# Patient Record
Sex: Female | Born: 1955 | Race: White | Hispanic: No | Marital: Single | Smoking: Current every day smoker
Health system: Southern US, Community
[De-identification: ages and names within clinical notes are randomized; demographics above are authoritative.]

## PROBLEM LIST (undated history)

## (undated) DIAGNOSIS — I890 Lymphedema, not elsewhere classified: Secondary | ICD-10-CM

## (undated) DIAGNOSIS — E785 Hyperlipidemia, unspecified: Secondary | ICD-10-CM

## (undated) DIAGNOSIS — I10 Essential (primary) hypertension: Secondary | ICD-10-CM

## (undated) DIAGNOSIS — C50919 Malignant neoplasm of unspecified site of unspecified female breast: Secondary | ICD-10-CM

## (undated) HISTORY — PX: BREAST LUMPECTOMY: SHX2

## (undated) HISTORY — DX: Malignant neoplasm of unspecified site of unspecified female breast: C50.919

## (undated) HISTORY — DX: Hyperlipidemia, unspecified: E78.5

## (undated) HISTORY — PX: TOTAL ABDOMINAL HYSTERECTOMY: SHX209

## (undated) HISTORY — DX: Essential (primary) hypertension: I10

## (undated) HISTORY — DX: Lymphedema, not elsewhere classified: I89.0

---

## 2008-04-04 ENCOUNTER — Ambulatory Visit: Payer: Self-pay | Admitting: Obstetrics & Gynecology

## 2008-04-26 DIAGNOSIS — C50919 Malignant neoplasm of unspecified site of unspecified female breast: Secondary | ICD-10-CM

## 2008-04-26 HISTORY — DX: Malignant neoplasm of unspecified site of unspecified female breast: C50.919

## 2008-09-10 ENCOUNTER — Encounter: Payer: Self-pay | Admitting: Cardiovascular Disease

## 2008-10-03 ENCOUNTER — Encounter: Payer: Self-pay | Admitting: Cardiovascular Disease

## 2008-12-06 ENCOUNTER — Encounter: Payer: Self-pay | Admitting: Cardiovascular Disease

## 2009-02-24 ENCOUNTER — Ambulatory Visit: Payer: Self-pay | Admitting: Radiation Oncology

## 2009-03-13 ENCOUNTER — Ambulatory Visit: Payer: Self-pay | Admitting: Radiation Oncology

## 2009-03-26 ENCOUNTER — Ambulatory Visit: Payer: Self-pay | Admitting: Radiation Oncology

## 2009-04-26 ENCOUNTER — Ambulatory Visit: Payer: Self-pay | Admitting: Radiation Oncology

## 2009-05-27 ENCOUNTER — Ambulatory Visit: Payer: Self-pay | Admitting: Radiation Oncology

## 2009-05-27 ENCOUNTER — Telehealth: Payer: Self-pay | Admitting: Cardiovascular Disease

## 2009-06-09 ENCOUNTER — Encounter: Payer: Self-pay | Admitting: Cardiovascular Disease

## 2009-06-11 ENCOUNTER — Ambulatory Visit: Payer: Self-pay | Admitting: Cardiovascular Disease

## 2009-06-11 DIAGNOSIS — I10 Essential (primary) hypertension: Secondary | ICD-10-CM | POA: Insufficient documentation

## 2009-06-11 DIAGNOSIS — R000 Tachycardia, unspecified: Secondary | ICD-10-CM | POA: Insufficient documentation

## 2009-06-23 ENCOUNTER — Ambulatory Visit: Payer: Self-pay | Admitting: Cardiovascular Disease

## 2009-06-24 ENCOUNTER — Ambulatory Visit: Payer: Self-pay | Admitting: Radiation Oncology

## 2009-07-02 ENCOUNTER — Telehealth: Payer: Self-pay | Admitting: Cardiovascular Disease

## 2009-07-02 LAB — CONVERTED CEMR LAB
ALT: 30 units/L (ref 0–35)
AST: 23 units/L (ref 0–37)
Albumin: 4.7 g/dL (ref 3.5–5.2)
Alkaline Phosphatase: 62 units/L (ref 39–117)
BUN: 15 mg/dL (ref 6–23)
CO2: 24 meq/L (ref 19–32)
Calcium: 9.5 mg/dL (ref 8.4–10.5)
Chloride: 104 meq/L (ref 96–112)
Cholesterol: 180 mg/dL (ref 0–200)
Creatinine, Ser: 0.56 mg/dL (ref 0.40–1.20)
Glucose, Bld: 99 mg/dL (ref 70–99)
HDL: 37 mg/dL — ABNORMAL LOW (ref >39)
Potassium: 3.8 meq/L (ref 3.5–5.3)
Sodium: 141 meq/L (ref 135–145)
Total Bilirubin: 0.3 mg/dL (ref 0.3–1.2)
Total CHOL/HDL Ratio: 4.9
Total Protein: 6.9 g/dL (ref 6.0–8.3)
Triglycerides: 405 mg/dL — ABNORMAL HIGH (ref <150)

## 2009-07-25 ENCOUNTER — Ambulatory Visit: Payer: Self-pay | Admitting: Radiation Oncology

## 2009-09-15 ENCOUNTER — Ambulatory Visit: Payer: Self-pay | Admitting: Cardiovascular Disease

## 2009-09-15 DIAGNOSIS — R079 Chest pain, unspecified: Secondary | ICD-10-CM | POA: Insufficient documentation

## 2009-10-07 ENCOUNTER — Telehealth: Payer: Self-pay | Admitting: Cardiovascular Disease

## 2009-10-22 ENCOUNTER — Telehealth: Payer: Self-pay | Admitting: Cardiovascular Disease

## 2009-11-24 ENCOUNTER — Ambulatory Visit: Payer: Self-pay | Admitting: Radiation Oncology

## 2009-12-02 ENCOUNTER — Ambulatory Visit: Payer: Self-pay | Admitting: Radiation Oncology

## 2009-12-25 ENCOUNTER — Ambulatory Visit: Payer: Self-pay | Admitting: Radiation Oncology

## 2010-03-23 ENCOUNTER — Ambulatory Visit: Payer: Self-pay | Admitting: Cardiovascular Disease

## 2010-03-24 ENCOUNTER — Ambulatory Visit: Payer: Self-pay | Admitting: Cardiovascular Disease

## 2010-03-24 LAB — CONVERTED CEMR LAB
Cholesterol: 152 mg/dL (ref 0–200)
HDL: 33 mg/dL — ABNORMAL LOW (ref >39)
Total CHOL/HDL Ratio: 4.6
Triglycerides: 501 mg/dL — ABNORMAL HIGH (ref <150)

## 2010-05-26 NOTE — Assessment & Plan Note (Signed)
Summary: F/U 6 MONTHS   Visit Type:  New Patient  CC:  with chemo had chest pains and nothing to be concerned about.. no sob.. no edema. .  History of Present Illness: Lauren Cervantes is a very pleasant 55 year old woman, previously seen in December 09I myself at [facility], who correlates to have a primary care physician, with a history of hypertension, hyperlipidemia/hypertriglyceridemia, breast cancer and chemotherapy with radiation who presents to establish care.  Ms. Longnecker states that overall she's been doing well. In February 2000 and she was diagnosed with breast cancer. She underwent chemotherapy and 36 cycles of radiation on the left side of her chest. She states having most of her treatment at [facility]. There she had an echocardiogram prior to her treatment. She states that she's had recurrence of a peptic ulcer. She is on steroids for her as reasons including weight gain and stopped 2 months ago. She has noticed an elevated heart rate over the past several months. She denies any significant shortness of breath, lower extremity edema and otherwise feels well. She continues to take Crestor daily.  Preventive Screening-Counseling & Management  Alcohol-Tobacco     Alcohol drinks/day: 1     Smoking Status: current     Packs/Day: 3-4 cig a day  Caffeine-Diet-Exercise     Caffeine use/day: 1 cup     Does Patient Exercise: yes  Current Problems (verified): 1)  Hyperlipidemia, Mixed  (ICD-272.2) 2)  Essential Hypertension, Malignant  (ICD-401.0)  Current Medications (verified): 1)  Crestor 40 Mg Tabs (Rosuvastatin Calcium) .... 1/2 Tablet By Mouth Daily. 2)  Diovan 160 Mg Tabs (Valsartan) .... Take One Tablet By Mouth Daily 3)  Triamterene-Hctz 37.5-25 Mg Tabs (Triamterene-Hctz) .Marland Kitchen.. 1 By Mouth Once Daily 4)  Nexium 40 Mg Cpdr (Esomeprazole Magnesium) .Marland Kitchen.. 1 By Mouth Once Daily 5)  Anastrozole 1 Mg Tabs (Anastrozole) .Marland Kitchen.. 1 By Mouth Once  Daily - Start Next Week 6)  Calcium 1000mg  With Vitamin D 400mg  .... 1 By Mouth Once Daily 7)  Omega-3 Epa Fish Oil 1205 Mg Caps (Omega-3 Fatty Acids) .Marland Kitchen.. 1 By Mouth Once Daily 8)  Daily Multi  Tabs (Multiple Vitamins-Minerals) .Marland Kitchen.. 1 By Mouth Once Daily  Allergies (verified): No Known Drug Allergies  Past History:  Past Medical History: breast cancer 2010 hyperlipidemia hypertension  Past Surgical History: Abdominal Hysterectomy-Total breast cancer   Social History: Alcohol drinks/day:  1 Smoking Status:  current Packs/Day:  3-4 cig a day Caffeine use/day:  1 cup Does Patient Exercise:  yes  Review of Systems  The patient denies anorexia, fever, weight loss, weight gain, vision loss, decreased hearing, hoarseness, chest pain, syncope, dyspnea on exertion, peripheral edema, prolonged cough, headaches, hemoptysis, abdominal pain, melena, hematochezia, severe indigestion/heartburn, hematuria, incontinence, genital sores, muscle weakness, suspicious skin lesions, transient blindness, difficulty walking, depression, unusual weight change, abnormal bleeding, enlarged lymph nodes, angioedema, breast masses, and testicular masses.     Vital Signs:  Patient profile:   55 year old female Height:      66 inches Weight:      170.25 pounds BMI:     27.58 Pulse rate:   94 / minute Pulse rhythm:   regular BP sitting:   126 / 76  (right arm) Cuff size:   regular  Vitals Entered By: Mercer Pod (June 11, 2009 12:09 PM)   EKG  Procedure date:  06/11/2009  Findings:      normal sinus rhythm with a rate of 94 beats  per minute, no significant ST or T wave changes noted.  Impression & Recommendations:  Problem # 1:  HYPERLIPIDEMIA, MIXED (ICD-272.2) discharge has a history of very elevated triglycerides previously. We will try to obtain a cholesterol panel in the next week. We'll call her with the results. She also takes fish oil daily.  Her updated medication list for  this problem includes:    Crestor 40 Mg Tabs (Rosuvastatin calcium) .Marland Kitchen... 1/2 tablet by mouth daily.  Problem # 2:  ESSENTIAL HYPERTENSION, MALIGNANT (ICD-401.0) Blood pressure today is well controlled. He did mention to her that we could change her Diovan to losartan. She preferred to wait until the summer before she changes any of her medications.  Her updated medication list for this problem includes:    Diovan 160 Mg Tabs (Valsartan) .Marland Kitchen... Take one tablet by mouth daily    Triamterene-hctz 37.5-25 Mg Tabs (Triamterene-hctz) .Marland Kitchen... 1 by mouth once daily  Problem # 3:  TACHYCARDIA (ICD-785) Heart rate is elevated on today's visit and she states that it is typically been very elevated, particularly with exertion. Asked her to watch her heart rate, work on some light exercise. If her rate continues to be elevated, we could start a low-dose beta blocker. She will for 2 week before adding any additional medication.  We'll try to set her primary care physician as she does not have one at this time.  Patient Instructions: 1)  Your physician recommends that you schedule a follow-up appointment in: 6 months 2)  Your physician recommends that you return for a FASTING lipid profile: at your earliest convenience. 3)  Your physician recommends that you continue on your current medications as directed. Please refer to the Current Medication list given to you today. Prescriptions: TRIAMTERENE-HCTZ 37.5-25 MG TABS (TRIAMTERENE-HCTZ) 1 by mouth once daily  #30 x 6   Entered by:   Charlena Cross, RN, BSN   Authorized by:   Dossie Arbour MD   Signed by:   Charlena Cross, RN, BSN on 06/11/2009   Method used:   Electronically to        [facility]. [phone]* (retail)       [address]       [place], [place]  [address]       Ph: [phone]       Fax: [phone]   RxID:   [id number] DIOVAN 160 MG TABS (VALSARTAN) Take one tablet by mouth daily  #30 x 6   Entered by:    Charlena Cross, RN, BSN   Authorized by:   Dossie Arbour MD   Signed by:   Charlena Cross, RN, BSN on 06/11/2009   Method used:   Electronically to        [facility]. [phone]* (retail)       [address]       [place], [place]  [address]       Ph: [phone]       Fax: [phone]   RxID:   [id number] CRESTOR 40 MG TABS (ROSUVASTATIN CALCIUM) 1/2 tablet by mouth daily.  #30 x 6   Entered by:   Charlena Cross, RN, BSN   Authorized by:   Dossie Arbour MD   Signed by:   Charlena Cross, RN, BSN on 06/11/2009   Method used:   Electronically to  [address]  [place]. [phone]* (retail)       [address]       [place], [place]  [address]       Ph: [phone]       Fax: [phone]   RxID:   [id number]

## 2010-05-26 NOTE — Letter (Signed)
Summary: General Surgery Clinic Note from [facility]  General Surgery Clinic Note from [facility]   Imported By: Harlon Flor 09/30/2009 15:38:12  _____________________________________________________________________  External Attachment:    Type:   Image     Comment:   External Document

## 2010-05-26 NOTE — Progress Notes (Signed)
Summary: RX Zetia & Dystolic  Phone Note Refill Request Call back at Home Phone [phone] Message from:  Patient on October 07, 2009 8:43 AM  Refills Requested: Medication #1:  ZETIA 10 MG TABS Take one tablet by mouth daily..  Medication #2:  BYSTOLIC 5 MG TABS Take 1 tablet by mouth once a day [facility] [place] [address]-WOULD LIKE TO HAVE A 90 DAY RX  Initial call taken by: Harlon Flor,  October 07, 2009 8:44 AM    Prescriptions: ZETIA 10 MG TABS (EZETIMIBE) Take one tablet by mouth daily.  #30 x 6   Entered by:   Bishop Dublin, CMA   Authorized by:   Dossie Arbour MD   Signed by:   Bishop Dublin, CMA on 10/07/2009   Method used:   Electronically to        [facility]. [phone]* (retail)       [address]       [place], [place]  [address]       Ph: [phone]       Fax: [phone]   RxID:   [id number] BYSTOLIC 5 MG TABS (NEBIVOLOL HCL) Take 1 tablet by mouth once a day  #90 x 3   Entered by:   Bishop Dublin, CMA   Authorized by:   Dossie Arbour MD   Signed by:   Bishop Dublin, CMA on 10/07/2009   Method used:   Electronically to        [facility]. [phone]* (retail)       [address]       [place], [place]  [address]       Ph: [phone]       Fax: [phone]   RxID:   [id number]

## 2010-05-26 NOTE — Assessment & Plan Note (Signed)
Summary: F6M/GLC   Visit Type:  Follow-up Primary Provider:  none  CC:  denies chest pain, SOB, and or palpitations..  History of Present Illness: Lauren Cervantes is a very pleasant 55 year old woman, with a history of hypertension, hyperlipidemia/hypertriglycerishe was seen for tachycardia and chest discomfortdemia, breast cancer and chemotherapy with radiation  to the left  in 2010 who presents for follow up. previous, she was seen for tachycardia and chest discomfort.  overall, she reports that she is doing well. She takes bystolic 5 mg daily and has no complaints. She is starting to use her treadmill again. she denies tachycardia or significant shortness of breath with exertion.   In February 2010 and she was diagnosed with breast cancer. She underwent chemotherapy and 36 cycles of radiation on the left side of her chest. She states having most of her treatment at [facility]. There she had an echocardiogram prior to her treatment. She states that she's had recurrence of a peptic ulcer.    EKG shows normal sinus rhythm with rate 74 beats per minute, no significant ST-T wave changes  Current Medications (verified): 1)  Crestor 20 Mg Tabs (Rosuvastatin Calcium) .Marland Kitchen.. 1 At Bedtime 2)  Diovan 160 Mg Tabs (Valsartan) .... Take One Tablet By Mouth Daily 3)  Triamterene-Hctz 37.5-25 Mg Tabs (Triamterene-Hctz) .Marland Kitchen.. 1 By Mouth Once Daily 4)  Nexium 40 Mg Cpdr (Esomeprazole Magnesium) .... As Needed 5)  Anastrozole 1 Mg Tabs (Anastrozole) .Marland Kitchen.. 1 By Mouth Once Daily - Start Next Week 6)  Calcium 1000mg  With Vitamin D 400mg  .... 1 By Mouth Once Daily 7)  Omega-3 Epa Fish Oil 1205 Mg Caps (Omega-3 Fatty Acids) .Marland Kitchen.. 1 By Mouth Once Daily 8)  Daily Multi  Tabs (Multiple Vitamins-Minerals) .Marland Kitchen.. 1 By Mouth Once Daily 9)  Bystolic 5 Mg Tabs (Nebivolol Hcl) .... Take 1 Tablet By Mouth Once A Day 10)  Zetia 10 Mg Tabs (Ezetimibe) .... Take One Tablet By Mouth Daily.  Allergies (verified): No Known  Drug Allergies  Past History:  Past Medical History: Last updated: 06/11/2009 breast cancer 2010 hyperlipidemia hypertension  Past Surgical History: Last updated: 06/11/2009 Abdominal Hysterectomy-Total breast cancer   Family History: Last updated: 09/15/2009 Family History of Cancer:  Family History of Coronary Artery Disease:  Family History of CVA or Stroke:  Family History of Hyperlipidemia:  Family History of Hypertension:   Social History: Last updated: 09/15/2009 Full Time -- office manager & ins agent Single  Tobacco Use - Yes.  Alcohol Use - yes -- 1 a day most days Regular Exercise - yes -- getting back into after cancer Drug Use - no  Risk Factors: Alcohol Use: 1 (06/11/2009) Caffeine Use: 1 cup (06/11/2009) Exercise: yes (09/15/2009)  Risk Factors: Smoking Status: current (09/15/2009) Packs/Day: 3-4 cig a day (06/11/2009)  Review of Systems  The patient denies fever, weight loss, weight gain, vision loss, decreased hearing, hoarseness, chest pain, syncope, dyspnea on exertion, peripheral edema, prolonged cough, abdominal pain, incontinence, muscle weakness, depression, and enlarged lymph nodes.    Vital Signs:  Patient profile:   55 year old female Height:      66 inches Weight:      171.50 pounds BMI:     27.78 Pulse rate:   74 / minute BP sitting:   118 / 68  (left arm) Cuff size:   regular  Vitals Entered By: Bishop Dublin, CMA (March 24, 2010 12:06 PM)  Physical Exam  General:  well-appearing woman in no apparent distress, HEENT  exam is benign, oropharynx is clear, neck is supple with no JVP or carotid bruits, heart sounds are regular with S1-S2 and no murmurs appreciated, lungs are clear to auscultation with no wheezes or rales, abdominal exam is benign, no significant lower extremity edema, neurologic exam is nonfocal skin is warm and dry, pulses are equal and symmetrical in her upper and lower extremities. Skin:  Intact without lesions  or rashes. Psych:  Normal affect.   Impression & Recommendations:  Problem # 1:  TACHYCARDIA (ICD-785) heart rate is significantly improved on beta blockers. He is uncertain if she will continue to need the beta blocker. I suggested she could try to wean herself off the beta blocker will monitor her heart rate.  Problem # 2:  CHEST PAIN-UNSPECIFIED (ICD-786.50) No further episodes of chest pain reported. No further workup at this time.  Her updated medication list for this problem includes:    Bystolic 5 Mg Tabs (Nebivolol hcl) .Marland Kitchen... Take 1 tablet by mouth once a day  Problem # 3:  HYPERLIPIDEMIA, MIXED (ICD-272.2) Cholesterol has significantly improved. Triglycerides continued to be elevated. She will try Krill oil.   Her updated medication list for this problem includes:    Crestor 20 Mg Tabs (Rosuvastatin calcium) .Marland Kitchen... 1 at bedtime    Zetia 10 Mg Tabs (Ezetimibe) .Marland Kitchen... Take one tablet by mouth daily.  Patient Instructions: 1)  Your physician recommends that you schedule a follow-up appointment in: 1 year 2)  Your physician has recommended you make the following change in your medication: Try weaning off Bystolic, while monitoring HR.  Change Fish oil to [place].

## 2010-05-26 NOTE — Medication Information (Signed)
Summary: Med List from [facility]  Med List from [facility]   Imported By: Harlon Flor 09/30/2009 15:35:50  _____________________________________________________________________  External Attachment:    Type:   Image     Comment:   External Document

## 2010-05-26 NOTE — Letter (Signed)
Summary: Medical Record Release  Medical Record Release   Imported By: Harlon Flor 03/04/2010 16:15:46  _____________________________________________________________________  External Attachment:    Type:   Image     Comment:   External Document

## 2010-05-26 NOTE — Progress Notes (Signed)
Summary: PHI  PHI   Imported By: Harlon Flor 06/12/2009 10:19:17  _____________________________________________________________________  External Attachment:    Type:   Image     Comment:   External Document

## 2010-05-26 NOTE — Progress Notes (Signed)
Summary: Refill on HCTZ  Phone Note Refill Request Call back at Home Phone [phone] Message from:  Patient on May 27, 2009 10:40 AM  Refills Requested: Medication #1:  HCTZ 37.5-25mg  Patient has appt. with Dr. Mariah Milling on Feb. 16th, but will run out of this medication before then and needs a refill.  Please call into [facility]  Caller: Patient  Follow-up for Phone Call        Left message to call. Charlena Cross RN BSN   attempted to contact. Charlena Cross RN BSN

## 2010-05-26 NOTE — Progress Notes (Signed)
Summary: medication refill   Phone Note Refill Request   Refills Requested: Medication #1:  CRESTOR 40 MG TABS 1/2 tablet by mouth daily.  Medication #2:  DIOVAN 160 MG TABS Take one tablet by mouth daily  Medication #3:  TRIAMTERENE-HCTZ 37.5-25 MG TABS 1 by mouth once daily  Medication #4:  BYSTOLIC 5 MG TABS Take 1 tablet by mouth once a day All of the above plus Zetia called to [facility] [place] with a 90 day supply on all.  Would also like the Crestor to be the correct dose of 20mg . pt number [phone]  Initial call taken by: Park Breed,  October 22, 2009 1:25 PM    New/Updated Medications: CRESTOR 20 MG TABS (ROSUVASTATIN CALCIUM) 1 at bedtime Prescriptions: ZETIA 10 MG TABS (EZETIMIBE) Take one tablet by mouth daily.  #90 x 3   Entered by:   Bishop Dublin, CMA   Authorized by:   Dossie Arbour MD   Signed by:   Bishop Dublin, CMA on 10/22/2009   Method used:   Electronically to        [facility]. [phone]* (retail)       [address]       [place], [place]  [address]       Ph: [phone]       Fax: [phone]   RxID:   [id number] BYSTOLIC 5 MG TABS (NEBIVOLOL HCL) Take 1 tablet by mouth once a day  #90 x 3   Entered by:   Bishop Dublin, CMA   Authorized by:   Dossie Arbour MD   Signed by:   Bishop Dublin, CMA on 10/22/2009   Method used:   Electronically to        [facility]. [phone]* (retail)       [address]       [place], [place]  [address]       Ph: [phone]       Fax: [phone]   RxID:   [id number] TRIAMTERENE-HCTZ 37.5-25 MG TABS (TRIAMTERENE-HCTZ) 1 by mouth once daily  #90 x 3   Entered by:   Bishop Dublin, CMA   Authorized by:   Dossie Arbour MD   Signed by:   Bishop Dublin, CMA on 10/22/2009   Method used:   Electronically to        [facility]. [phone]* (retail)       [address]       [place], [place]  [address]       Ph: [phone]       Fax: [phone]   RxID:    [id number] DIOVAN 160 MG TABS (VALSARTAN) Take one tablet by mouth daily  #90 x 3   Entered by:   Bishop Dublin, CMA   Authorized by:   Dossie Arbour MD   Signed by:   Bishop Dublin, CMA on 10/22/2009   Method used:   Electronically to        [facility]. [phone]* (retail)       [address]       [place], [place]  [address]       Ph: [phone]       Fax: [phone]   RxID:  [id number] CRESTOR 20 MG TABS (ROSUVASTATIN CALCIUM) 1 at bedtime  #90 x 3   Entered by:   Bishop Dublin, CMA   Authorized by:   Dossie Arbour MD   Signed by:   Bishop Dublin, CMA on 10/22/2009   Method used:   Electronically to        [facility]. [phone]* (retail)       [address]       [place], [place]  [address]       Ph: [phone]       Fax: [phone]   RxID:   [id number]

## 2010-05-26 NOTE — Assessment & Plan Note (Signed)
Summary: CHEST TENDERNESS   Visit Type:  Initial Consult Primary Provider:  none  CC:  possible pulled muscle in chest.  History of Present Illness: [name] is a very pleasant 55 year old woman, with a history of hypertension, hyperlipidemia/hypertriglyceridemia, breast cancer and chemotherapy with radiation in 2010 who presents for follow up. she has baseline elevated heart rate since her treatment for breast cancer. She presents for new episodes of chest pain.  She states that any past week, she was working in her garden. The next day or 2 after her exertion, she developed left-sided chest pain that she noticed when she coughed. Certain movements also reproduce the pain. It did not seem to come on with exertion.  she describes the pain as a sharp pain and she tries to reproduce the sensation of her fingers pushing on her chest. She denies any significant shortness of breath, lower extremity edema and otherwise feels well. She continues to take Crestor daily.   In February 2010 and she was diagnosed with breast cancer. She underwent chemotherapy and 36 cycles of radiation on the left side of her chest. She states having most of her treatment at [facility]. There she had an echocardiogram prior to her treatment. She states that she's had recurrence of a peptic ulcer.    Preventive Screening-Counseling & Management  Alcohol-Tobacco     Smoking Status: current  Caffeine-Diet-Exercise     Does Patient Exercise: yes      Drug Use:  no.    Current Medications (verified): 1)  Crestor 40 Mg Tabs (Rosuvastatin Calcium) .... 1/2 Tablet By Mouth Daily. 2)  Diovan 160 Mg Tabs (Valsartan) .... Take One Tablet By Mouth Daily 3)  Triamterene-Hctz 37.5-25 Mg Tabs (Triamterene-Hctz) .Marland Kitchen.. 1 By Mouth Once Daily 4)  Nexium 40 Mg Cpdr (Esomeprazole Magnesium) .Marland Kitchen.. 1 By Mouth Once Daily 5)  Anastrozole 1 Mg Tabs (Anastrozole) .Marland Kitchen.. 1 By Mouth Once Daily - Start Next Week 6)  Calcium 1000mg   With Vitamin D 400mg  .... 1 By Mouth Once Daily 7)  Omega-3 Epa Fish Oil 1205 Mg Caps (Omega-3 Fatty Acids) .Marland Kitchen.. 1 By Mouth Once Daily 8)  Daily Multi  Tabs (Multiple Vitamins-Minerals) .Marland Kitchen.. 1 By Mouth Once Daily 9)  Grape Seed Extract 100 Mg Caps (Grape Seed) .... 2 Daily  Allergies (verified): No Known Drug Allergies  Past History:  Past Medical History: Last updated: 06/11/2009 breast cancer 2010 hyperlipidemia hypertension  Past Surgical History: Last updated: 06/11/2009 Abdominal Hysterectomy-Total breast cancer   Risk Factors: Alcohol Use: 1 (06/11/2009) Caffeine Use: 1 cup (06/11/2009) Exercise: yes (09/15/2009)  Risk Factors: Smoking Status: current (09/15/2009) Packs/Day: 3-4 cig a day (06/11/2009)  Family History: Family History of Cancer:  Family History of Coronary Artery Disease:  Family History of CVA or Stroke:  Family History of Hyperlipidemia:  Family History of Hypertension:   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  Alcohol Use - yes -- 1 a day most days Regular Exercise - yes -- getting back into after cancer Drug Use - no Drug Use:  no  Review of Systems       The patient complains of chest pain.  The patient denies fever, weight loss, weight gain, vision loss, decreased hearing, hoarseness, syncope, dyspnea on exertion, peripheral edema, prolonged cough, abdominal pain, incontinence, muscle weakness, depression, and enlarged lymph nodes.    Vital Signs:  Patient profile:   55 year old female Height:  66 inches Weight:      171 pounds BMI:     27.70 Pulse rate:   96 / minute BP sitting:   140 / 92  (left arm) Cuff size:   regular  Vitals Entered By: Hardin Negus, RMA (Sep 15, 2009 3:21 PM)  Physical Exam  General:  well-appearing woman in no apparent distress, HEENT exam is benign, oropharynx is clear, neck is supple with no JVP or carotid bruits, heart sounds are regular with S1-S2  and no murmurs appreciated, lungs are clear to auscultation with no wheezes or rales, abdominal exam is benign, no significant lower extremity edema, neurologic exam is nonfocal skin is warm and dry, pulses are equal and symmetrical in her upper and lower extremities.   EKG  Procedure date:  09/15/2009  Findings:      normal sinus rhythm with rate 96 beats per minute, no significant ST-T wave changes.  Impression & Recommendations:  Problem # 1:  CHEST PAIN-UNSPECIFIED (ICD-786.50) Assessment Unchanged etiology of her chest pain is likely due to muscular ligamental strain. She had been working in the garden and had symptoms the next day or 2. It was not reproducible with exertion and only with coughing and moving.  I suggest that we watch her for now, she did try NSAIDs.  Her updated medication list for this problem includes:    Bystolic 5 Mg Tabs (Nebivolol hcl) .Marland Kitchen... Take 1 tablet by mouth once a day  Problem # 2:  TACHYCARDIA (ICD-785) For her elevated heart rate, I suggested that we try a low-dose beta blocker such as bystolic 5 mg daily. Alternatively we could use a generic metoprolol 25 mg by mouth two times a day.   Problem # 3:  HYPERLIPIDEMIA, MIXED (ICD-272.2) she has been tolerating Crestor well. She has a very strong family history also a strong stroke history. She's had radiation on the left, she continues to smoke 3-4 cigarettes per day. We have advised her to quit smoking, continue her aspirin and Crestor. Study a with a goal LDL of 70.  Her updated medication list for this problem includes:    Crestor 40 Mg Tabs (Rosuvastatin calcium) .Marland Kitchen... 1/2 tablet by mouth daily.    Zetia 10 Mg Tabs (Ezetimibe) .Marland Kitchen... Take one tablet by mouth daily.  Problem # 4:  ESSENTIAL HYPERTENSION, MALIGNANT (ICD-401.0) Blood pressure is borderline elevated we will continue to watch this closely. This may improve slightly on low-dose beta blocker.  Her updated medication list for this  problem includes:    Diovan 160 Mg Tabs (Valsartan) .Marland Kitchen... Take one tablet by mouth daily    Triamterene-hctz 37.5-25 Mg Tabs (Triamterene-hctz) .Marland Kitchen... 1 by mouth once daily    Bystolic 5 Mg Tabs (Nebivolol hcl) .Marland Kitchen... Take 1 tablet by mouth once a day   Patient Instructions: 1)  Your physician recommends that you schedule a follow-up appointment in: 6 months 2)  Your physician has recommended you make the following change in your medication: Start taking Bystolic 5mg  daily for rapid heartrate. Start taking Zetia 10mg  daily.   Prescriptions: ZETIA 10 MG TABS (EZETIMIBE) Take one tablet by mouth daily.  #30 x 6   Entered by:   Cloyde Reams RN   Authorized by:   Dossie Arbour MD   Signed by:   Cloyde Reams RN on 09/15/2009   Method used:   Electronically to        [facility]. [phone]* (retail)       [phone]  [address]       [place], [place]  [address]       Ph: [phone]       Fax: [phone]   RxID:   [id number]

## 2010-05-26 NOTE — Progress Notes (Signed)
Summary: PCP  Phone Note Outgoing Call   Summary of Call: made appt with Dr. Dan Humphreys for PCP care per Dr. Ethelene Hal request-  Appt made with Dr. Dan Humphreys at [facility].  Called to inform pt of appt date and time and she does not want to go to [facility] (issue with Dr. Sullivan Lone that she would not discuss).  Pt will find PCP on her own. Initial call taken by: Charlena Cross, RN, BSN,  July 02, 2009 9:50 AM

## 2010-09-08 NOTE — Assessment & Plan Note (Signed)
NAME:  Lauren Cervantes, Lauren Cervantes NO.:  [id number]   MEDICAL RECORD NO.:  [id number]          PATIENT TYPE:  POB   LOCATION:  [facility] at [facility]         FACILITY:  [facility]   PHYSICIAN:  Allie Bossier, MD        DATE OF BIRTH:  Jun 25, 1955   DATE OF SERVICE:                                  CLINIC NOTE   [name] is a 55 year old divorced white gravida 2, para 2, abortus  1 with twin 36 year old daughters and one 36-year-old granddaughter.  She  comes in here for her annual exam.  She has no GYN complaints today.  She just wishes a prescription for Evista.  She gets it 3 months at that  time through mail order.   MEDICATIONS:  1. Hydrochlorothiazide 25 mg daily.  2. Diovan 1 daily.  3. Crestor 1 daily.  4. Evista 60 mg daily.  5. Calcium 1000 mg daily.  6. Multivitamin 1 daily.   PAST MEDICAL HISTORY:  She is a overweight.  She has hypertension, high  triglycerides, and osteopenia.   REVIEW OF SYSTEMS:  Her bone density was done in 2008.  Mammogram was  January 2009 and she has not had her colonoscopy to date.  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  She is monogamous for the last 20 years.  She complains of  decreased libido.  She says that she tried testosterone cream in the  past with no help.   PAST SURGICAL HISTORY:  TAH-BSO and a tonsillectomy in the past.   FAMILY HISTORY:  Positive for breast cancer in her mother.  She was  diagnosed at 65 years old and deceased at 55 years of age.  She denies  family history of GYN and colon malignancies.   ALLERGIES:  No known drug allergies.  No latex allergies.   SOCIAL HISTORY:  She is a fourth pack a day smoker for 40 years and she  drinks an alcoholic beverage each evening.   PHYSICAL EXAMINATION:  VITAL SIGNS:  Weight 165, height 5 feet 6 inches,  blood pressure 158/100, and pulse 82.  HEENT:  Normal.  BREAST:  Normal bilaterally.  HEART:  Regular rate and  rhythm without murmurs, rubs, or gallops.  LUNGS:  Clear to auscultation bilaterally.  ABDOMEN:  Moderately obese.  No hepatosplenomegaly palpable.  EXTERNAL GENITALIA:  No lesions.  Cuff normal.  Vagina normal.  Mild-to-  moderate amount of atrophy noted.   ASSESSMENT AND PLAN:  1. Annual exam.  I have recommended self-breast and self-vulvar exams      monthly.  I recommended weight loss.  2. I have given her refill for her Evista and scheduled a mammogram      for next month.      Allie Bossier, MD     MCD/MEDQ  D:  04/04/2008  T:  04/04/2008  Job:  [id number]

## 2010-10-12 ENCOUNTER — Encounter: Payer: Self-pay | Admitting: Cardiovascular Disease

## 2010-10-12 ENCOUNTER — Other Ambulatory Visit: Payer: Self-pay

## 2010-10-12 MED ORDER — ROSUVASTATIN CALCIUM 20 MG PO TABS: 20.0000 mg | ORAL_TABLET | Freq: Every day | ORAL | Status: DC

## 2010-10-12 MED ORDER — TRIAMTERENE-HCTZ 37.5-25 MG PO CAPS: 1.0000 | ORAL_CAPSULE | ORAL | Status: DC

## 2010-10-12 MED ORDER — EZETIMIBE 10 MG PO TABS: 10.0000 mg | ORAL_TABLET | Freq: Every day | ORAL | Status: DC

## 2010-10-12 MED ORDER — VALSARTAN 160 MG PO TABS: 160.0000 mg | ORAL_TABLET | Freq: Every day | ORAL | Status: DC

## 2010-10-12 MED ORDER — NEBIVOLOL HCL 5 MG PO TABS: 5.0000 mg | ORAL_TABLET | Freq: Every day | ORAL | Status: DC

## 2010-10-12 NOTE — Telephone Encounter (Signed)
Needs a refill for zetia 10 mg take one tablet daily with 90 day supply and 3 refills.

## 2010-10-12 NOTE — Telephone Encounter (Signed)
Request a refill for bystolic 5 mg take one tablet daily with 90 day supply with 3 refills.

## 2010-11-17 ENCOUNTER — Encounter: Payer: Self-pay | Admitting: Cardiology

## 2011-05-12 ENCOUNTER — Encounter: Payer: Self-pay | Admitting: Cardiovascular Disease

## 2011-05-12 ENCOUNTER — Ambulatory Visit (INDEPENDENT_AMBULATORY_CARE_PROVIDER_SITE_OTHER): Payer: BC Managed Care – PPO | Admitting: Cardiovascular Disease

## 2011-05-12 DIAGNOSIS — E785 Hyperlipidemia, unspecified: Secondary | ICD-10-CM

## 2011-05-12 DIAGNOSIS — R0989 Other specified symptoms and signs involving the circulatory and respiratory systems: Secondary | ICD-10-CM

## 2011-05-12 DIAGNOSIS — I10 Essential (primary) hypertension: Secondary | ICD-10-CM

## 2011-05-12 DIAGNOSIS — E782 Mixed hyperlipidemia: Secondary | ICD-10-CM

## 2011-05-12 MED ORDER — VALSARTAN 80 MG PO TABS: 80.0000 mg | ORAL_TABLET | Freq: Every day | ORAL | Status: DC

## 2011-05-12 MED ORDER — EZETIMIBE 10 MG PO TABS: 10.0000 mg | ORAL_TABLET | Freq: Every day | ORAL | Status: DC

## 2011-05-12 MED ORDER — ROSUVASTATIN CALCIUM 20 MG PO TABS: 20.0000 mg | ORAL_TABLET | Freq: Every day | ORAL | Status: DC

## 2011-05-12 MED ORDER — NEBIVOLOL HCL 5 MG PO TABS: 5.0000 mg | ORAL_TABLET | Freq: Every day | ORAL | Status: DC

## 2011-05-12 MED ORDER — TRIAMTERENE-HCTZ 37.5-25 MG PO CAPS: 1.0000 | ORAL_CAPSULE | ORAL | Status: DC

## 2011-05-12 NOTE — Assessment & Plan Note (Signed)
We have suggested that she restart Her bystolic 5 mg daily. It would be okay to titrate this to 10 mg as needed.

## 2011-05-12 NOTE — Assessment & Plan Note (Signed)
Blood pressure is well controlled. We have suggested she in half, closely monitor her blood pressure, And possibly hold the medication if blood pressure numbers are adequate.

## 2011-05-12 NOTE — Patient Instructions (Signed)
You are doing well. No medication changes were made.  Please call us if you have new issues that need to be addressed before your next appt.  Your physician wants you to follow-up in: 6 months.  You will receive a reminder letter in the mail two months in advance. If you don't receive a letter, please call our office to schedule the follow-up appointment.   

## 2011-05-12 NOTE — Progress Notes (Signed)
Patient ID: Lauren Cervantes, female    DOB: 15-Dec-1955, 56 y.o.   MRN: [id number]  HPI Comments: [name] is a very pleasant 56 year old woman, with a history of hypertension, hyperlipidemia/hypertriglycerides,  h/o breast cancer and chemotherapy with radiation  to the left  in 2010 who presents for follow up. previously she was seen for tachycardia and chest discomfort.   overall, she reports that she is doing well. She did stop her bystolic and has noticed tachycardia since that time. Blood pressure has been running lower. She is recovering from an upper respiratory infection.  she denies tachycardia or significant shortness of breath with exertion.    In February 2010 and she was diagnosed with breast cancer. She underwent chemotherapy and 36 cycles of radiation on the left side of her chest. She states having most of her treatment at [facility]. There she had an echocardiogram prior to her treatment. She states that she's had recurrence of a peptic ulcer.      EKG shows normal sinus rhythm with rate 91 beats per minute, no significant ST-T wave changes      Outpatient Encounter Prescriptions as of 05/12/2011  Medication Sig Dispense Refill  . anastrozole (ARIMIDEX) 1 MG tablet Take 1 mg by mouth daily.        . Calcium Carb-Cholecalciferol (CALCIUM 1000 + D PO) Take 1 tablet by mouth daily. Vitamin D 400mg        . ezetimibe (ZETIA) 10 MG tablet Take 1 tablet (10 mg total) by mouth daily.  90 tablet  3  . KRILL OIL 1000 MG CAPS Take 1 capsule by mouth daily.        . Multiple Vitamins-Minerals (MULTIVITAMIN WITH MINERALS) tablet Take 1 tablet by mouth daily.        . rosuvastatin (CRESTOR) 20 MG tablet Take 1 tablet (20 mg total) by mouth at bedtime.  90 tablet  3  . triamterene-hydrochlorothiazide (DYAZIDE) 37.5-25 MG per capsule Take 1 each (1 capsule total) by mouth every morning.  90 capsule  3  .  valsartan (DIOVAN) 160 MG tablet Take 1 tablet (160 mg total) by mouth  daily.  90 tablet  3  . nebivolol (BYSTOLIC) 5 MG tablet Take 1 tablet (5 mg total) by mouth daily. Stopped taking  90 tablet  3     Review of Systems  Constitutional: Negative.   HENT: Negative.   Eyes: Negative.   Respiratory: Negative.   Cardiovascular: Negative.   Gastrointestinal: Negative.   Musculoskeletal: Negative.   Skin: Negative.   Neurological: Negative.   Hematological: Negative.   Psychiatric/Behavioral: Negative.   All other systems reviewed and are negative.    BP 120/88  Pulse 90  Ht 5\' 6"  (1.676 m)  Wt 165 lb (74.844 kg)  BMI 26.63 kg/m2  Physical Exam  Nursing note and vitals reviewed. Constitutional: She is oriented to person, place, and time. She appears well-developed and well-nourished.  HENT:  Head: Normocephalic.  Nose: Nose normal.  Mouth/Throat: Oropharynx is clear and moist.  Eyes: Conjunctivae are normal. Pupils are equal, round, and reactive to light.  Neck: Normal range of motion. Neck supple. No JVD present.  Cardiovascular: Normal rate, regular rhythm, S1 normal, S2 normal, normal heart sounds and intact distal pulses.  Exam reveals no gallop and no friction rub.   No murmur heard. Pulmonary/Chest: Effort normal and breath sounds normal. No respiratory distress. She has no wheezes. She has no rales. She exhibits no tenderness.  Abdominal: Soft.  Bowel sounds are normal. She exhibits no distension. There is no tenderness.  Musculoskeletal: Normal range of motion. She exhibits no edema and no tenderness.  Lymphadenopathy:    She has no cervical adenopathy.  Neurological: She is alert and oriented to person, place, and time. Coordination normal.  Skin: Skin is warm and dry. No rash noted. No erythema.  Psychiatric: She has a normal mood and affect. Her behavior is normal. Judgment and thought content normal.         Assessment and Plan

## 2011-05-12 NOTE — Assessment & Plan Note (Signed)
Cholesterol is at goal on the current lipid regimen. No changes to the medications were made. Will recheck cholesterol. Add flax oil for elevated triglyerides.

## 2011-05-17 ENCOUNTER — Ambulatory Visit: Payer: Self-pay | Admitting: Cardiovascular Disease

## 2012-05-26 ENCOUNTER — Other Ambulatory Visit: Payer: Self-pay | Admitting: Cardiovascular Disease

## 2012-05-26 NOTE — Telephone Encounter (Signed)
Refilled Bystolic, crestor and diovan.

## 2012-05-29 ENCOUNTER — Other Ambulatory Visit: Payer: Self-pay | Admitting: Cardiovascular Disease

## 2012-05-30 ENCOUNTER — Other Ambulatory Visit: Payer: Self-pay | Admitting: *Deleted

## 2012-05-30 ENCOUNTER — Other Ambulatory Visit: Payer: Self-pay

## 2012-05-30 MED ORDER — ROSUVASTATIN CALCIUM 20 MG PO TABS: 20.0000 mg | ORAL_TABLET | Freq: Every day | ORAL | Status: DC

## 2012-05-30 MED ORDER — VALSARTAN 80 MG PO TABS: 80.0000 mg | ORAL_TABLET | Freq: Every day | ORAL | Status: DC

## 2012-05-30 NOTE — Telephone Encounter (Signed)
Refilled Crestor. 

## 2012-05-30 NOTE — Telephone Encounter (Signed)
Refill sent for diovan 80 mg

## 2012-06-23 ENCOUNTER — Other Ambulatory Visit: Payer: Self-pay | Admitting: Cardiovascular Disease

## 2012-06-23 NOTE — Telephone Encounter (Signed)
Refilled Bystolic, Crestor and diovan sent to [facility].

## 2012-07-11 ENCOUNTER — Other Ambulatory Visit: Payer: Self-pay | Admitting: Cardiovascular Disease

## 2012-07-11 NOTE — Telephone Encounter (Signed)
Refilled Zetia and DYAZIDE sent to [facility].

## 2013-03-02 ENCOUNTER — Encounter: Payer: Self-pay | Admitting: Cardiovascular Disease

## 2013-03-02 ENCOUNTER — Ambulatory Visit (INDEPENDENT_AMBULATORY_CARE_PROVIDER_SITE_OTHER): Payer: BC Managed Care – PPO | Admitting: Cardiovascular Disease

## 2013-03-02 VITALS — BP 122/80 | HR 83 | Ht 65.5 in | Wt 169.0 lb

## 2013-03-02 DIAGNOSIS — R079 Chest pain, unspecified: Secondary | ICD-10-CM

## 2013-03-02 DIAGNOSIS — I10 Essential (primary) hypertension: Secondary | ICD-10-CM

## 2013-03-02 DIAGNOSIS — E785 Hyperlipidemia, unspecified: Secondary | ICD-10-CM

## 2013-03-02 DIAGNOSIS — E782 Mixed hyperlipidemia: Secondary | ICD-10-CM

## 2013-03-02 MED ORDER — NEBIVOLOL HCL 5 MG PO TABS: 5.0000 mg | ORAL_TABLET | Freq: Every day | ORAL | Status: DC

## 2013-03-02 NOTE — Progress Notes (Signed)
Patient ID: Lauren Cervantes, female    DOB: [birth year], 57 y.o.   MRN: [id number]  HPI Comments: [name] is a very pleasant 57 year old woman, with a history of hypertension, hyperlipidemia/hypertriglycerides,  h/o breast cancer and chemotherapy with radiation  to the left  in 2010 previously seen for tachycardia and chest discomfort.   overall, she reports that she is doing well. She takes bystolic 5 mg daily with improvement of her tachycardia .  Blood pressure has been running lower.  she denies tachycardia or significant shortness of breath with exertion. She is concerned about the cost of some of her medications including her cholesterol medications. Cholesterol last year was 133, LDL less than 40    In February 2010 and she was diagnosed with breast cancer. She underwent chemotherapy and 36 cycles of radiation on the left side of her chest. She states having most of her treatment at [facility]. There she had an echocardiogram prior to her treatment. She states that she's had recurrence of a peptic ulcer.      EKG shows normal sinus rhythm with rate 83 beats per minute, no significant ST-T wave changes     Outpatient Encounter Prescriptions as of 03/02/2013  Medication Sig  . anastrozole (ARIMIDEX) 1 MG tablet Take 1 mg by mouth daily.    Marland Kitchen BYSTOLIC 5 MG tablet take 1 tablet by mouth once daily PT WANTS 90 DAY SUPPLIES  . Calcium Carb-Cholecalciferol (CALCIUM 1000 + D PO) Take 1 tablet by mouth daily. Vitamin D 400mg    . CRESTOR 20 MG tablet take 1 tablet by mouth at bedtime PT WANTS 90 DAY SUPPLIES  . DIOVAN 80 MG tablet take 1 tablet by mouth once daily PT WANTS 90 DAYS  . KRILL OIL 1000 MG CAPS Take 1 capsule by mouth daily.    . Multiple Vitamins-Minerals (MULTIVITAMIN WITH MINERALS) tablet Take 1 tablet by mouth daily.    Marland Kitchen triamterene-hydrochlorothiazide (DYAZIDE) 37.5-25 MG per capsule take 1 capsule by mouth every morning  . ZETIA 10 MG tablet take 1 tablet by  mouth once daily     Review of Systems  Constitutional: Negative.   HENT: Negative.   Eyes: Negative.   Respiratory: Negative.   Cardiovascular: Negative.   Gastrointestinal: Negative.   Endocrine: Negative.   Musculoskeletal: Negative.   Skin: Negative.   Allergic/Immunologic: Negative.   Neurological: Negative.   Hematological: Negative.   Psychiatric/Behavioral: Negative.   All other systems reviewed and are negative.    BP 122/80  Pulse 83  Ht 5' 5.5" (1.664 m)  Wt 169 lb (76.658 kg)  BMI 27.69 kg/m2  Physical Exam  Nursing note and vitals reviewed. Constitutional: She is oriented to person, place, and time. She appears well-developed and well-nourished.  HENT:  Head: Normocephalic.  Nose: Nose normal.  Mouth/Throat: Oropharynx is clear and moist.  Eyes: Conjunctivae are normal. Pupils are equal, round, and reactive to light.  Neck: Normal range of motion. Neck supple. No JVD present.  Cardiovascular: Normal rate, regular rhythm, S1 normal, S2 normal, normal heart sounds and intact distal pulses.  Exam reveals no gallop and no friction rub.   No murmur heard. Pulmonary/Chest: Effort normal and breath sounds normal. No respiratory distress. She has no wheezes. She has no rales. She exhibits no tenderness.  Abdominal: Soft. Bowel sounds are normal. She exhibits no distension. There is no tenderness.  Musculoskeletal: Normal range of motion. She exhibits no edema and no tenderness.  Lymphadenopathy:    She  has no cervical adenopathy.  Neurological: She is alert and oriented to person, place, and time. Coordination normal.  Skin: Skin is warm and dry. No rash noted. No erythema.  Psychiatric: She has a normal mood and affect. Her behavior is normal. Judgment and thought content normal.    Assessment and Plan

## 2013-03-02 NOTE — Assessment & Plan Note (Signed)
Blood pressure is well controlled on today's visit. No changes made to the medications. 

## 2013-03-02 NOTE — Assessment & Plan Note (Signed)
No recent episodes on low-dose beta blocker.

## 2013-03-02 NOTE — Assessment & Plan Note (Signed)
We have suggested that we check her cholesterol in the next week. Depending on these numbers, medications can be adjusted. She is interested in vytorin.

## 2013-03-02 NOTE — Assessment & Plan Note (Signed)
No recent episodes of chest pain. She is active at baseline

## 2013-03-02 NOTE — Patient Instructions (Signed)
You are doing well. No medication changes were made.  Come in for labs, fasting   Please call us if you have new issues that need to be addressed before your next appt.  Your physician wants you to follow-up in: 12 months.  You will receive a reminder letter in the mail two months in advance. If you don't receive a letter, please call our office to schedule the follow-up appointment.

## 2013-03-12 ENCOUNTER — Telehealth: Payer: Self-pay

## 2013-03-12 NOTE — Telephone Encounter (Signed)
Needs DX codes, please call.

## 2013-03-12 NOTE — Telephone Encounter (Signed)
Needs dx codes. Please call.

## 2013-03-13 NOTE — Telephone Encounter (Signed)
Spoke w/ Charmoin.  Gave her pt's dx codes.  She ensured they were correct by entering them into pt's account.

## 2013-03-19 ENCOUNTER — Telehealth: Payer: Self-pay

## 2013-03-19 NOTE — Telephone Encounter (Signed)
Message copied by Marilynne Halsted on Mon Mar 19, 2013 10:27 AM ------      Message from: Antonieta Iba      Created: Sun Mar 18, 2013  9:39 PM       Cholesterol and ld, very good,      Continue current meds      Watch diet, triglycerides elevated ------

## 2013-03-19 NOTE — Telephone Encounter (Signed)
Spoke w/ pt.  She is aware of results.  

## 2013-07-09 ENCOUNTER — Other Ambulatory Visit: Payer: Self-pay | Admitting: Cardiovascular Disease

## 2014-03-18 ENCOUNTER — Other Ambulatory Visit: Payer: Self-pay

## 2014-03-18 MED ORDER — NEBIVOLOL HCL 5 MG PO TABS: 5.0000 mg | ORAL_TABLET | Freq: Every day | ORAL | Status: DC

## 2014-03-18 NOTE — Telephone Encounter (Signed)
Refill sent for bystolic 

## 2014-06-18 ENCOUNTER — Other Ambulatory Visit: Payer: Self-pay | Admitting: Cardiovascular Disease

## 2014-06-18 NOTE — Telephone Encounter (Signed)
Patient needs to contact office to schedule appointment has not been seen since 2014.

## 2014-07-08 ENCOUNTER — Other Ambulatory Visit: Payer: Self-pay

## 2014-07-08 MED ORDER — EZETIMIBE 10 MG PO TABS: 10.0000 mg | ORAL_TABLET | Freq: Every day | ORAL | Status: DC

## 2014-07-08 MED ORDER — TRIAMTERENE-HCTZ 37.5-25 MG PO CAPS: 1.0000 | ORAL_CAPSULE | Freq: Every morning | ORAL | Status: DC

## 2014-07-08 MED ORDER — VALSARTAN 80 MG PO TABS: ORAL_TABLET | ORAL | Status: DC

## 2014-07-08 MED ORDER — ROSUVASTATIN CALCIUM 20 MG PO TABS: ORAL_TABLET | ORAL | Status: DC

## 2014-07-08 NOTE — Telephone Encounter (Signed)
90 supply

## 2014-07-24 ENCOUNTER — Encounter: Payer: Self-pay | Admitting: Cardiovascular Disease

## 2014-07-24 ENCOUNTER — Ambulatory Visit (INDEPENDENT_AMBULATORY_CARE_PROVIDER_SITE_OTHER): Payer: BLUE CROSS/BLUE SHIELD | Admitting: Cardiovascular Disease

## 2014-07-24 ENCOUNTER — Encounter (INDEPENDENT_AMBULATORY_CARE_PROVIDER_SITE_OTHER): Payer: Self-pay

## 2014-07-24 VITALS — BP 108/78 | HR 75 | Ht 66.0 in | Wt 167.5 lb

## 2014-07-24 DIAGNOSIS — I471 Supraventricular tachycardia: Secondary | ICD-10-CM | POA: Diagnosis not present

## 2014-07-24 DIAGNOSIS — I10 Essential (primary) hypertension: Secondary | ICD-10-CM

## 2014-07-24 DIAGNOSIS — R079 Chest pain, unspecified: Secondary | ICD-10-CM

## 2014-07-24 DIAGNOSIS — E782 Mixed hyperlipidemia: Secondary | ICD-10-CM

## 2014-07-24 DIAGNOSIS — R000 Tachycardia, unspecified: Secondary | ICD-10-CM

## 2014-07-24 NOTE — Assessment & Plan Note (Signed)
Repeat lipid panel ordered today. No medication changes made

## 2014-07-24 NOTE — Progress Notes (Signed)
Patient ID: Lauren Cervantes, female    DOB: [birth year], 59 y.o.   MRN: [id number]  HPI Comments: [name] is a very pleasant 59 year old woman, with a history of hypertension, hyperlipidemia/hypertriglycerides,  h/o breast cancer and chemotherapy with radiation  to the left  in 2010 previously seen for tachycardia and chest discomfort. She presents today for blood pressure and hyperlipidemia   overall, she reports that she is doing well. She takes bystolic 5 mg daily with improvement of her tachycardia .  Blood pressure has been running lower.  she denies tachycardia or significant shortness of breath with exertion. She is tolerating Crestor 20 g daily with zetia 10 mg daily. No recent lipid panel available Occasionally reports having orthostasis Previously used to smoke 10 cigarettes per day, now down to 1 per day with her coffee  EKG on today's visit shows normal sinus rhythm with rate 75 bpm, no significant ST or T-wave changes  Other past medical history  In February 2010 and she was diagnosed with breast cancer. She underwent chemotherapy and 36 cycles of radiation on the left side of her chest. She states having most of her treatment at [facility]. There she had an echocardiogram prior to her treatment. She states that she's had recurrence of a peptic ulcer.     No Known Allergies  Outpatient Encounter Prescriptions as of 07/24/2014  Medication Sig  . anastrozole (ARIMIDEX) 1 MG tablet Take 1 mg by mouth daily.    . Calcium Carb-Cholecalciferol (CALCIUM 1000 + D PO) Take 1 tablet by mouth daily. Vitamin D 400mg    . ezetimibe (ZETIA) 10 MG tablet Take 1 tablet (10 mg total) by mouth daily.  Marland Kitchen KRILL OIL 1000 MG CAPS Take 1 capsule by mouth daily.    . Multiple Vitamins-Minerals (MULTIVITAMIN WITH MINERALS) tablet Take 1 tablet by mouth daily.    . nebivolol (BYSTOLIC) 5 MG tablet Take 1 tablet (5 mg total) by mouth daily.  . nicotine (NICODERM CQ - DOSED IN MG/24 HOURS)  14 mg/24hr patch Place 14 mg onto the skin daily.  . rosuvastatin (CRESTOR) 20 MG tablet take 1 tablet by mouth at bedtime PT WANTS 90 DAYS  . triamterene-hydrochlorothiazide (DYAZIDE) 37.5-25 MG per capsule Take 1 each (1 capsule total) by mouth every morning.  . valsartan (DIOVAN) 80 MG tablet take 1 tablet by mouth once daily PT WANTS 90 DAYS    Past Medical History  Diagnosis Date  . Breast cancer 2010  . Hyperlipidemia   . Hypertension   . Lymphedema of arm     left arm.    Past Surgical History  Procedure Laterality Date  . Total abdominal hysterectomy    . Breast lumpectomy      Social History  reports that she has been smoking Cigarettes.  She has a 7.5 pack-year smoking history. She does not have any smokeless tobacco history on file. She reports that she drinks about 1.1 oz of alcohol per week. She reports that she does not use illicit drugs.  Family History family history includes Cancer in an other family member; Coronary artery disease in an other family member; Hyperlipidemia in an other family member; Hypertension in her father, mother, and another family member; Stroke in her father and another family member.  Review of Systems  Constitutional: Negative.   Respiratory: Negative.   Cardiovascular: Negative.   Gastrointestinal: Negative.   Musculoskeletal: Negative.   Skin: Negative.   Neurological: Negative.   Hematological: Negative.  Psychiatric/Behavioral: Negative.   All other systems reviewed and are negative.   BP 108/78 mmHg  Pulse 75  Ht 5\' 6"  (1.676 m)  Wt 167 lb 8 oz (75.978 kg)  BMI 27.05 kg/m2  Physical Exam  Constitutional: She is oriented to person, place, and time. She appears well-developed and well-nourished.  HENT:  Head: Normocephalic.  Nose: Nose normal.  Mouth/Throat: Oropharynx is clear and moist.  Eyes: Conjunctivae are normal. Pupils are equal, round, and reactive to light.  Neck: Normal range of motion. Neck supple. No JVD  present.  Cardiovascular: Normal rate, regular rhythm, S1 normal, S2 normal, normal heart sounds and intact distal pulses.  Exam reveals no gallop and no friction rub.   No murmur heard. Pulmonary/Chest: Effort normal and breath sounds normal. No respiratory distress. She has no wheezes. She has no rales. She exhibits no tenderness.  Abdominal: Soft. Bowel sounds are normal. She exhibits no distension. There is no tenderness.  Musculoskeletal: Normal range of motion. She exhibits no edema or tenderness.  Lymphadenopathy:    She has no cervical adenopathy.  Neurological: She is alert and oriented to person, place, and time. Coordination normal.  Skin: Skin is warm and dry. No rash noted. No erythema.  Psychiatric: She has a normal mood and affect. Her behavior is normal. Judgment and thought content normal.    Assessment and Plan  Nursing note and vitals reviewed.

## 2014-07-24 NOTE — Assessment & Plan Note (Signed)
Will stay on low-dose beta blocker. No side effects on the bystolic daily

## 2014-07-24 NOTE — Assessment & Plan Note (Signed)
Denies any recent episodes of chest pain. No further testing at this time

## 2014-07-24 NOTE — Assessment & Plan Note (Addendum)
Blood pressures running low. Recommended she hold the Diovan If blood pressure continues to run low, additional changes may be needed If blood pressure runs high, could cut the Diovan in half We have ordered a basic metabolic panel to check potassium and creatinine as she is on HCTZ. Discussed low potassium side effect with her from HCTZ

## 2014-07-24 NOTE — Patient Instructions (Signed)
You are doing well.  Please hold the diovan for low blood pressure If blood pressure runs up, cut the diovan in 1/2 daily  We will check liver,  lipids and BMP today  Please call us if you have new issues that need to be addressed before your next appt.  Your physician wants you to follow-up in: 12 months.  You will receive a reminder letter in the mail two months in advance. If you don't receive a letter, please call our office to schedule the follow-up appointment.

## 2014-07-25 LAB — BASIC METABOLIC PANEL WITH GFR
BUN/Creatinine Ratio: 21 (ref 9–23)
BUN: 12 mg/dL (ref 6–24)
CO2: 22 mmol/L (ref 18–29)
Calcium: 9.5 mg/dL (ref 8.7–10.2)
Chloride: 96 mmol/L — ABNORMAL LOW (ref 97–108)
Creatinine, Ser: 0.58 mg/dL (ref 0.57–1.00)
GFR calc Af Amer: 119 mL/min/1.73
GFR calc non Af Amer: 103 mL/min/1.73
Glucose: 94 mg/dL (ref 65–99)
Potassium: 4.2 mmol/L (ref 3.5–5.2)
Sodium: 138 mmol/L (ref 134–144)

## 2014-07-25 LAB — LIPID PANEL
Chol/HDL Ratio: 4.2 ratio (ref 0.0–4.4)
Cholesterol, Total: 144 mg/dL (ref 100–199)
HDL: 34 mg/dL — ABNORMAL LOW
LDL Calculated: 46 mg/dL (ref 0–99)
Triglycerides: 322 mg/dL — ABNORMAL HIGH (ref 0–149)
VLDL Cholesterol Cal: 64 mg/dL — ABNORMAL HIGH (ref 5–40)

## 2014-07-25 LAB — HEPATIC FUNCTION PANEL
ALT: 33 IU/L — ABNORMAL HIGH (ref 0–32)
AST: 24 IU/L (ref 0–40)
Albumin: 4.9 g/dL (ref 3.5–5.5)
Alkaline Phosphatase: 69 IU/L (ref 39–117)
Bilirubin Total: 0.3 mg/dL (ref 0.0–1.2)
Bilirubin, Direct: 0.09 mg/dL (ref 0.00–0.40)
Total Protein: 6.8 g/dL (ref 6.0–8.5)

## 2014-07-29 ENCOUNTER — Telehealth: Payer: Self-pay

## 2014-07-29 NOTE — Telephone Encounter (Signed)
Results are pending Dr. Donivan Scull review and recommendation.

## 2014-07-29 NOTE — Telephone Encounter (Signed)
Pt would like lab results.  

## 2014-07-31 ENCOUNTER — Telehealth: Payer: Self-pay | Admitting: *Deleted

## 2014-07-31 NOTE — Telephone Encounter (Signed)
-----   Message from Minna Merritts, MD sent at 07/29/2014  5:03 PM EDT ----- Lab work reviewed Cholesterol is excellent, LFTs and basic metabolic panel normal Continue current medications Total cholesterol is 144 Triglycerides the best they have been in many years

## 2014-07-31 NOTE — Telephone Encounter (Signed)
Pt made aware and was able to view on MyChart. No pcp

## 2015-02-07 ENCOUNTER — Encounter: Payer: Self-pay | Admitting: Family Medicine

## 2015-02-07 ENCOUNTER — Ambulatory Visit (INDEPENDENT_AMBULATORY_CARE_PROVIDER_SITE_OTHER): Payer: BLUE CROSS/BLUE SHIELD | Admitting: Family Medicine

## 2015-02-07 VITALS — BP 160/90 | HR 80 | Temp 98.1°F | Resp 16 | Ht 66.0 in | Wt 168.0 lb

## 2015-02-07 DIAGNOSIS — B001 Herpesviral vesicular dermatitis: Secondary | ICD-10-CM | POA: Diagnosis not present

## 2015-02-07 DIAGNOSIS — C50912 Malignant neoplasm of unspecified site of left female breast: Secondary | ICD-10-CM

## 2015-02-07 DIAGNOSIS — Z1239 Encounter for other screening for malignant neoplasm of breast: Secondary | ICD-10-CM | POA: Diagnosis not present

## 2015-02-07 DIAGNOSIS — M8580 Other specified disorders of bone density and structure, unspecified site: Secondary | ICD-10-CM

## 2015-02-07 DIAGNOSIS — I10 Essential (primary) hypertension: Secondary | ICD-10-CM

## 2015-02-07 DIAGNOSIS — Z7189 Other specified counseling: Secondary | ICD-10-CM | POA: Diagnosis not present

## 2015-02-07 DIAGNOSIS — Z853 Personal history of malignant neoplasm of breast: Secondary | ICD-10-CM | POA: Insufficient documentation

## 2015-02-07 DIAGNOSIS — Z7689 Persons encountering health services in other specified circumstances: Secondary | ICD-10-CM

## 2015-02-07 NOTE — Patient Instructions (Signed)
  Discuss colonoscopy at next visit.    To get flu shot next week.    Plan to re-evaluate BP at next visit

## 2015-02-07 NOTE — Progress Notes (Signed)
Name: Lauren Cervantes   MRN: [id number]    DOB: 07-23-1955   Date:02/07/2015       Progress Note  Subjective  Chief Complaint  Chief Complaint  Patient presents with  . Establish Care    Pt needs bone density ordered. Former pcp Cradid.  Marland Kitchen Hypertension  . Hyperlipidemia    HPI Here to establish care.  Has not had a PCP in many yrs.  Sees Dr. Esmond Plants re; BP and hyperlipidemia.  Sees [facility] for her breast cancer .  Smokes.  Not really ready to stop yet.   BP at homed ususally 130/80 range.  Needs bone density study.   No problem-specific assessment & plan notes found for this encounter.   Past Medical History  Diagnosis Date  . Breast cancer ([place]) 2010  . Hyperlipidemia   . Hypertension   . Lymphedema of arm     left arm.    Past Surgical History  Procedure Laterality Date  . Total abdominal hysterectomy    . Breast lumpectomy      Family History  Problem Relation Age of Onset  . Cancer    . Coronary artery disease    . Stroke    . Hyperlipidemia    . Hypertension    . Hypertension Mother   . Hypertension Father   . Stroke Father     Social History   Social History  . Marital Status: Single    Spouse Name: N/A  . Number of Children: N/A  . Years of Education: N/A   Occupational History  . Writer    Social History Main Topics  . Smoking status: Current Some Day Smoker -- 0.25 packs/day for 30 years    Types: Cigarettes    Last Attempt to Quit: 05/04/2011  . Smokeless tobacco: Never Used     Comment: Patient using the electronic cigarette  . Alcohol Use: 1.1 oz/week    1 Glasses of wine, 1 Standard drinks or equivalent per week     Comment: 1 a day most days  . Drug Use: No  . Sexual Activity: Not on file   Other Topics Concern  . Not on file   Social History Narrative   Regular exercise. Getting back into after cancer           Current outpatient prescriptions:  .  acyclovir (ZOVIRAX) 400 MG tablet, Take  400 mg by mouth 2 (two) times daily as needed., Disp: , Rfl:  .  anastrozole (ARIMIDEX) 1 MG tablet, Take 1 mg by mouth daily.  , Disp: , Rfl:  .  Calcium Carb-Cholecalciferol (CALCIUM 1000 + D PO), Take 1 tablet by mouth daily. Vitamin D 400mg  , Disp: , Rfl:  .  Cholecalciferol (VITAMIN D3) 1000 UNITS CAPS, Take 1,000 Units by mouth daily., Disp: , Rfl:  .  ezetimibe (ZETIA) 10 MG tablet, Take 1 tablet (10 mg total) by mouth daily., Disp: 90 tablet, Rfl: 3 .  KRILL OIL 1000 MG CAPS, Take 1 capsule by mouth daily.  , Disp: , Rfl:  .  Multiple Vitamins-Minerals (MULTIVITAMIN WITH MINERALS) tablet, Take 1 tablet by mouth daily.  , Disp: , Rfl:  .  nebivolol (BYSTOLIC) 5 MG tablet, Take 1 tablet (5 mg total) by mouth daily., Disp: 90 tablet, Rfl: 3 .  nicotine (NICODERM CQ - DOSED IN MG/24 HOURS) 14 mg/24hr patch, Place 14 mg onto the skin daily., Disp: , Rfl:  .  nystatin (MYCOSTATIN)  powder, Apply 1 application topically as needed., Disp: , Rfl:  .  rosuvastatin (CRESTOR) 20 MG tablet, take 1 tablet by mouth at bedtime PT WANTS 90 DAYS, Disp: 90 tablet, Rfl: 3 .  triamterene-hydrochlorothiazide (DYAZIDE) 37.5-25 MG per capsule, Take 1 each (1 capsule total) by mouth every morning., Disp: 90 capsule, Rfl: 3  Allergies  Allergen Reactions  . Hydrocodone-Acetaminophen Rash     Review of Systems  Constitutional: Negative for fever, chills, weight loss and malaise/fatigue.  HENT: Negative for hearing loss.   Eyes: Negative for blurred vision and double vision.  Respiratory: Negative for cough, sputum production, shortness of breath and wheezing.   Cardiovascular: Negative for chest pain, palpitations, orthopnea and leg swelling.  Gastrointestinal: Negative for heartburn, abdominal pain and blood in stool.  Genitourinary: Negative for dysuria, urgency and frequency.  Musculoskeletal: Negative for myalgias and joint pain.  Neurological: Negative for dizziness, tremors, weakness and headaches.       Objective  Filed Vitals:   02/07/15 1347  BP: 148/96  Pulse: 80  Temp: 98.1 F (36.7 C)  TempSrc: Oral  Resp: 16  Height: 5\' 6"  (1.676 m)  Weight: 168 lb (76.204 kg)    Physical Exam  Constitutional: She is oriented to person, place, and time. She appears distressed.  HENT:  Head: Normocephalic and atraumatic.  Neck: Normal range of motion. Neck supple. Carotid bruit is not present. No thyromegaly present.  Cardiovascular: Normal rate, regular rhythm, normal heart sounds and intact distal pulses.  Exam reveals no gallop and no friction rub.   No murmur heard. Pulmonary/Chest: Effort normal and breath sounds normal. No respiratory distress. She has no wheezes. She has no rales.  Abdominal: Soft. Bowel sounds are normal. She exhibits no distension. There is no tenderness. There is no rebound.  Musculoskeletal: She exhibits no edema.  Lymphadenopathy:    She has no cervical adenopathy.  Neurological: She is alert and oriented to person, place, and time.  Vitals reviewed.      No results found for this or any previous visit (from the past 2160 hour(s)).   Assessment & Plan  Problem List Items Addressed This Visit      Digestive   Herpes labialis   Relevant Medications   acyclovir (ZOVIRAX) 400 MG tablet   nystatin (MYCOSTATIN) powder     Other   Breast cancer, left (HCC)   RESOLVED: Breast cancer screening - Primary      Meds ordered this encounter  Medications  . acyclovir (ZOVIRAX) 400 MG tablet    Sig: Take 400 mg by mouth 2 (two) times daily as needed.  . Cholecalciferol (VITAMIN D3) 1000 UNITS CAPS    Sig: Take 1,000 Units by mouth daily.  Marland Kitchen nystatin (MYCOSTATIN) powder    Sig: Apply 1 application topically as needed.    1. Encounter to establish care   2. Essential hypertension, malignant -cont. meds.  3. Breast cancer screening   4. Malignant neoplasm of left female breast, unspecified site of breast ([place])   5. Herpes  labialis   6. Osteopenia  - DG Bone Density; Future

## 2015-02-17 ENCOUNTER — Telehealth: Payer: Self-pay | Admitting: Family Medicine

## 2015-02-17 NOTE — Telephone Encounter (Signed)
Pt said someone was supposed to schedule a bone density for her.  Her call back number is [phone]

## 2015-02-18 ENCOUNTER — Ambulatory Visit (INDEPENDENT_AMBULATORY_CARE_PROVIDER_SITE_OTHER): Payer: BLUE CROSS/BLUE SHIELD | Admitting: *Deleted

## 2015-02-18 DIAGNOSIS — Z23 Encounter for immunization: Secondary | ICD-10-CM | POA: Diagnosis not present

## 2015-02-18 NOTE — Telephone Encounter (Signed)
Order faxed to [email or web address]

## 2015-02-28 ENCOUNTER — Telehealth: Payer: Self-pay | Admitting: *Deleted

## 2015-02-28 DIAGNOSIS — M810 Age-related osteoporosis without current pathological fracture: Secondary | ICD-10-CM

## 2015-02-28 MED ORDER — ALENDRONATE SODIUM 70 MG PO TABS: 70.0000 mg | ORAL_TABLET | ORAL | Status: DC

## 2015-02-28 NOTE — Telephone Encounter (Signed)
Called patient to share bd results. She has osteoporosis. She needs to start Fosamax 70 mg 1 per week. Rx will be sent #4 with 12 refills. She also needs to start Oscal otc 1 twice daily.

## 2015-03-03 ENCOUNTER — Telehealth: Payer: Self-pay | Admitting: *Deleted

## 2015-03-03 NOTE — Telephone Encounter (Signed)
Has the SOB resolved?  This should not be from the Fosamax.  I have never seen this or have heard if it as a side effect.  Skip a week and try it one more time.

## 2015-03-03 NOTE — Telephone Encounter (Signed)
Patient started Fosamax on yesterday. Patient c/o sob that started after taking Fosamax. Please advise. Call back [phone]

## 2015-03-04 NOTE — Telephone Encounter (Signed)
Patient feels better this morning. She had acid reflux and had to take Zantac last night. She says her bones/hips hurt so bad. She says she hasn't felt that bad since she was on chemo. Restarting Fosamax is not an option. Please advise.

## 2015-03-04 NOTE — Telephone Encounter (Signed)
Ok to stop Fosamax.  We can discuss infusion in the future, but would just wait until next f/u visit .-jh

## 2015-03-13 ENCOUNTER — Encounter: Payer: Self-pay | Admitting: Family Medicine

## 2015-03-13 ENCOUNTER — Ambulatory Visit (INDEPENDENT_AMBULATORY_CARE_PROVIDER_SITE_OTHER): Payer: BLUE CROSS/BLUE SHIELD | Admitting: Family Medicine

## 2015-03-13 VITALS — BP 155/90 | HR 80 | Resp 16 | Ht 66.0 in | Wt 166.4 lb

## 2015-03-13 DIAGNOSIS — R000 Tachycardia, unspecified: Secondary | ICD-10-CM

## 2015-03-13 DIAGNOSIS — R079 Chest pain, unspecified: Secondary | ICD-10-CM

## 2015-03-13 DIAGNOSIS — I10 Essential (primary) hypertension: Secondary | ICD-10-CM

## 2015-03-13 DIAGNOSIS — Z299 Encounter for prophylactic measures, unspecified: Secondary | ICD-10-CM | POA: Diagnosis not present

## 2015-03-13 DIAGNOSIS — E782 Mixed hyperlipidemia: Secondary | ICD-10-CM

## 2015-03-13 DIAGNOSIS — Z1211 Encounter for screening for malignant neoplasm of colon: Secondary | ICD-10-CM | POA: Diagnosis not present

## 2015-03-13 MED ORDER — LISINOPRIL 20 MG PO TABS: 20.0000 mg | ORAL_TABLET | Freq: Every day | ORAL | Status: DC

## 2015-03-13 NOTE — Progress Notes (Signed)
Name: Lauren Cervantes   MRN: [id number]    DOB: 01/10/1956   Date:03/13/2015       Progress Note  Subjective  Chief Complaint  Chief Complaint  Patient presents with  . Hypertension    109/70-140-85    HPI Here for f/u of HBP.  Also with elevated li[pids and osteoporosis.  She tried Fosamax, but had severe myalgias/ arthralgias and severe heartburn for 6 days.  Will not take Again.  Will recheck bone density in 2 years.  Still smoking ~ 4 cigs a day.    BPs at home 120-140/80-90  No problem-specific assessment & plan notes found for this encounter.   Past Medical History  Diagnosis Date  . Breast cancer ([place]) 2010  . Hyperlipidemia   . Hypertension   . Lymphedema of arm     left arm.    Social History  Substance Use Topics  . Smoking status: Current Some Day Smoker -- 0.25 packs/day for 30 years    Types: Cigarettes    Last Attempt to Quit: 05/04/2011  . Smokeless tobacco: Never Used     Comment: Patient using the electronic cigarette  . Alcohol Use: 1.1 oz/week    1 Glasses of wine, 1 Standard drinks or equivalent per week     Comment: 1 a day most days     Current outpatient prescriptions:  .  acyclovir (ZOVIRAX) 400 MG tablet, Take 400 mg by mouth 2 (two) times daily as needed., Disp: , Rfl:  .  anastrozole (ARIMIDEX) 1 MG tablet, Take 1 mg by mouth daily.  , Disp: , Rfl:  .  Calcium Carb-Cholecalciferol (CALCIUM 1000 + D PO), Take 1 tablet by mouth daily. Vitamin D 400mg  , Disp: , Rfl:  .  Cholecalciferol (VITAMIN D3) 1000 UNITS CAPS, Take 1,000 Units by mouth daily., Disp: , Rfl:  .  ezetimibe (ZETIA) 10 MG tablet, Take 1 tablet (10 mg total) by mouth daily., Disp: 90 tablet, Rfl: 3 .  KRILL OIL 1000 MG CAPS, Take 1 capsule by mouth daily.  , Disp: , Rfl:  .  Multiple Vitamins-Minerals (MULTIVITAMIN WITH MINERALS) tablet, Take 1 tablet by mouth daily.  , Disp: , Rfl:  .  nebivolol (BYSTOLIC) 5 MG tablet, Take 1 tablet (5 mg total) by mouth daily., Disp: 90  tablet, Rfl: 3 .  nicotine (NICODERM CQ - DOSED IN MG/24 HOURS) 14 mg/24hr patch, Place 14 mg onto the skin daily., Disp: , Rfl:  .  nystatin (MYCOSTATIN) powder, Apply 1 application topically as needed., Disp: , Rfl:  .  rosuvastatin (CRESTOR) 20 MG tablet, take 1 tablet by mouth at bedtime PT WANTS 90 DAYS, Disp: 90 tablet, Rfl: 3 .  triamterene-hydrochlorothiazide (DYAZIDE) 37.5-25 MG per capsule, Take 1 each (1 capsule total) by mouth every morning., Disp: 90 capsule, Rfl: 3  Allergies  Allergen Reactions  . Hydrocodone-Acetaminophen Rash    Review of Systems  Constitutional: Negative for fever, chills, weight loss and malaise/fatigue.  HENT: Negative for hearing loss.   Eyes: Negative for blurred vision and double vision.  Respiratory: Negative for cough, shortness of breath and wheezing.   Cardiovascular: Negative for chest pain, palpitations and leg swelling.  Gastrointestinal: Negative for heartburn, abdominal pain and blood in stool.  Genitourinary: Negative for dysuria, urgency and frequency.  Musculoskeletal: Negative for myalgias and joint pain.  Skin: Negative for rash.  Neurological: Negative for dizziness, tremors, weakness and headaches.  Psychiatric/Behavioral: Negative for depression.      Objective  Filed Vitals:   03/13/15 0852  BP: 160/94  Pulse: 80  Resp: 16  Height: 5\' 6"  (1.676 m)  Weight: 166 lb 6.4 oz (75.479 kg)     Physical Exam  Constitutional: She is oriented to person, place, and time and well-developed, well-nourished, and in no distress. No distress.  HENT:  Head: Normocephalic and atraumatic.  Eyes: Conjunctivae and EOM are normal. Pupils are equal, round, and reactive to light. No scleral icterus.  Neck: Normal range of motion. Neck supple. Carotid bruit is not present. No thyromegaly present.  Cardiovascular: Normal rate, regular rhythm, normal heart sounds and intact distal pulses.  Exam reveals no gallop and no friction rub.   No  murmur heard. Pulmonary/Chest: Effort normal and breath sounds normal. No respiratory distress. She has no wheezes. She has no rales.  Abdominal: Soft. Bowel sounds are normal. She exhibits no distension, no abdominal bruit and no mass. There is no tenderness.  Musculoskeletal: Normal range of motion. She exhibits no edema.  Lymphadenopathy:    She has no cervical adenopathy.  Neurological: She is alert and oriented to person, place, and time.  Vitals reviewed.     No results found for this or any previous visit (from the past 2160 hour(s)).   Assessment & Plan  1. Essential hypertension, malignant -stop Dyazide -cont Lisinopril - lisinopril (PRINIVIL,ZESTRIL) 20 MG tablet; Take 1 tablet (20 mg total) by mouth daily.  Dispense: 90 tablet; Refill: 3  2. HYPERLIPIDEMIA, MIXED -cont. Zetia and Crestor  3. Sinus tachycardia (HCC)   4. Chest pain, unspecified chest pain type

## 2015-03-13 NOTE — Addendum Note (Signed)
Addended by: Devona Konig on: 03/13/2015 04:31 PM   Modules accepted: Orders

## 2015-03-13 NOTE — Patient Instructions (Signed)
Discussed stopping cigs again.

## 2015-03-14 NOTE — Addendum Note (Signed)
Addended by: Theresia Majors A on: 03/14/2015 08:40 AM   Modules accepted: Orders, SmartSet

## 2015-03-14 NOTE — Addendum Note (Signed)
Addended by: Theresia Majors A on: 03/14/2015 08:44 AM   Modules accepted: Orders, SmartSet

## 2015-04-29 ENCOUNTER — Encounter: Payer: Self-pay | Admitting: Family Medicine

## 2015-04-29 ENCOUNTER — Ambulatory Visit (INDEPENDENT_AMBULATORY_CARE_PROVIDER_SITE_OTHER): Payer: BLUE CROSS/BLUE SHIELD | Admitting: Family Medicine

## 2015-04-29 VITALS — BP 134/86 | HR 99 | Temp 98.5°F | Resp 16 | Ht 66.0 in | Wt 166.0 lb

## 2015-04-29 DIAGNOSIS — E782 Mixed hyperlipidemia: Secondary | ICD-10-CM

## 2015-04-29 DIAGNOSIS — R000 Tachycardia, unspecified: Secondary | ICD-10-CM

## 2015-04-29 DIAGNOSIS — I10 Essential (primary) hypertension: Secondary | ICD-10-CM

## 2015-04-29 MED ORDER — LOSARTAN POTASSIUM 50 MG PO TABS: 50.0000 mg | ORAL_TABLET | Freq: Every day | ORAL | Status: DC

## 2015-04-29 NOTE — Progress Notes (Signed)
Name: Lauren Cervantes   MRN: [id number]    DOB: 1955-11-09   Date:04/29/2015       Progress Note  Subjective  Chief Complaint  Chief Complaint  Patient presents with  . Hypertension    HPI Here for f/u of HBP.  Has developed a dry throaty cough several times during the day.  Otherwise she feels well.   BPs at home 115-135/70-80 No problem-specific assessment & plan notes found for this encounter.   Past Medical History  Diagnosis Date  . Breast cancer ([place]) 2010  . Hyperlipidemia   . Hypertension   . Lymphedema of arm     left arm.    Past Surgical History  Procedure Laterality Date  . Total abdominal hysterectomy    . Breast lumpectomy      Family History  Problem Relation Age of Onset  . Cancer    . Coronary artery disease    . Stroke    . Hyperlipidemia    . Hypertension    . Hypertension Mother   . Hypertension Father   . Stroke Father     Social History   Social History  . Marital Status: Significant Other    Spouse Name: N/A  . Number of Children: N/A  . Years of Education: N/A   Occupational History  . Writer    Social History Main Topics  . Smoking status: Current Some Day Smoker -- 0.25 packs/day for 30 years    Types: Cigarettes    Last Attempt to Quit: 05/04/2011  . Smokeless tobacco: Never Used     Comment: Patient using the electronic cigarette  . Alcohol Use: 1.1 oz/week    1 Glasses of wine, 1 Standard drinks or equivalent per week     Comment: 1 a day most days  . Drug Use: No  . Sexual Activity: Not on file   Other Topics Concern  . Not on file   Social History Narrative   Regular exercise. Getting back into after cancer           Current outpatient prescriptions:  .  acyclovir (ZOVIRAX) 400 MG tablet, Take 400 mg by mouth 2 (two) times daily as needed., Disp: , Rfl:  .  anastrozole (ARIMIDEX) 1 MG tablet, Take 1 mg by mouth daily.  , Disp: , Rfl:  .  Calcium Carb-Cholecalciferol (CALCIUM 1000 + D  PO), Take 1 tablet by mouth daily. Vitamin D 400mg  , Disp: , Rfl:  .  Cholecalciferol (VITAMIN D3) 1000 UNITS CAPS, Take 1,000 Units by mouth daily., Disp: , Rfl:  .  ezetimibe (ZETIA) 10 MG tablet, Take 1 tablet (10 mg total) by mouth daily., Disp: 90 tablet, Rfl: 3 .  KRILL OIL 1000 MG CAPS, Take 1 capsule by mouth daily.  , Disp: , Rfl:  .  Multiple Vitamins-Minerals (MULTIVITAMIN WITH MINERALS) tablet, Take 1 tablet by mouth daily.  , Disp: , Rfl:  .  nebivolol (BYSTOLIC) 5 MG tablet, Take 1 tablet (5 mg total) by mouth daily., Disp: 90 tablet, Rfl: 3 .  nicotine (NICODERM CQ - DOSED IN MG/24 HOURS) 14 mg/24hr patch, Place 14 mg onto the skin daily., Disp: , Rfl:  .  nystatin (MYCOSTATIN) powder, Apply 1 application topically as needed., Disp: , Rfl:  .  rosuvastatin (CRESTOR) 20 MG tablet, take 1 tablet by mouth at bedtime PT WANTS 90 DAYS, Disp: 90 tablet, Rfl: 3 .  losartan (COZAAR) 50 MG tablet, Take 1 tablet (  50 mg total) by mouth daily., Disp: 90 tablet, Rfl: 3  Allergies  Allergen Reactions  . Hydrocodone-Acetaminophen Rash     Review of Systems  Constitutional: Negative for fever, chills, weight loss and malaise/fatigue.  HENT: Negative for hearing loss.   Eyes: Negative for blurred vision and double vision.  Respiratory: Positive for cough (dry throaty cough). Negative for shortness of breath and wheezing.   Cardiovascular: Negative for chest pain, palpitations and leg swelling.  Gastrointestinal: Negative for heartburn, abdominal pain and blood in stool.  Genitourinary: Negative for dysuria, urgency and frequency.  Musculoskeletal: Negative for myalgias and joint pain.  Skin: Negative for rash.  Neurological: Negative for dizziness, tremors, weakness and headaches.      Objective  Filed Vitals:   04/29/15 0829  BP: 134/86  Pulse: 99  Temp: 98.5 F (36.9 C)  TempSrc: Oral  Resp: 16  Height: 5\' 6"  (1.676 m)  Weight: 166 lb (75.297 kg)    Physical Exam   Constitutional: She is oriented to person, place, and time and well-developed, well-nourished, and in no distress. No distress.  HENT:  Head: Normocephalic and atraumatic.  Eyes: Conjunctivae and EOM are normal. Pupils are equal, round, and reactive to light. No scleral icterus.  Neck: Normal range of motion. Neck supple. Carotid bruit is not present. No thyromegaly present.  Cardiovascular: Normal rate, regular rhythm and normal heart sounds.  Exam reveals no gallop and no friction rub.   No murmur heard. Pulmonary/Chest: Effort normal and breath sounds normal. No respiratory distress. She has no wheezes. She has no rales.  Abdominal: Soft. Bowel sounds are normal. She exhibits no distension, no abdominal bruit and no mass. There is no tenderness.  Musculoskeletal: She exhibits no edema.  Lymphadenopathy:    She has no cervical adenopathy.  Neurological: She is alert and oriented to person, place, and time.  Vitals reviewed.      No results found for this or any previous visit (from the past 2160 hour(s)).   Assessment & Plan  Problem List Items Addressed This Visit      Cardiovascular and Mediastinum   ESSENTIAL HYPERTENSION, MALIGNANT - Primary   Relevant Medications   losartan (COZAAR) 50 MG tablet     Other   HYPERLIPIDEMIA, MIXED   Relevant Medications   losartan (COZAAR) 50 MG tablet   Sinus tachycardia (HCC)      Meds ordered this encounter  Medications  . losartan (COZAAR) 50 MG tablet    Sig: Take 1 tablet (50 mg total) by mouth daily.    Dispense:  90 tablet    Refill:  3   1. Essential hypertension, malignant  - losartan (COZAAR) 50 MG tablet; Take 1 tablet (50 mg total) by mouth daily.  Dispense: 90 tablet; Refill: 3  2. HYPERLIPIDEMIA, MIXED _cont Zetia  3. Sinus tachycardia ([place]) -cont. Bystolic

## 2015-06-03 ENCOUNTER — Ambulatory Visit (INDEPENDENT_AMBULATORY_CARE_PROVIDER_SITE_OTHER): Payer: BLUE CROSS/BLUE SHIELD | Admitting: Family Medicine

## 2015-06-03 ENCOUNTER — Encounter: Payer: Self-pay | Admitting: Family Medicine

## 2015-06-03 VITALS — BP 120/70 | HR 80 | Temp 99.1°F | Resp 16 | Ht 66.0 in | Wt 168.6 lb

## 2015-06-03 DIAGNOSIS — F419 Anxiety disorder, unspecified: Secondary | ICD-10-CM | POA: Diagnosis not present

## 2015-06-03 DIAGNOSIS — E782 Mixed hyperlipidemia: Secondary | ICD-10-CM

## 2015-06-03 DIAGNOSIS — R000 Tachycardia, unspecified: Secondary | ICD-10-CM | POA: Diagnosis not present

## 2015-06-03 DIAGNOSIS — I10 Essential (primary) hypertension: Secondary | ICD-10-CM

## 2015-06-03 MED ORDER — LOSARTAN POTASSIUM 50 MG PO TABS: 50.0000 mg | ORAL_TABLET | Freq: Every day | ORAL | Status: DC

## 2015-06-03 MED ORDER — BUPROPION HCL ER (XL) 150 MG PO TB24: 150.0000 mg | ORAL_TABLET | Freq: Every day | ORAL | Status: DC

## 2015-06-03 MED ORDER — ROSUVASTATIN CALCIUM 20 MG PO TABS: ORAL_TABLET | ORAL | Status: DC

## 2015-06-03 MED ORDER — EZETIMIBE 10 MG PO TABS: 10.0000 mg | ORAL_TABLET | Freq: Every day | ORAL | Status: DC

## 2015-06-03 MED ORDER — NEBIVOLOL HCL 5 MG PO TABS: 5.0000 mg | ORAL_TABLET | Freq: Every day | ORAL | Status: DC

## 2015-06-03 NOTE — Progress Notes (Signed)
Name: Lauren Cervantes   MRN: [id number]    DOB: 1955-05-26   Date:06/03/2015       Progress Note  Subjective  Chief Complaint  Chief Complaint  Patient presents with  . Hypertension    Follow up    HPI Here for f/u of HBP.  Has elevated cholesterol.  Taking all meds.  Feels very anxious,.  Has stopped smoking.  Needs refills on meds.  No problem-specific assessment & plan notes found for this encounter.   Past Medical History  Diagnosis Date  . Breast cancer ([place]) 2010  . Hyperlipidemia   . Hypertension   . Lymphedema of arm     left arm.    Past Surgical History  Procedure Laterality Date  . Total abdominal hysterectomy    . Breast lumpectomy      Family History  Problem Relation Age of Onset  . Cancer    . Coronary artery disease    . Stroke    . Hyperlipidemia    . Hypertension    . Hypertension Mother   . Hypertension Father   . Stroke Father     Social History   Social History  . Marital Status: Significant Other    Spouse Name: N/A  . Number of Children: N/A  . Years of Education: N/A   Occupational History  . Writer    Social History Main Topics  . Smoking status: Current Some Day Smoker -- 0.25 packs/day for 30 years    Types: Cigarettes    Last Attempt to Quit: 05/04/2011  . Smokeless tobacco: Never Used     Comment: Patient using the electronic cigarette  . Alcohol Use: 1.1 oz/week    1 Glasses of wine, 1 Standard drinks or equivalent per week     Comment: 1 a day most days  . Drug Use: No  . Sexual Activity: Not on file   Other Topics Concern  . Not on file   Social History Narrative   Regular exercise. Getting back into after cancer           Current outpatient prescriptions:  .  acyclovir (ZOVIRAX) 400 MG tablet, Take 400 mg by mouth 2 (two) times daily as needed., Disp: , Rfl:  .  anastrozole (ARIMIDEX) 1 MG tablet, Take 1 mg by mouth daily.  , Disp: , Rfl:  .  Calcium Carb-Cholecalciferol (CALCIUM  1000 + D PO), Take 1 tablet by mouth daily. Vitamin D 400mg  , Disp: , Rfl:  .  Cholecalciferol (VITAMIN D3) 1000 UNITS CAPS, Take 1,000 Units by mouth daily., Disp: , Rfl:  .  ezetimibe (ZETIA) 10 MG tablet, Take 1 tablet (10 mg total) by mouth daily., Disp: 90 tablet, Rfl: 3 .  KRILL OIL 1000 MG CAPS, Take 1 capsule by mouth daily.  , Disp: , Rfl:  .  losartan (COZAAR) 50 MG tablet, Take 1 tablet (50 mg total) by mouth daily., Disp: 90 tablet, Rfl: 3 .  Multiple Vitamins-Minerals (MULTIVITAMIN WITH MINERALS) tablet, Take 1 tablet by mouth daily.  , Disp: , Rfl:  .  nebivolol (BYSTOLIC) 5 MG tablet, Take 1 tablet (5 mg total) by mouth daily., Disp: 90 tablet, Rfl: 3 .  nicotine (NICODERM CQ - DOSED IN MG/24 HOURS) 14 mg/24hr patch, Place 14 mg onto the skin daily., Disp: , Rfl:  .  nystatin (MYCOSTATIN) powder, Apply 1 application topically as needed., Disp: , Rfl:  .  rosuvastatin (CRESTOR) 20 MG tablet, take  1 tablet by mouth at bedtime PT WANTS 90 DAYS, Disp: 90 tablet, Rfl: 3 .  buPROPion (WELLBUTRIN XL) 150 MG 24 hr tablet, Take 1 tablet (150 mg total) by mouth daily., Disp: 30 tablet, Rfl: 3  Allergies  Allergen Reactions  . Hydrocodone-Acetaminophen Rash     Review of Systems  Constitutional: Negative for fever, chills, weight loss and malaise/fatigue.  HENT: Negative for hearing loss.   Eyes: Negative for blurred vision and double vision.  Respiratory: Negative for cough, shortness of breath and wheezing.   Cardiovascular: Negative for chest pain, palpitations and leg swelling.  Gastrointestinal: Negative for heartburn, abdominal pain and blood in stool.  Genitourinary: Negative for dysuria, urgency and frequency.  Musculoskeletal: Negative for myalgias.  Skin: Negative for rash.  Neurological: Negative for dizziness, tremors, weakness and headaches.  Psychiatric/Behavioral: The patient is nervous/anxious.       Objective  Filed Vitals:   06/03/15 1524  BP: 120/70   Pulse: 80  Temp: 99.1 F (37.3 C)  TempSrc: Oral  Resp: 16  Height: 5\' 6"  (1.676 m)  Weight: 168 lb 9.6 oz (76.476 kg)    Physical Exam  Constitutional: She is oriented to person, place, and time and well-developed, well-nourished, and in no distress. No distress.  HENT:  Head: Normocephalic and atraumatic.  Eyes: Conjunctivae and EOM are normal. Pupils are equal, round, and reactive to light. No scleral icterus.  Neck: Normal range of motion. Neck supple. Carotid bruit is not present. No thyromegaly present.  Cardiovascular: Normal rate and regular rhythm.  Exam reveals no gallop and no friction rub.   No murmur heard. Pulmonary/Chest: Effort normal and breath sounds normal. No respiratory distress. She has no wheezes. She has no rales.  Abdominal: Soft. Bowel sounds are normal. She exhibits no distension, no abdominal bruit and no mass. There is no tenderness.  Musculoskeletal: She exhibits no edema.  Lymphadenopathy:    She has no cervical adenopathy.  Neurological: She is alert and oriented to person, place, and time.  Psychiatric:  Patient's affect is anxious.  No suicidal ideations  Vitals reviewed.      No results found for this or any previous visit (from the past 2160 hour(s)).   Assessment & Plan  Problem List Items Addressed This Visit      Cardiovascular and Mediastinum   ESSENTIAL HYPERTENSION, MALIGNANT - Primary   Relevant Medications   ezetimibe (ZETIA) 10 MG tablet   rosuvastatin (CRESTOR) 20 MG tablet   nebivolol (BYSTOLIC) 5 MG tablet   losartan (COZAAR) 50 MG tablet     Other   HYPERLIPIDEMIA, MIXED   Relevant Medications   ezetimibe (ZETIA) 10 MG tablet   rosuvastatin (CRESTOR) 20 MG tablet   nebivolol (BYSTOLIC) 5 MG tablet   losartan (COZAAR) 50 MG tablet   Sinus tachycardia (HCC)   Relevant Medications   nebivolol (BYSTOLIC) 5 MG tablet   Acute anxiety   Relevant Medications   buPROPion (WELLBUTRIN XL) 150 MG 24 hr tablet       Meds ordered this encounter  Medications  . buPROPion (WELLBUTRIN XL) 150 MG 24 hr tablet    Sig: Take 1 tablet (150 mg total) by mouth daily.    Dispense:  30 tablet    Refill:  3  . ezetimibe (ZETIA) 10 MG tablet    Sig: Take 1 tablet (10 mg total) by mouth daily.    Dispense:  90 tablet    Refill:  3  . rosuvastatin (CRESTOR) 20 MG  tablet    Sig: take 1 tablet by mouth at bedtime PT WANTS 90 DAYS    Dispense:  90 tablet    Refill:  3  . nebivolol (BYSTOLIC) 5 MG tablet    Sig: Take 1 tablet (5 mg total) by mouth daily.    Dispense:  90 tablet    Refill:  3  . losartan (COZAAR) 50 MG tablet    Sig: Take 1 tablet (50 mg total) by mouth daily.    Dispense:  90 tablet    Refill:  3   1. Essential hypertension, malignant  - losartan (COZAAR) 50 MG tablet; Take 1 tablet (50 mg total) by mouth daily.  Dispense: 90 tablet; Refill: 3  2. Sinus tachycardia (HCC)  - nebivolol (BYSTOLIC) 5 MG tablet; Take 1 tablet (5 mg total) by mouth daily.  Dispense: 90 tablet; Refill: 3  3. Acute anxiety  - buPROPion (WELLBUTRIN XL) 150 MG 24 hr tablet; Take 1 tablet (150 mg total) by mouth daily.  Dispense: 30 tablet; Refill: 3  4. HYPERLIPIDEMIA, MIXED - ezetimibe (ZETIA) 10 MG tablet; Take 1 tablet (10 mg total) by mouth daily.  Dispense: 90 tablet; Refill: 3 - rosuvastatin (CRESTOR) 20 MG tablet; take 1 tablet by mouth at bedtime PT WANTS 90 DAYS  Dispense: 90 tablet; Refill: 3

## 2015-07-22 ENCOUNTER — Ambulatory Visit (INDEPENDENT_AMBULATORY_CARE_PROVIDER_SITE_OTHER): Payer: BLUE CROSS/BLUE SHIELD | Admitting: Family Medicine

## 2015-07-22 ENCOUNTER — Encounter: Payer: Self-pay | Admitting: Family Medicine

## 2015-07-22 VITALS — BP 118/75 | HR 76 | Temp 98.6°F | Resp 16 | Ht 66.0 in | Wt 168.6 lb

## 2015-07-22 DIAGNOSIS — R000 Tachycardia, unspecified: Secondary | ICD-10-CM

## 2015-07-22 DIAGNOSIS — I10 Essential (primary) hypertension: Secondary | ICD-10-CM | POA: Diagnosis not present

## 2015-07-22 DIAGNOSIS — F419 Anxiety disorder, unspecified: Secondary | ICD-10-CM | POA: Diagnosis not present

## 2015-07-22 DIAGNOSIS — E782 Mixed hyperlipidemia: Secondary | ICD-10-CM | POA: Diagnosis not present

## 2015-07-22 MED ORDER — CARVEDILOL 6.25 MG PO TABS: 6.2500 mg | ORAL_TABLET | Freq: Two times a day (BID) | ORAL | Status: DC

## 2015-07-22 MED ORDER — LOSARTAN POTASSIUM 50 MG PO TABS: 50.0000 mg | ORAL_TABLET | Freq: Every day | ORAL | Status: DC

## 2015-07-22 NOTE — Progress Notes (Signed)
Name: Lauren Cervantes   MRN: [id number]    DOB: 10/16/1955   Date:07/22/2015       Progress Note  Subjective  Chief Complaint  Chief Complaint  Patient presents with  . Follow-up    Blood pressure    HPI Here for f/u of HBP.  She stopped the Wellbutrin b/o feeling depressed.  She has had stress sec. to death of her brother.   She has taken 1/2 Valium from her sister on rare occasion.  Still very anxious and depressed re: dealing with her brother's death. Doesn't want to discuss any other medication for the stress.  Still trying to stop smoking.  C/o cost of Bystolic  No problem-specific assessment & plan notes found for this encounter.   Past Medical History  Diagnosis Date  . Breast cancer ([place]) 2010  . Hyperlipidemia   . Hypertension   . Lymphedema of arm     left arm.    Past Surgical History  Procedure Laterality Date  . Total abdominal hysterectomy    . Breast lumpectomy      Family History  Problem Relation Age of Onset  . Cancer    . Coronary artery disease    . Stroke    . Hyperlipidemia    . Hypertension    . Hypertension Mother   . Hypertension Father   . Stroke Father     Social History   Social History  . Marital Status: Significant Other    Spouse Name: N/A  . Number of Children: N/A  . Years of Education: N/A   Occupational History  . Writer    Social History Main Topics  . Smoking status: Current Some Day Smoker -- 0.25 packs/day for 30 years    Types: Cigarettes    Last Attempt to Quit: 05/04/2011  . Smokeless tobacco: Never Used     Comment: Patient using the electronic cigarette  . Alcohol Use: 1.1 oz/week    1 Glasses of wine, 1 Standard drinks or equivalent per week     Comment: 1 a day most days  . Drug Use: No  . Sexual Activity: Not on file   Other Topics Concern  . Not on file   Social History Narrative   Regular exercise. Getting back into after cancer           Current outpatient  prescriptions:  .  acyclovir (ZOVIRAX) 400 MG tablet, Take 400 mg by mouth 2 (two) times daily as needed., Disp: , Rfl:  .  anastrozole (ARIMIDEX) 1 MG tablet, Take 1 mg by mouth daily.  , Disp: , Rfl:  .  Calcium Carb-Cholecalciferol (CALCIUM 1000 + D PO), Take 1 tablet by mouth daily. Vitamin D 400mg  , Disp: , Rfl:  .  Cholecalciferol (VITAMIN D3) 1000 UNITS CAPS, Take 1,000 Units by mouth daily., Disp: , Rfl:  .  ezetimibe (ZETIA) 10 MG tablet, Take 1 tablet (10 mg total) by mouth daily., Disp: 90 tablet, Rfl: 3 .  KRILL OIL 1000 MG CAPS, Take 1 capsule by mouth daily.  , Disp: , Rfl:  .  losartan (COZAAR) 50 MG tablet, Take 1 tablet (50 mg total) by mouth daily., Disp: 90 tablet, Rfl: 3 .  Multiple Vitamins-Minerals (MULTIVITAMIN WITH MINERALS) tablet, Take 1 tablet by mouth daily.  , Disp: , Rfl:  .  nebivolol (BYSTOLIC) 5 MG tablet, Take 1 tablet (5 mg total) by mouth daily., Disp: 90 tablet, Rfl: 3 .  nicotine (  NICODERM CQ - DOSED IN MG/24 HOURS) 14 mg/24hr patch, Place 14 mg onto the skin daily., Disp: , Rfl:  .  nystatin (MYCOSTATIN) powder, Apply 1 application topically as needed., Disp: , Rfl:  .  rosuvastatin (CRESTOR) 20 MG tablet, take 1 tablet by mouth at bedtime PT WANTS 90 DAYS, Disp: 90 tablet, Rfl: 3 .  buPROPion (WELLBUTRIN XL) 150 MG 24 hr tablet, Take 1 tablet (150 mg total) by mouth daily. (Patient not taking: Reported on 07/22/2015), Disp: 30 tablet, Rfl: 3 .  carvedilol (COREG) 6.25 MG tablet, Take 1 tablet (6.25 mg total) by mouth 2 (two) times daily with a meal., Disp: 60 tablet, Rfl: 3  Allergies  Allergen Reactions  . Hydrocodone-Acetaminophen Rash     Review of Systems  Constitutional: Negative for fever, chills, weight loss and malaise/fatigue.  HENT: Negative for hearing loss.   Eyes: Negative for blurred vision and double vision.  Respiratory: Negative for cough, shortness of breath and wheezing.   Cardiovascular: Negative for chest pain, palpitations and  leg swelling.  Gastrointestinal: Negative for heartburn, abdominal pain and blood in stool.  Genitourinary: Negative for dysuria, urgency and frequency.  Musculoskeletal: Negative for myalgias and joint pain.  Skin: Negative for rash.  Neurological: Negative for tremors, weakness and headaches.  Psychiatric/Behavioral: Positive for depression. The patient is nervous/anxious and has insomnia.       Objective  Filed Vitals:   07/22/15 0840  BP: 118/75  Pulse: 76  Temp: 98.6 F (37 C)  TempSrc: Oral  Resp: 16  Height: 5\' 6"  (1.676 m)  Weight: 168 lb 9.6 oz (76.476 kg)    Physical Exam  Constitutional: She is oriented to person, place, and time and well-developed, well-nourished, and in no distress. No distress.  HENT:  Head: Normocephalic and atraumatic.  Eyes: Conjunctivae and EOM are normal. Pupils are equal, round, and reactive to light. No scleral icterus.  Neck: Normal range of motion. Neck supple. Carotid bruit is not present. No thyromegaly present.  Cardiovascular: Normal rate, regular rhythm and normal heart sounds.  Exam reveals no gallop and no friction rub.   No murmur heard. Pulmonary/Chest: Effort normal and breath sounds normal. No respiratory distress. She has no wheezes. She has no rales.  Abdominal: Soft. Bowel sounds are normal. She exhibits no distension, no abdominal bruit and no mass. There is no tenderness.  Musculoskeletal: She exhibits no edema.  Lymphadenopathy:    She has no cervical adenopathy.  Neurological: She is alert and oriented to person, place, and time.       No results found for this or any previous visit (from the past 2160 hour(s)).   Assessment & Plan  Problem List Items Addressed This Visit      Cardiovascular and Mediastinum   ESSENTIAL HYPERTENSION, MALIGNANT - Primary   Relevant Medications   carvedilol (COREG) 6.25 MG tablet   losartan (COZAAR) 50 MG tablet     Other   HYPERLIPIDEMIA, MIXED   Relevant Medications    carvedilol (COREG) 6.25 MG tablet   losartan (COZAAR) 50 MG tablet   Sinus tachycardia (HCC)   Relevant Medications   carvedilol (COREG) 6.25 MG tablet   Acute anxiety      Meds ordered this encounter  Medications  . carvedilol (COREG) 6.25 MG tablet    Sig: Take 1 tablet (6.25 mg total) by mouth 2 (two) times daily with a meal.    Dispense:  60 tablet    Refill:  3  . losartan (  COZAAR) 50 MG tablet    Sig: Take 1 tablet (50 mg total) by mouth daily.    Dispense:  90 tablet    Refill:  3   1. Essential hypertension, malignant -stop Bystolic. - carvedilol (COREG) 6.25 MG tablet; Take 1 tablet (6.25 mg total) by mouth 2 (two) times daily with a meal.  Dispense: 60 tablet; Refill: 3 - losartan (COZAAR) 50 MG tablet; Take 1 tablet (50 mg total) by mouth daily.  Dispense: 90 tablet; Refill: 3  2. Sinus tachycardia (HCC)  - carvedilol (COREG) 6.25 MG tablet; Take 1 tablet (6.25 mg total) by mouth 2 (two) times daily with a meal.  Dispense: 60 tablet; Refill: 3  3. HYPERLIPIDEMIA, MIXED   4. Acute anxiety

## 2015-08-12 ENCOUNTER — Telehealth: Payer: Self-pay | Admitting: Cardiovascular Disease

## 2015-08-12 NOTE — Telephone Encounter (Signed)
Patient is seeing pcp and he is taking care of everything.  She does not see the sense in seeing 2 doctors.  Patient not interested in fu app. Deleting recall.

## 2015-09-08 ENCOUNTER — Encounter: Payer: Self-pay | Admitting: Family Medicine

## 2015-09-08 ENCOUNTER — Ambulatory Visit (INDEPENDENT_AMBULATORY_CARE_PROVIDER_SITE_OTHER): Payer: BLUE CROSS/BLUE SHIELD | Admitting: Family Medicine

## 2015-09-08 VITALS — BP 125/75 | HR 87 | Temp 98.6°F | Resp 16 | Ht 66.0 in | Wt 169.0 lb

## 2015-09-08 DIAGNOSIS — I10 Essential (primary) hypertension: Secondary | ICD-10-CM | POA: Diagnosis not present

## 2015-09-08 DIAGNOSIS — R000 Tachycardia, unspecified: Secondary | ICD-10-CM | POA: Diagnosis not present

## 2015-09-08 MED ORDER — CARVEDILOL 12.5 MG PO TABS: 12.5000 mg | ORAL_TABLET | Freq: Two times a day (BID) | ORAL | Status: DC

## 2015-09-08 NOTE — Progress Notes (Signed)
Name: Lauren Cervantes   MRN: [id number]    DOB: 12/14/1955   Date:09/08/2015       Progress Note  Subjective  Chief Complaint  Chief Complaint  Patient presents with  . Hypertension    HPI For f/u of HBP.  Has elevated lipids.  She rep[orts that she is on 14 mg nicotine patch and has not had a any cigarettes in 2 days.  No problem-specific assessment & plan notes found for this encounter.   Past Medical History  Diagnosis Date  . Breast cancer ([place]) 2010  . Hyperlipidemia   . Hypertension   . Lymphedema of arm     left arm.    Past Surgical History  Procedure Laterality Date  . Total abdominal hysterectomy    . Breast lumpectomy      Family History  Problem Relation Age of Onset  . Cancer    . Coronary artery disease    . Stroke    . Hyperlipidemia    . Hypertension    . Hypertension Mother   . Hypertension Father   . Stroke Father     Social History   Social History  . Marital Status: Significant Other    Spouse Name: N/A  . Number of Children: N/A  . Years of Education: N/A   Occupational History  . Writer    Social History Main Topics  . Smoking status: Current Some Day Smoker -- 0.25 packs/day for 30 years    Types: Cigarettes    Last Attempt to Quit: 05/04/2011  . Smokeless tobacco: Never Used     Comment: Patient using the electronic cigarette  . Alcohol Use: 1.1 oz/week    1 Glasses of wine, 1 Standard drinks or equivalent per week     Comment: 1 a day most days  . Drug Use: No  . Sexual Activity: Not on file   Other Topics Concern  . Not on file   Social History Narrative   Regular exercise. Getting back into after cancer           Current outpatient prescriptions:  .  acyclovir (ZOVIRAX) 400 MG tablet, Take 400 mg by mouth 2 (two) times daily as needed., Disp: , Rfl:  .  anastrozole (ARIMIDEX) 1 MG tablet, Take 1 mg by mouth daily.  , Disp: , Rfl:  .  buPROPion (WELLBUTRIN XL) 150 MG 24 hr tablet, Take  1 tablet (150 mg total) by mouth daily., Disp: 30 tablet, Rfl: 3 .  Calcium Carb-Cholecalciferol (CALCIUM 1000 + D PO), Take 1 tablet by mouth daily. Vitamin D 400mg  , Disp: , Rfl:  .  carvedilol (COREG) 12.5 MG tablet, Take 1 tablet (12.5 mg total) by mouth 2 (two) times daily with a meal., Disp: 180 tablet, Rfl: 3 .  Cholecalciferol (VITAMIN D3) 1000 UNITS CAPS, Take 1,000 Units by mouth daily., Disp: , Rfl:  .  ezetimibe (ZETIA) 10 MG tablet, Take 1 tablet (10 mg total) by mouth daily., Disp: 90 tablet, Rfl: 3 .  KRILL OIL 1000 MG CAPS, Take 1 capsule by mouth daily.  , Disp: , Rfl:  .  losartan (COZAAR) 50 MG tablet, Take 1 tablet (50 mg total) by mouth daily., Disp: 90 tablet, Rfl: 3 .  Multiple Vitamins-Minerals (MULTIVITAMIN WITH MINERALS) tablet, Take 1 tablet by mouth daily.  , Disp: , Rfl:  .  nicotine (NICODERM CQ - DOSED IN MG/24 HOURS) 14 mg/24hr patch, Place 14 mg onto the skin  daily., Disp: , Rfl:  .  nystatin (MYCOSTATIN) powder, Apply 1 application topically as needed., Disp: , Rfl:  .  rosuvastatin (CRESTOR) 20 MG tablet, take 1 tablet by mouth at bedtime PT WANTS 90 DAYS, Disp: 90 tablet, Rfl: 3 .  nebivolol (BYSTOLIC) 5 MG tablet, Take 1 tablet (5 mg total) by mouth daily. (Patient not taking: Reported on 09/08/2015), Disp: 90 tablet, Rfl: 3  Allergies  Allergen Reactions  . Hydrocodone-Acetaminophen Rash     Review of Systems  Constitutional: Negative for fever, chills, weight loss and malaise/fatigue.  HENT: Negative for hearing loss.   Eyes: Negative for blurred vision and double vision.  Respiratory: Negative for cough, hemoptysis, shortness of breath and wheezing.   Cardiovascular: Negative for chest pain, palpitations and leg swelling.  Gastrointestinal: Negative for heartburn, abdominal pain and blood in stool.  Genitourinary: Negative for dysuria, urgency and frequency.  Skin: Negative for rash.  Neurological: Negative for weakness and headaches.       Objective  Filed Vitals:   09/08/15 1509 09/08/15 1540  BP: 135/79 125/75  Pulse: 87   Temp: 98.6 F (37 C)   TempSrc: Oral   Resp: 16   Height: 5\' 6"  (1.676 m)   Weight: 169 lb (76.658 kg)     Physical Exam  Constitutional: She is oriented to person, place, and time and well-developed, well-nourished, and in no distress. No distress.  HENT:  Head: Normocephalic and atraumatic.  Eyes: Conjunctivae and EOM are normal. Pupils are equal, round, and reactive to light. No scleral icterus.  Neck: Normal range of motion. Neck supple. Carotid bruit is not present. No thyromegaly present.  Cardiovascular: Normal rate, regular rhythm and normal heart sounds.  Exam reveals no gallop and no friction rub.   No murmur heard. Pulmonary/Chest: Effort normal and breath sounds normal. No respiratory distress. She has no wheezes. She has no rales.  Abdominal: Soft. Bowel sounds are normal. She exhibits no distension and no mass. There is no tenderness.  Musculoskeletal: Normal range of motion. She exhibits no edema.  Lymphadenopathy:    She has no cervical adenopathy.  Neurological: She is alert and oriented to person, place, and time.  Vitals reviewed.      No results found for this or any previous visit (from the past 2160 hour(s)).   Assessment & Plan  Problem List Items Addressed This Visit      Cardiovascular and Mediastinum   ESSENTIAL HYPERTENSION, MALIGNANT - Primary   Relevant Medications   carvedilol (COREG) 12.5 MG tablet     Other   Sinus tachycardia (HCC)   Relevant Medications   carvedilol (COREG) 12.5 MG tablet      Meds ordered this encounter  Medications  . DISCONTD: anastrozole (ARIMIDEX) 1 MG tablet    Sig:   . carvedilol (COREG) 12.5 MG tablet    Sig: Take 1 tablet (12.5 mg total) by mouth 2 (two) times daily with a meal.    Dispense:  180 tablet    Refill:  3   1. Essential hypertension, malignant Cont Losartan - carvedilol (COREG) 12.5 MG  tablet; Take 1 tablet (12.5 mg total) by mouth 2 (two) times daily with a meal.  Dispense: 180 tablet; Refill: 3  2. Sinus tachycardia (HCC) - carvedilol (COREG) 12.5 MG tablet; Take 1 tablet (12.5 mg total) by mouth 2 (two) times daily with a meal.  Dispense: 180 tablet; Refill: 3

## 2015-11-24 ENCOUNTER — Encounter: Payer: Self-pay | Admitting: Family Medicine

## 2015-11-24 ENCOUNTER — Ambulatory Visit (INDEPENDENT_AMBULATORY_CARE_PROVIDER_SITE_OTHER): Payer: BLUE CROSS/BLUE SHIELD | Admitting: Family Medicine

## 2015-11-24 VITALS — BP 120/80 | HR 70 | Temp 98.2°F | Resp 16 | Ht 66.0 in | Wt 168.6 lb

## 2015-11-24 DIAGNOSIS — R000 Tachycardia, unspecified: Secondary | ICD-10-CM

## 2015-11-24 DIAGNOSIS — C50912 Malignant neoplasm of unspecified site of left female breast: Secondary | ICD-10-CM | POA: Diagnosis not present

## 2015-11-24 DIAGNOSIS — E782 Mixed hyperlipidemia: Secondary | ICD-10-CM

## 2015-11-24 DIAGNOSIS — M5441 Lumbago with sciatica, right side: Secondary | ICD-10-CM | POA: Diagnosis not present

## 2015-11-24 DIAGNOSIS — I10 Essential (primary) hypertension: Secondary | ICD-10-CM

## 2015-11-24 DIAGNOSIS — M545 Low back pain, unspecified: Secondary | ICD-10-CM | POA: Insufficient documentation

## 2015-11-24 MED ORDER — METAXALONE 800 MG PO TABS: 800.0000 mg | ORAL_TABLET | Freq: Three times a day (TID) | ORAL | 1 refills | Status: DC

## 2015-11-24 NOTE — Progress Notes (Signed)
Name: Lauren Cervantes   MRN: [id number]    DOB: 02/24/1956   Date:11/24/2015       Progress Note  Subjective  Chief Complaint  Chief Complaint  Patient presents with  . Hypertension    HPI Here for f/u of HBP.  Taking all meds.    Strained back 2 days ago.  Slowly getting better.  No problem-specific Assessment & Plan notes found for this encounter.   Past Medical History:  Diagnosis Date  . Breast cancer ([place]) 2010  . Hyperlipidemia   . Hypertension   . Lymphedema of arm    left arm.    Past Surgical History:  Procedure Laterality Date  . BREAST LUMPECTOMY    . TOTAL ABDOMINAL HYSTERECTOMY      Family History  Problem Relation Age of Onset  . Hypertension Mother   . Hypertension Father   . Stroke Father   . Cancer    . Coronary artery disease    . Stroke    . Hyperlipidemia    . Hypertension      Social History   Social History  . Marital status: Significant Other    Spouse name: N/A  . Number of children: N/A  . Years of education: N/A   Occupational History  . Writer    Social History Main Topics  . Smoking status: Current Some Day Smoker    Packs/day: 0.25    Years: 30.00    Types: Cigarettes    Last attempt to quit: 05/04/2011  . Smokeless tobacco: Never Used     Comment: Patient using the electronic cigarette  . Alcohol use 1.1 oz/week    1 Glasses of wine, 1 Standard drinks or equivalent per week     Comment: 1 a day most days  . Drug use: No  . Sexual activity: Not on file   Other Topics Concern  . Not on file   Social History Narrative   Regular exercise. Getting back into after cancer           Current Outpatient Prescriptions:  .  acyclovir (ZOVIRAX) 400 MG tablet, Take 400 mg by mouth 2 (two) times daily as needed., Disp: , Rfl:  .  anastrozole (ARIMIDEX) 1 MG tablet, Take 1 mg by mouth daily.  , Disp: , Rfl:  .  Calcium Carb-Cholecalciferol (CALCIUM 1000 + D PO), Take 1 tablet by mouth daily. Vitamin  D 400mg  , Disp: , Rfl:  .  carvedilol (COREG) 12.5 MG tablet, Take 1 tablet (12.5 mg total) by mouth 2 (two) times daily with a meal., Disp: 180 tablet, Rfl: 3 .  Cholecalciferol (VITAMIN D3) 1000 UNITS CAPS, Take 1,000 Units by mouth daily., Disp: , Rfl:  .  ezetimibe (ZETIA) 10 MG tablet, Take 1 tablet (10 mg total) by mouth daily., Disp: 90 tablet, Rfl: 3 .  KRILL OIL 1000 MG CAPS, Take 1 capsule by mouth daily.  , Disp: , Rfl:  .  losartan (COZAAR) 50 MG tablet, Take 1 tablet (50 mg total) by mouth daily., Disp: 90 tablet, Rfl: 3 .  Multiple Vitamins-Minerals (MULTIVITAMIN WITH MINERALS) tablet, Take 1 tablet by mouth daily.  , Disp: , Rfl:  .  nicotine (NICODERM CQ - DOSED IN MG/24 HOURS) 14 mg/24hr patch, Place 14 mg onto the skin daily., Disp: , Rfl:  .  nystatin (MYCOSTATIN) powder, Apply 1 application topically as needed., Disp: , Rfl:  .  rosuvastatin (CRESTOR) 20 MG tablet, take 1  tablet by mouth at bedtime PT WANTS 90 DAYS, Disp: 90 tablet, Rfl: 3 .  metaxalone (SKELAXIN) 800 MG tablet, Take 1 tablet (800 mg total) by mouth 3 (three) times daily., Disp: 30 tablet, Rfl: 1  Allergies  Allergen Reactions  . Hydrocodone-Acetaminophen Rash     Review of Systems  Constitutional: Negative for chills, diaphoresis, fever and weight loss.  HENT: Negative for hearing loss.   Eyes: Negative for blurred vision and double vision.  Respiratory: Negative for cough, shortness of breath and wheezing.   Cardiovascular: Negative for chest pain, palpitations and leg swelling.  Gastrointestinal: Negative for abdominal pain, blood in stool and heartburn.  Genitourinary: Negative for dysuria, frequency and urgency.  Musculoskeletal: Positive for back pain and myalgias.  Neurological: Negative for dizziness, tremors, weakness and headaches.      Objective  Vitals:   11/24/15 0804 11/24/15 0836  BP: 129/84 120/80  Pulse: 70   Resp: 16   Temp: 98.2 F (36.8 C)   TempSrc: Oral   Weight: 168  lb 9.6 oz (76.5 kg)   Height: 5\' 6"  (1.676 m)     Physical Exam  Constitutional: She is oriented to person, place, and time and well-developed, well-nourished, and in no distress. No distress.  HENT:  Head: Normocephalic and atraumatic.  Eyes: Conjunctivae and EOM are normal. Pupils are equal, round, and reactive to light. No scleral icterus.  Neck: Normal range of motion. Neck supple. Carotid bruit is not present. No thyromegaly present.  Cardiovascular: Normal rate, regular rhythm and normal heart sounds.  Exam reveals no gallop and no friction rub.   No murmur heard. Pulmonary/Chest: Effort normal and breath sounds normal. No respiratory distress. She has no wheezes. She has no rales.  Abdominal: Soft. Bowel sounds are normal. She exhibits no distension and no mass. There is no tenderness.  Musculoskeletal: She exhibits no edema.  R SI joint tenderness to palpation  Lymphadenopathy:    She has no cervical adenopathy.  Neurological: She is alert and oriented to person, place, and time.  Vitals reviewed.      No results found for this or any previous visit (from the past 2160 hour(s)).   Assessment & Plan  Problem List Items Addressed This Visit      Cardiovascular and Mediastinum   ESSENTIAL HYPERTENSION, MALIGNANT - Primary     Other   HYPERLIPIDEMIA, MIXED   Sinus tachycardia (HCC)   Breast cancer, left (HCC)   Low back pain   Relevant Medications   metaxalone (SKELAXIN) 800 MG tablet    Other Visit Diagnoses   None.     Meds ordered this encounter  Medications  . metaxalone (SKELAXIN) 800 MG tablet    Sig: Take 1 tablet (800 mg total) by mouth 3 (three) times daily.    Dispense:  30 tablet    Refill:  1    1. Essential hypertension, malignant Cont meds  2. Malignant neoplasm of left female breast, unspecified site of breast ([place])   3. HYPERLIPIDEMIA, MIXED Cont meds  4. Sinus tachycardia ([place])   5. Right-sided low back pain with right-sided  sciatica Cont to use OTC Aleve prn - metaxalone (SKELAXIN) 800 MG tablet; Take 1 tablet (800 mg total) by mouth 3 (three) times daily.  Dispense: 30 tablet; Refill: 1

## 2016-02-06 ENCOUNTER — Ambulatory Visit (INDEPENDENT_AMBULATORY_CARE_PROVIDER_SITE_OTHER): Payer: BLUE CROSS/BLUE SHIELD | Admitting: *Deleted

## 2016-02-06 DIAGNOSIS — Z23 Encounter for immunization: Secondary | ICD-10-CM | POA: Diagnosis not present

## 2016-02-13 ENCOUNTER — Telehealth: Payer: Self-pay | Admitting: *Deleted

## 2016-02-13 NOTE — Telephone Encounter (Signed)
Called patient to inquire about colorguard test. She says her insurance would not cover test. Nothing further needed.

## 2016-03-17 DIAGNOSIS — C50812 Malignant neoplasm of overlapping sites of left female breast: Secondary | ICD-10-CM | POA: Diagnosis not present

## 2016-03-29 ENCOUNTER — Ambulatory Visit (INDEPENDENT_AMBULATORY_CARE_PROVIDER_SITE_OTHER): Payer: BLUE CROSS/BLUE SHIELD | Admitting: Family Medicine

## 2016-03-29 ENCOUNTER — Encounter: Payer: Self-pay | Admitting: Family Medicine

## 2016-03-29 VITALS — BP 128/84 | HR 87 | Temp 97.7°F | Resp 16 | Ht 66.0 in | Wt 165.0 lb

## 2016-03-29 DIAGNOSIS — I10 Essential (primary) hypertension: Secondary | ICD-10-CM | POA: Diagnosis not present

## 2016-03-29 DIAGNOSIS — J0140 Acute pansinusitis, unspecified: Secondary | ICD-10-CM | POA: Diagnosis not present

## 2016-03-29 DIAGNOSIS — G43909 Migraine, unspecified, not intractable, without status migrainosus: Secondary | ICD-10-CM | POA: Insufficient documentation

## 2016-03-29 DIAGNOSIS — J40 Bronchitis, not specified as acute or chronic: Secondary | ICD-10-CM | POA: Diagnosis not present

## 2016-03-29 MED ORDER — DM-GUAIFENESIN ER 30-600 MG PO TB12: 1.0000 | ORAL_TABLET | Freq: Two times a day (BID) | ORAL | 1 refills | Status: DC | PRN

## 2016-03-29 MED ORDER — AMOXICILLIN-POT CLAVULANATE 875-125 MG PO TABS: 1.0000 | ORAL_TABLET | Freq: Two times a day (BID) | ORAL | 0 refills | Status: AC

## 2016-03-29 MED ORDER — PREDNISONE 10 MG PO TABS: ORAL_TABLET | ORAL | 0 refills | Status: DC

## 2016-03-29 MED ORDER — ALBUTEROL SULFATE HFA 108 (90 BASE) MCG/ACT IN AERS: 2.0000 | INHALATION_SPRAY | Freq: Four times a day (QID) | RESPIRATORY_TRACT | 3 refills | Status: DC | PRN

## 2016-03-29 MED ORDER — BENZONATATE 100 MG PO CAPS: 100.0000 mg | ORAL_CAPSULE | Freq: Three times a day (TID) | ORAL | 1 refills | Status: DC | PRN

## 2016-03-29 NOTE — Progress Notes (Signed)
Name: Lauren Cervantes   MRN: [id number]    DOB: Aug 25, 1955   Date:03/29/2016       Progress Note  Subjective  Chief Complaint  Chief Complaint  Patient presents with  . Hypertension  . Cough  . Sinusitis    HPI Here c/o a "cold" for 8-9 days with worsening cough that is deep.  Has been productive but now not.  Was getting better for a day or two, but now worse.  Has pressure in face and deep cough.  Still smoking 2-3 cigs a day. No problem-specific Assessment & Plan notes found for this encounter.   Past Medical History:  Diagnosis Date  . Breast cancer ([place]) 2010  . Hyperlipidemia   . Hypertension   . Lymphedema of arm    left arm.    Social History  Substance Use Topics  . Smoking status: Current Some Day Smoker    Packs/day: 0.25    Years: 30.00    Types: Cigarettes    Last attempt to quit: 05/04/2011  . Smokeless tobacco: Never Used     Comment: Patient using the electronic cigarette  . Alcohol use 1.1 oz/week    1 Glasses of wine, 1 Standard drinks or equivalent per week     Comment: 1 a day most days     Current Outpatient Prescriptions:  .  acyclovir (ZOVIRAX) 400 MG tablet, Take 400 mg by mouth 2 (two) times daily as needed., Disp: , Rfl:  .  anastrozole (ARIMIDEX) 1 MG tablet, Take 1 mg by mouth daily.  , Disp: , Rfl:  .  Calcium Carb-Cholecalciferol (CALCIUM 1000 + D PO), Take 1 tablet by mouth daily. Vitamin D 400mg  , Disp: , Rfl:  .  carvedilol (COREG) 12.5 MG tablet, Take 1 tablet (12.5 mg total) by mouth 2 (two) times daily with a meal., Disp: 180 tablet, Rfl: 3 .  Cholecalciferol (VITAMIN D3) 1000 UNITS CAPS, Take 1,000 Units by mouth daily., Disp: , Rfl:  .  ezetimibe (ZETIA) 10 MG tablet, Take 1 tablet (10 mg total) by mouth daily., Disp: 90 tablet, Rfl: 3 .  KRILL OIL 1000 MG CAPS, Take 1 capsule by mouth daily.  , Disp: , Rfl:  .  losartan (COZAAR) 50 MG tablet, Take 1 tablet (50 mg total) by mouth daily., Disp: 90 tablet, Rfl: 3 .  Multiple  Vitamins-Minerals (MULTIVITAMIN WITH MINERALS) tablet, Take 1 tablet by mouth daily.  , Disp: , Rfl:  .  nicotine (NICODERM CQ - DOSED IN MG/24 HOURS) 14 mg/24hr patch, Place 14 mg onto the skin daily., Disp: , Rfl:  .  nystatin (MYCOSTATIN) powder, Apply 1 application topically as needed., Disp: , Rfl:  .  rosuvastatin (CRESTOR) 20 MG tablet, take 1 tablet by mouth at bedtime PT WANTS 90 DAYS, Disp: 90 tablet, Rfl: 3  Allergies  Allergen Reactions  . Hydrocodone-Acetaminophen Rash    Review of Systems  Constitutional: Positive for malaise/fatigue. Negative for chills, fever and weight loss.  HENT: Positive for congestion and sinus pain. Negative for sore throat.   Eyes: Negative for blurred vision and double vision.  Respiratory: Positive for cough, sputum production and wheezing. Negative for shortness of breath.   Cardiovascular: Negative for chest pain, palpitations and leg swelling.  Gastrointestinal: Negative for abdominal pain, blood in stool and heartburn.  Genitourinary: Negative for dysuria, frequency and urgency.  Musculoskeletal: Negative for myalgias.  Skin: Negative for rash.  Neurological: Positive for weakness and headaches (frontal and maxicillary areas.). Negative  for dizziness, tingling and tremors.    Objective  Vitals:   03/29/16 0807  BP: 128/84  Pulse: 87  Resp: 16  Temp: 97.7 F (36.5 C)  TempSrc: Oral  Weight: 165 lb (74.8 kg)  Height: 5\' 6"  (1.676 m)     Physical Exam  Constitutional: She is oriented to person, place, and time and well-developed, well-nourished, and in no distress. No distress.  HENT:  Head: Normocephalic and atraumatic.  Right Ear: External ear normal.  Left Ear: External ear normal.  Nose: Mucosal edema and rhinorrhea present. Right sinus exhibits maxillary sinus tenderness and frontal sinus tenderness. Left sinus exhibits maxillary sinus tenderness and frontal sinus tenderness.  Mouth/Throat: Oropharynx is clear and moist.   Cardiovascular: Normal rate, regular rhythm and normal heart sounds.  Exam reveals no gallop and no friction rub.   No murmur heard. Pulmonary/Chest: Effort normal. No respiratory distress. She has wheezes. She has no rales.  Exp wheezes and coarse rhonchi  Neurological: She is alert and oriented to person, place, and time.  Vitals reviewed.     No results found for this or any previous visit (from the past 2160 hour(s)).   Assessment & Plan  1. Acute non-recurrent pansinusitis  - amoxicillin-clavulanate (AUGMENTIN) 875-125 MG tablet; Take 1 tablet by mouth 2 (two) times daily.  Dispense: 14 tablet; Refill: 0 - predniSONE (DELTASONE) 10 MG tablet; Take 3 tabs a day for 2 days, then 2 tabs a day for 2 days then 1 tab a day for 2 days (3, 3, 2, 2, 1, 1.)  Dispense: 12 tablet; Refill: 0  2. Bronchitis  - amoxicillin-clavulanate (AUGMENTIN) 875-125 MG tablet; Take 1 tablet by mouth 2 (two) times daily.  Dispense: 14 tablet; Refill: 0 - albuterol (PROVENTIL HFA;VENTOLIN HFA) 108 (90 Base) MCG/ACT inhaler; Inhale 2 puffs into the lungs every 6 (six) hours as needed for wheezing or shortness of breath.  Dispense: 1 Inhaler; Refill: 3 - predniSONE (DELTASONE) 10 MG tablet; Take 3 tabs a day for 2 days, then 2 tabs a day for 2 days then 1 tab a day for 2 days (3, 3, 2, 2, 1, 1.)  Dispense: 12 tablet; Refill: 0 - dextromethorphan-guaiFENesin (MUCINEX DM) 30-600 MG 12hr tablet; Take 1 tablet by mouth 2 (two) times daily as needed for cough.  Dispense: 20 tablet; Refill: 1 - benzonatate (TESSALON) 100 MG capsule; Take 1 capsule (100 mg total) by mouth 3 (three) times daily as needed for cough.  Dispense: 30 capsule; Refill: 1  3. Essential hypertension, malignant Cont. Current meds

## 2016-05-11 ENCOUNTER — Encounter: Payer: Self-pay | Admitting: Family Medicine

## 2016-05-11 ENCOUNTER — Ambulatory Visit (INDEPENDENT_AMBULATORY_CARE_PROVIDER_SITE_OTHER): Payer: BLUE CROSS/BLUE SHIELD | Admitting: Family Medicine

## 2016-05-11 VITALS — BP 150/85 | HR 70 | Temp 98.2°F | Resp 16 | Ht 66.0 in | Wt 166.0 lb

## 2016-05-11 DIAGNOSIS — R000 Tachycardia, unspecified: Secondary | ICD-10-CM | POA: Diagnosis not present

## 2016-05-11 DIAGNOSIS — E782 Mixed hyperlipidemia: Secondary | ICD-10-CM | POA: Diagnosis not present

## 2016-05-11 DIAGNOSIS — F419 Anxiety disorder, unspecified: Secondary | ICD-10-CM | POA: Diagnosis not present

## 2016-05-11 DIAGNOSIS — I10 Essential (primary) hypertension: Secondary | ICD-10-CM | POA: Diagnosis not present

## 2016-05-11 MED ORDER — CARVEDILOL 25 MG PO TABS: 25.0000 mg | ORAL_TABLET | Freq: Two times a day (BID) | ORAL | 3 refills | Status: DC

## 2016-05-11 NOTE — Progress Notes (Signed)
Name: Lauren Cervantes   MRN: [id number]    DOB: Dec 15, 1955   Date:05/11/2016       Progress Note  Subjective  Chief Complaint  Chief Complaint  Patient presents with  . Hypertension    ranges 125/82    HPI Here for f/u of HBP.  She still smokes rare cigarette.  She sometimes forgets evening dose of Carvedilol and Crestor..  Otherwise taking all meds and feels well overall.  No problem-specific Assessment & Plan notes found for this encounter.   Past Medical History:  Diagnosis Date  . Breast cancer ([place]) 2010  . Hyperlipidemia   . Hypertension   . Lymphedema of arm    left arm.    Past Surgical History:  Procedure Laterality Date  . BREAST LUMPECTOMY    . TOTAL ABDOMINAL HYSTERECTOMY      Family History  Problem Relation Age of Onset  . Hypertension Mother   . Hypertension Father   . Stroke Father   . Cancer    . Coronary artery disease    . Stroke    . Hyperlipidemia    . Hypertension      Social History   Social History  . Marital status: Significant Other    Spouse name: N/A  . Number of children: N/A  . Years of education: N/A   Occupational History  . Writer    Social History Main Topics  . Smoking status: Current Some Day Smoker    Packs/day: 0.25    Years: 30.00    Types: Cigarettes    Last attempt to quit: 05/04/2011  . Smokeless tobacco: Current User     Comment: Patient using the electronic cigarette  . Alcohol use 1.1 oz/week    1 Glasses of wine, 1 Standard drinks or equivalent per week     Comment: 1 a day most days  . Drug use: No  . Sexual activity: Not on file   Other Topics Concern  . Not on file   Social History Narrative   Regular exercise. Getting back into after cancer           Current Outpatient Prescriptions:  .  acyclovir (ZOVIRAX) 400 MG tablet, Take 400 mg by mouth 2 (two) times daily as needed., Disp: , Rfl:  .  albuterol (PROVENTIL HFA;VENTOLIN HFA) 108 (90 Base) MCG/ACT inhaler, Inhale  2 puffs into the lungs every 6 (six) hours as needed for wheezing or shortness of breath., Disp: 1 Inhaler, Rfl: 3 .  anastrozole (ARIMIDEX) 1 MG tablet, Take 1 mg by mouth daily.  , Disp: , Rfl:  .  Calcium Carb-Cholecalciferol (CALCIUM 1000 + D PO), Take 1 tablet by mouth daily. Vitamin D 400mg  , Disp: , Rfl:  .  carvedilol (COREG) 25 MG tablet, Take 1 tablet (25 mg total) by mouth 2 (two) times daily with a meal., Disp: 180 tablet, Rfl: 3 .  Cholecalciferol (VITAMIN D3) 1000 UNITS CAPS, Take 1,000 Units by mouth daily., Disp: , Rfl:  .  ezetimibe (ZETIA) 10 MG tablet, Take 1 tablet (10 mg total) by mouth daily., Disp: 90 tablet, Rfl: 3 .  KRILL OIL 1000 MG CAPS, Take 1 capsule by mouth daily.  , Disp: , Rfl:  .  losartan (COZAAR) 50 MG tablet, Take 1 tablet (50 mg total) by mouth daily., Disp: 90 tablet, Rfl: 3 .  Multiple Vitamins-Minerals (MULTIVITAMIN WITH MINERALS) tablet, Take 1 tablet by mouth daily.  , Disp: , Rfl:  .  nicotine (NICODERM CQ - DOSED IN MG/24 HOURS) 14 mg/24hr patch, Place 14 mg onto the skin daily., Disp: , Rfl:  .  nystatin (MYCOSTATIN) powder, Apply 1 application topically as needed., Disp: , Rfl:  .  rosuvastatin (CRESTOR) 20 MG tablet, take 1 tablet by mouth at bedtime PT WANTS 90 DAYS, Disp: 90 tablet, Rfl: 3  Allergies  Allergen Reactions  . Hydrocodone-Acetaminophen Rash     Review of Systems  Constitutional: Negative for chills, fever, malaise/fatigue and weight loss.  HENT: Negative for congestion, hearing loss and tinnitus.   Eyes: Negative for blurred vision and double vision.  Respiratory: Negative for cough, shortness of breath and wheezing.   Cardiovascular: Negative for chest pain, palpitations and leg swelling.  Gastrointestinal: Negative for abdominal pain, blood in stool and heartburn.  Genitourinary: Negative for dysuria, frequency and urgency.  Musculoskeletal: Negative for joint pain and myalgias.  Skin: Negative for rash.  Neurological:  Positive for headaches (some from eye strain.). Negative for dizziness, tingling, tremors and weakness.      Objective  Vitals:   05/11/16 0826 05/11/16 0913  BP: 138/88 (!) 150/85  Pulse: 70   Resp: 16   Temp: 98.2 F (36.8 C)   TempSrc: Oral   Weight: 166 lb (75.3 kg)   Height: 5\' 6"  (1.676 m)     Physical Exam  Constitutional: She is oriented to person, place, and time and well-developed, well-nourished, and in no distress. No distress.  HENT:  Head: Normocephalic and atraumatic.  Eyes: Conjunctivae and EOM are normal. Pupils are equal, round, and reactive to light. No scleral icterus.  Neck: Normal range of motion. Neck supple. Carotid bruit is not present. No thyromegaly present.  Cardiovascular: Normal rate and regular rhythm.  Exam reveals no gallop and no friction rub.   No murmur heard. Pulmonary/Chest: Effort normal and breath sounds normal. No respiratory distress. She has no wheezes. She has no rales.  Abdominal: Soft. Bowel sounds are normal. She exhibits no distension and no mass. There is no tenderness.  Musculoskeletal: She exhibits no edema.  Lymphadenopathy:    She has no cervical adenopathy.  Neurological: She is alert and oriented to person, place, and time.  Vitals reviewed.      No results found for this or any previous visit (from the past 2160 hour(s)).   Assessment & Plan  Problem List Items Addressed This Visit      Cardiovascular and Mediastinum   ESSENTIAL HYPERTENSION, MALIGNANT - Primary   Relevant Medications   carvedilol (COREG) 25 MG tablet   Other Relevant Orders   COMPLETE METABOLIC PANEL WITH GFR     Other   HYPERLIPIDEMIA, MIXED   Relevant Medications   carvedilol (COREG) 25 MG tablet   Other Relevant Orders   Lipid Profile   Sinus tachycardia   Relevant Medications   carvedilol (COREG) 25 MG tablet   Other Relevant Orders   CBC with Differential   Acute anxiety      Meds ordered this encounter  Medications  .  carvedilol (COREG) 25 MG tablet    Sig: Take 1 tablet (25 mg total) by mouth 2 (two) times daily with a meal.    Dispense:  180 tablet    Refill:  3   1. Essential hypertension, malignant Cont . Losartran - carvedilol (COREG) 25 MG tablet; Take 1 tablet (25 mg total) by mouth 2 (two) times daily with a meal.  Dispense: 180 tablet; Refill: 3 - COMPLETE METABOLIC PANEL WITH GFR  2. HYPERLIPIDEMIA, MIXED cont meds - Lipid Profile  3. Acute anxiety   4. Sinus tachycardia  - carvedilol (COREG) 25 MG tablet; Take 1 tablet (25 mg total) by mouth 2 (two) times daily with a meal.  Dispense: 180 tablet; Refill: 3 - CBC with Differential

## 2016-05-19 ENCOUNTER — Other Ambulatory Visit: Payer: Self-pay | Admitting: Family Medicine

## 2016-05-19 ENCOUNTER — Other Ambulatory Visit: Payer: BLUE CROSS/BLUE SHIELD

## 2016-05-19 ENCOUNTER — Other Ambulatory Visit: Payer: Self-pay | Admitting: *Deleted

## 2016-05-19 DIAGNOSIS — E785 Hyperlipidemia, unspecified: Secondary | ICD-10-CM

## 2016-05-19 DIAGNOSIS — I10 Essential (primary) hypertension: Secondary | ICD-10-CM

## 2016-05-27 ENCOUNTER — Telehealth: Payer: Self-pay | Admitting: Family Medicine

## 2016-05-27 NOTE — Telephone Encounter (Signed)
Pt wanted Dr.  Luan Pulling to know the carvedilol is not working after the increased dosage.  Her call back number is [phone]

## 2016-05-27 NOTE — Telephone Encounter (Signed)
Patient was seen on 1/16 and carvedilol was increased. Patient reports it is not working. AM blood pressures are 150 over 90s before meds. After taking meds bp levels 120s over 80s. Then by 5-6 pm bp back up again in 150s before taking second dose.  Please advise.

## 2016-05-28 NOTE — Telephone Encounter (Signed)
The BP may continue to improve over ther next 2-3 weeks.  If not totally controlled by then, I would need to see her to make further adjustments.  Be sure that she is taking the Carvedolil twice a day.

## 2016-05-28 NOTE — Telephone Encounter (Signed)
Patient advised.[place] 

## 2016-05-31 ENCOUNTER — Other Ambulatory Visit: Payer: Self-pay | Admitting: Family Medicine

## 2016-05-31 DIAGNOSIS — E782 Mixed hyperlipidemia: Secondary | ICD-10-CM

## 2016-06-09 DIAGNOSIS — M898X9 Other specified disorders of bone, unspecified site: Secondary | ICD-10-CM | POA: Diagnosis not present

## 2016-06-09 DIAGNOSIS — C50812 Malignant neoplasm of overlapping sites of left female breast: Secondary | ICD-10-CM | POA: Diagnosis not present

## 2016-06-09 DIAGNOSIS — T451X5A Adverse effect of antineoplastic and immunosuppressive drugs, initial encounter: Secondary | ICD-10-CM | POA: Diagnosis not present

## 2016-06-24 DIAGNOSIS — E785 Hyperlipidemia, unspecified: Secondary | ICD-10-CM | POA: Diagnosis not present

## 2016-06-24 DIAGNOSIS — R739 Hyperglycemia, unspecified: Secondary | ICD-10-CM | POA: Diagnosis not present

## 2016-06-24 DIAGNOSIS — I10 Essential (primary) hypertension: Secondary | ICD-10-CM | POA: Diagnosis not present

## 2016-06-25 LAB — COMPREHENSIVE METABOLIC PANEL
ALT: 30 IU/L (ref 0–32)
AST: 21 IU/L (ref 0–40)
Albumin/Globulin Ratio: 2.5 — ABNORMAL HIGH (ref 1.2–2.2)
Albumin: 4.9 g/dL (ref 3.5–5.5)
Alkaline Phosphatase: 70 IU/L (ref 39–117)
BUN/Creatinine Ratio: 21 (ref 9–23)
BUN: 12 mg/dL (ref 6–24)
Bilirubin Total: 0.5 mg/dL (ref 0.0–1.2)
CO2: 22 mmol/L (ref 18–29)
Calcium: 10.2 mg/dL (ref 8.7–10.2)
Chloride: 99 mmol/L (ref 96–106)
Creatinine, Ser: 0.56 mg/dL — ABNORMAL LOW (ref 0.57–1.00)
GFR calc Af Amer: 119 mL/min/{1.73_m2} (ref >59)
GFR calc non Af Amer: 103 mL/min/{1.73_m2} (ref >59)
Globulin, Total: 2 g/dL (ref 1.5–4.5)
Glucose: 115 mg/dL — ABNORMAL HIGH (ref 65–99)
Potassium: 4.3 mmol/L (ref 3.5–5.2)
Sodium: 141 mmol/L (ref 134–144)
Total Protein: 6.9 g/dL (ref 6.0–8.5)

## 2016-06-25 LAB — CBC WITH DIFFERENTIAL/PLATELET
Basophils Absolute: 0 10*3/uL (ref 0.0–0.2)
Basos: 1 %
EOS (ABSOLUTE): 0.1 10*3/uL (ref 0.0–0.4)
Eos: 2 %
Hematocrit: 40.8 % (ref 34.0–46.6)
Hemoglobin: 13.6 g/dL (ref 11.1–15.9)
Immature Grans (Abs): 0 10*3/uL (ref 0.0–0.1)
Immature Granulocytes: 0 %
Lymphocytes Absolute: 2.4 10*3/uL (ref 0.7–3.1)
Lymphs: 34 %
MCH: 31.8 pg (ref 26.6–33.0)
MCHC: 33.3 g/dL (ref 31.5–35.7)
MCV: 95 fL (ref 79–97)
Monocytes Absolute: 0.5 10*3/uL (ref 0.1–0.9)
Monocytes: 6 %
Neutrophils Absolute: 4.1 10*3/uL (ref 1.4–7.0)
Neutrophils: 57 %
Platelets: 185 10*3/uL (ref 150–379)
RBC: 4.28 x10E6/uL (ref 3.77–5.28)
RDW: 14.5 % (ref 12.3–15.4)
WBC: 7.1 10*3/uL (ref 3.4–10.8)

## 2016-06-25 LAB — LIPID PANEL
Chol/HDL Ratio: 3.4 ratio units (ref 0.0–4.4)
Cholesterol, Total: 118 mg/dL (ref 100–199)
HDL: 35 mg/dL — ABNORMAL LOW (ref >39)
LDL Calculated: 25 mg/dL (ref 0–99)
Triglycerides: 292 mg/dL — ABNORMAL HIGH (ref 0–149)
VLDL Cholesterol Cal: 58 mg/dL — ABNORMAL HIGH (ref 5–40)

## 2016-07-01 LAB — HGB A1C W/O EAG: Hgb A1c MFr Bld: 6 % — ABNORMAL HIGH (ref 4.8–5.6)

## 2016-07-01 LAB — SPECIMEN STATUS REPORT

## 2016-07-06 ENCOUNTER — Ambulatory Visit: Payer: BLUE CROSS/BLUE SHIELD | Admitting: Family Medicine

## 2016-07-19 ENCOUNTER — Other Ambulatory Visit: Payer: Self-pay | Admitting: Family Medicine

## 2016-07-19 DIAGNOSIS — I10 Essential (primary) hypertension: Secondary | ICD-10-CM

## 2016-09-13 DIAGNOSIS — Z1283 Encounter for screening for malignant neoplasm of skin: Secondary | ICD-10-CM | POA: Diagnosis not present

## 2016-09-13 DIAGNOSIS — L814 Other melanin hyperpigmentation: Secondary | ICD-10-CM | POA: Diagnosis not present

## 2016-09-13 DIAGNOSIS — L720 Epidermal cyst: Secondary | ICD-10-CM | POA: Diagnosis not present

## 2016-09-13 DIAGNOSIS — D1800 Hemangioma unspecified site: Secondary | ICD-10-CM | POA: Diagnosis not present

## 2016-09-13 DIAGNOSIS — L821 Other seborrheic keratosis: Secondary | ICD-10-CM | POA: Diagnosis not present

## 2016-09-15 ENCOUNTER — Other Ambulatory Visit: Payer: Self-pay

## 2016-09-15 DIAGNOSIS — E782 Mixed hyperlipidemia: Secondary | ICD-10-CM

## 2016-09-15 MED ORDER — ROSUVASTATIN CALCIUM 20 MG PO TABS: ORAL_TABLET | ORAL | 3 refills | Status: DC

## 2017-02-03 ENCOUNTER — Ambulatory Visit (INDEPENDENT_AMBULATORY_CARE_PROVIDER_SITE_OTHER): Payer: BLUE CROSS/BLUE SHIELD | Admitting: Nurse Practitioner

## 2017-02-03 ENCOUNTER — Encounter: Payer: Self-pay | Admitting: Nurse Practitioner

## 2017-02-03 ENCOUNTER — Other Ambulatory Visit: Payer: Self-pay | Admitting: Nurse Practitioner

## 2017-02-03 VITALS — BP 129/79 | HR 70 | Temp 98.2°F | Ht 66.0 in | Wt 166.6 lb

## 2017-02-03 DIAGNOSIS — R7303 Prediabetes: Secondary | ICD-10-CM | POA: Diagnosis not present

## 2017-02-03 DIAGNOSIS — Z23 Encounter for immunization: Secondary | ICD-10-CM

## 2017-02-03 DIAGNOSIS — E781 Pure hyperglyceridemia: Secondary | ICD-10-CM

## 2017-02-03 DIAGNOSIS — I10 Essential (primary) hypertension: Secondary | ICD-10-CM | POA: Diagnosis not present

## 2017-02-03 DIAGNOSIS — M810 Age-related osteoporosis without current pathological fracture: Secondary | ICD-10-CM | POA: Insufficient documentation

## 2017-02-03 DIAGNOSIS — M7989 Other specified soft tissue disorders: Secondary | ICD-10-CM

## 2017-02-03 DIAGNOSIS — M818 Other osteoporosis without current pathological fracture: Secondary | ICD-10-CM

## 2017-02-03 DIAGNOSIS — E785 Hyperlipidemia, unspecified: Secondary | ICD-10-CM | POA: Insufficient documentation

## 2017-02-03 DIAGNOSIS — M8589 Other specified disorders of bone density and structure, multiple sites: Secondary | ICD-10-CM

## 2017-02-03 LAB — POCT GLYCOSYLATED HEMOGLOBIN (HGB A1C): Hemoglobin A1C: 6.1

## 2017-02-03 MED ORDER — ICOSAPENT ETHYL 1 G PO CAPS: 2.0000 g | ORAL_CAPSULE | Freq: Two times a day (BID) | ORAL | 3 refills | Status: DC

## 2017-02-03 MED ORDER — METFORMIN HCL 500 MG PO TABS: 500.0000 mg | ORAL_TABLET | Freq: Every day | ORAL | 1 refills | Status: DC

## 2017-02-03 MED ORDER — BLOOD GLUCOSE METER KIT: PACK | 0 refills | Status: DC

## 2017-02-03 NOTE — Progress Notes (Signed)
Pt has non-tender, non-painful, soft tissue mass near right trapezius muscle located right of C6-T1.  Would like it evaluated.

## 2017-02-03 NOTE — Patient Instructions (Addendum)
Lauren Cervantes, Thank you for coming in to clinic today.  1. Your next bone density scan should be scheduled for June 2019.  2. For your blood pressure: you are at goal.   - Continue these medicines without changes.  3. For your triglycerides: - Continue Krill Oil until vascepa.  Take two per day. Then, change to Vascepa 1g tablets. Take 2 tablets twice daily once prior authorization is complete.  You will be due for [place].  This means you should eat no food or drink after midnight.  Drink only water or coffee without cream/sugar on the morning of your lab visit. - Please go ahead and schedule a "Lab Only" visit in the morning for lab draw 3 days before your next office visit. - Your results will be available about 2-3 days after blood draw.  If you have set up a MyChart account, you can can log in to MyChart online to view your results and a brief explanation. Also, we can discuss your results together at your next office visit if you would like.  4. Pre-diabetes: - START metformin 500 mg tablet once daily with breakfast. - Glucose goals are less than 100 in the morning and less than 160 2 hours after a meal.  Please schedule a follow-up appointment with Cassell Smiles, AGNP. Return in about 3 months (around 05/06/2017) for Prediabetes, lipids.  If you have any other questions or concerns, please feel free to call the clinic or send a message through [place]. You may also schedule an earlier appointment if necessary.  You will receive a survey after today's visit either digitally by e-mail or paper by [employer]. Your experiences and feedback matter to [place].  Please respond so we know how we are doing as we provide care for you.   Cassell Smiles, DNP, AGNP-BC Adult Gerontology Nurse Practitioner [place]

## 2017-02-03 NOTE — Assessment & Plan Note (Signed)
Pt w/ osteopenia and on aromatase inhibitor w/ high risk of progression.  Pt taking Boniva and tolerating well.  Plan: 1. Continue Boniva once daily in am early. 2. Repeat DEXA in 8 months after being on Boniva x 1 year.

## 2017-02-03 NOTE — Assessment & Plan Note (Signed)
Stable on last labs.  Has been taking krill oil 1,000 mg once daily, zetia, rosuvastatin.  States zetia significantly improved her triglycerides.   Plan: 1. Would prefer pt take full 4g fish oil daily according to best evidence.  Will prescribe Vascepa 2 g bid.  Copay card provided. 2. Lipid panel recheck in 3 months after being on Vascepa to evaluate efficacy. 3. Follow up 3 months.

## 2017-02-03 NOTE — Assessment & Plan Note (Addendum)
Worsening since last A1c check.  Pt w/ focus on diet w/ weight loss and persistent worsening.  Plan: 1. Encouraged low glycemic diet. 2. Discussed metformin prior to advancement to diabetes.  Pt prefers to initiate metformin now.  Take metformin 500 mg once daily. 3. Encouraged pt to check CBG once daily in am fasting, 2 hrs after meals.  Provided goals. 4. Follow up w/ A1c in 3 months.

## 2017-02-03 NOTE — Progress Notes (Signed)
Subjective:    Patient ID: Lauren Cervantes, female    DOB: 11-Mar-1956, 61 y.o.   MRN: [id number]  Lauren Cervantes is a 61 y.o. female presenting on 02/03/2017 for Hypertension   HPI Hypertension - She is not checking BP at home or outside of clinic.    - Current medications: carvedilol 25 mg bid, losartan 50 mg once daily. tolerating well without side effects - She is not currently symptomatic. - Pt denies headache, lightheadedness, dizziness, changes in vision, chest tightness/pressure, palpitations, leg swelling, sudden loss of speech or loss of consciousness. - She  reports no regular exercise routine. - Her diet is moderate in salt, moderate in fat, and moderate in carbohydrates.  Hyperlipidemia Pt is having occasional myalgias on Crestor, but is willing to tolerate them.  When they do occur, pt will stop Crestor for 2-3 days and resume.  Noticed were worse when taking in am, but has not had as much difficulty since dosing med at night.  Is paying attention to her diet.  Has lost 5 lbs since last visit.  Burning/Hot Pain Left anterior lower leg pain described as a warm sensation that lasts a couple seconds then disappears.  Pain started Sunday, then pt noticed a lump and bruise on upper shin.  Doesn't recall hitting it on anything, but states she may have.  She does not have any other bruises and does not note regular easy bruising.  Notes pain is not as intense as her bruise is improving.  Smoking Cessation Would like to stop smoking and plans to completely stop smoking in January after her busy work season.  She knows added stress will make cessation unsuccessful.  Still using nicotine patch as ordered, but is only smoking 1/3 -1/2 cigarette of 3 cigarettes per day.  Morning is most challenging to quit.  Is sometimes able to reduce to only 2 cigarettes in evening, but does not smoke any cigarettes during the day.    Osteopenia Pt is breast ca survivor w/ completion of  treatment in 2010.  She is on aromatase inhibitors and has had complete hysterectomy.  Opted to initiate bone loss preventive therapy in 2016 and she started alendronate, but could not tolerate it.  Then was started on Boniva x 4 months.  Needs repeat DEXA in June 2019. Denies any history of falls or fractures in last 6-12 months.  Social History  Substance Use Topics  . Smoking status: Current Every Day Smoker    Packs/day: 0.10    Years: 30.00    Types: Cigarettes, E-cigarettes    Last attempt to quit: 05/04/2011  . Smokeless tobacco: Never Used     Comment: Patient using the electronic cigarette  . Alcohol use 1.1 oz/week    1 Glasses of wine, 1 Standard drinks or equivalent per week     Comment: 1 a day most days    Review of Systems Per HPI unless specifically indicated above     Objective:    BP 129/79 (BP Location: Right Arm, Patient Position: Sitting, Cuff Size: Normal)   Pulse 70   Temp 98.2 F (36.8 C) (Oral)   Ht _0  (1.676 m)   Wt 166 lb 9.6 oz (75.6 kg)   BMI 26.89 kg/m   Wt Readings from Last 3 Encounters:  02/03/17 166 lb 9.6 oz (75.6 kg)  05/11/16 166 lb (75.3 kg)  03/29/16 165 lb (74.8 kg)    Physical Exam  General - overweight, well-appearing, NAD HEENT -  Normocephalic, atraumatic Neck - supple, non-tender, no LAD, no thyromegaly, no carotid bruit Heart - RRR, no murmurs heard Lungs - Clear throughout all lobes, no wheezing, crackles, or rhonchi. Normal work of breathing. Extremeties - non-tender, no edema, cap refill < 2 seconds, peripheral pulses intact +2 bilaterally, bruise noted on left anterior lateral shin just below knee.  Small raised, firm area of edema Skin - warm, dry Neuro - awake, alert, oriented x3, normal gait Psych - Normal mood and affect, normal behavior   Results for orders placed or performed in visit on 02/03/17  POCT glycosylated hemoglobin (Hb A1C)  Result Value Ref Range   Hemoglobin A1C 6.1       Assessment & Plan:    Problem List Items Addressed This Visit      Cardiovascular and Mediastinum   ESSENTIAL HYPERTENSION, MALIGNANT    Controlled and stable.  Pt taking medications as prescribed and is tolerating well.  Has no current symptoms.  Plan: 1. Continue carvedilol 25 mg bid - Continue losartan 50 mg once daily 2. Check labs in 3 months w/ next followup per pt request since is difficult stick 3. Followup 3 months.      Relevant Medications   Icosapent Ethyl (VASCEPA) 1 g CAPS     Musculoskeletal and Integument   Osteopenia of multiple sites    Pt w/ osteopenia and on aromatase inhibitor w/ high risk of progression.  Pt taking Boniva and tolerating well.  Plan: 1. Continue Boniva once daily in am early. 2. Repeat DEXA in 8 months after being on Boniva x 1 year.      Relevant Orders   DG Bone Density     Other   Hypertriglyceridemia    Stable on last labs.  Has been taking krill oil 1,000 mg once daily, zetia, rosuvastatin.  States zetia significantly improved her triglycerides.   Plan: 1. Would prefer pt take full 4g fish oil daily according to best evidence.  Will prescribe Vascepa 2 g bid.  Copay card provided. 2. Lipid panel recheck in 3 months after being on Vascepa to evaluate efficacy. 3. Follow up 3 months.      Relevant Medications   Icosapent Ethyl (VASCEPA) 1 g CAPS   Other Relevant Orders   Lipid panel   Comprehensive metabolic panel   TSH   Pre-diabetes - Primary    Worsening since last A1c check.  Pt w/ focus on diet w/ weight loss and persistent worsening.  Plan: 1. Encouraged low glycemic diet. 2. Discussed metformin prior to advancement to diabetes.  Pt prefers to initiate metformin now.  Take metformin 500 mg once daily. 3. Encouraged pt to check CBG once daily in am fasting, 2 hrs after meals.  Provided goals. 4. Follow up w/ A1c in 3 months.      Relevant Medications   metFORMIN (GLUCOPHAGE) 500 MG tablet   blood glucose meter kit and supplies    Other Relevant Orders   POCT glycosylated hemoglobin (Hb A1C) (Completed)   Comprehensive metabolic panel    Other Visit Diagnoses    Needs flu shot       Pt < 10 years old.  Needs quadrivalent flu vaccine administered today.   Relevant Orders   Flu Vaccine QUAD 6+ mos PF IM (Fluarix Quad PF) (Completed)      Meds ordered this encounter  Medications  . ibandronate (BONIVA) 150 MG tablet    Refill:  6  . Icosapent Ethyl (VASCEPA) 1 g CAPS  Sig: Take 2 g by mouth 2 (two) times daily.    Dispense:  360 capsule    Refill:  3    Order Specific Question:   Supervising Provider    Answer:   Olin Hauser [[id number]]  . metFORMIN (GLUCOPHAGE) 500 MG tablet    Sig: Take 1 tablet (500 mg total) by mouth daily with breakfast.    Dispense:  90 tablet    Refill:  1    Order Specific Question:   Supervising Provider    Answer:   Olin Hauser [[id number]]  . blood glucose meter kit and supplies    Sig: Dispense based on patient and insurance preference. Use once daily as directed. (FOR ICD-9 250.00, 250.01).    Dispense:  1 each    Refill:  0    Order Specific Question:   Supervising Provider    Answer:   Olin Hauser [[id number]]    Order Specific Question:   Number of strips    Answer:   100    Order Specific Question:   Number of lancets    Answer:   100      Follow up plan: Return in about 3 months (around 05/06/2017) for Prediabetes, lipids.  A total of 40 minutes was spent face-to-face with this patient. Greater than 50% of this time was spent in counseling and coordination of care with the patient regarding diagnoses, treatments in A/P above.  Cassell Smiles, DNP, AGPCNP-BC Adult Gerontology Primary Care Nurse Practitioner [facility] 02/03/2017, 6:45 PM

## 2017-02-03 NOTE — Assessment & Plan Note (Signed)
Controlled and stable.  Pt taking medications as prescribed and is tolerating well.  Has no current symptoms.  Plan: 1. Continue carvedilol 25 mg bid - Continue losartan 50 mg once daily 2. Check labs in 3 months w/ next followup per pt request since is difficult stick 3. Followup 3 months.

## 2017-02-08 ENCOUNTER — Other Ambulatory Visit: Payer: Self-pay

## 2017-02-10 ENCOUNTER — Ambulatory Visit
Admission: RE | Admit: 2017-02-10 | Discharge: 2017-02-10 | Disposition: A | Discharge status: Home / Self Care | Payer: BLUE CROSS/BLUE SHIELD | Source: Ambulatory Visit | Attending: Nurse Practitioner | Admitting: Nurse Practitioner

## 2017-02-10 DIAGNOSIS — R221 Localized swelling, mass and lump, neck: Secondary | ICD-10-CM | POA: Diagnosis not present

## 2017-02-10 DIAGNOSIS — M799 Soft tissue disorder, unspecified: Secondary | ICD-10-CM | POA: Diagnosis not present

## 2017-02-10 DIAGNOSIS — M7989 Other specified soft tissue disorders: Secondary | ICD-10-CM

## 2017-05-04 DIAGNOSIS — Z170 Estrogen receptor positive status [ER+]: Secondary | ICD-10-CM | POA: Diagnosis not present

## 2017-05-04 DIAGNOSIS — Z1231 Encounter for screening mammogram for malignant neoplasm of breast: Secondary | ICD-10-CM | POA: Diagnosis not present

## 2017-05-04 DIAGNOSIS — C50812 Malignant neoplasm of overlapping sites of left female breast: Secondary | ICD-10-CM | POA: Diagnosis not present

## 2017-05-06 ENCOUNTER — Ambulatory Visit: Payer: BLUE CROSS/BLUE SHIELD | Admitting: Nurse Practitioner

## 2017-05-06 ENCOUNTER — Other Ambulatory Visit: Payer: Self-pay | Admitting: Nurse Practitioner

## 2017-05-06 ENCOUNTER — Encounter: Payer: Self-pay | Admitting: Nurse Practitioner

## 2017-05-06 ENCOUNTER — Other Ambulatory Visit: Payer: Self-pay

## 2017-05-06 VITALS — BP 115/74 | HR 67 | Ht 66.0 in | Wt 166.4 lb

## 2017-05-06 DIAGNOSIS — Z79899 Other long term (current) drug therapy: Secondary | ICD-10-CM | POA: Diagnosis not present

## 2017-05-06 DIAGNOSIS — R7303 Prediabetes: Secondary | ICD-10-CM

## 2017-05-06 DIAGNOSIS — E781 Pure hyperglyceridemia: Secondary | ICD-10-CM

## 2017-05-06 DIAGNOSIS — Z853 Personal history of malignant neoplasm of breast: Secondary | ICD-10-CM

## 2017-05-06 DIAGNOSIS — Z114 Encounter for screening for human immunodeficiency virus [HIV]: Secondary | ICD-10-CM

## 2017-05-06 DIAGNOSIS — Z1159 Encounter for screening for other viral diseases: Secondary | ICD-10-CM

## 2017-05-06 LAB — POCT GLYCOSYLATED HEMOGLOBIN (HGB A1C): Hemoglobin A1C: 6

## 2017-05-06 MED ORDER — FISH OIL 1000 MG PO CAPS: 1000.0000 mg | ORAL_CAPSULE | Freq: Two times a day (BID) | ORAL | 0 refills | Status: DC

## 2017-05-06 NOTE — Patient Instructions (Addendum)
Cayleen, Thank you for coming in to clinic today.  1. Depending on lipid panel results, we may increase fish oil even more for triglycerides.   2. Continue working on Pharmacist, community with low glycemic diet and low saturated fats.   3. You will be due for FASTING BLOOD WORK.  This means you should eat no food or drink after midnight.  Drink only water or coffee without cream/sugar on the morning of your lab visit. - Please go ahead and obtain your labs at [facility] in the next 7 days. - Your results will be available about 2-3 days after blood draw.  If you have set up a MyChart account, you can can log in to MyChart online to view your results and a brief explanation. Also, we can discuss your results together at your next office visit if you would like.   Please schedule a follow-up appointment with Cassell Smiles, AGNP. Return in about 1 year (around 05/06/2018) for Hypertension and Prediabetes.  If you have any other questions or concerns, please feel free to call the clinic or send a message through [place]. You may also schedule an earlier appointment if necessary.  You will receive a survey after today's visit either digitally by e-mail or paper by [employer]. Your experiences and feedback matter to [place].  Please respond so we know how we are doing as we provide care for you.   Cassell Smiles, DNP, AGNP-BC Adult Gerontology Nurse Practitioner [place]

## 2017-05-06 NOTE — Progress Notes (Signed)
Subjective:    Patient ID: Lauren Cervantes, female    DOB: 05-Dec-1955, 62 y.o.   MRN: [id number]  Lauren Cervantes is a 62 y.o. female presenting on 05/06/2017 for Blood Sugar Problem   HPI PreDiabetes - CBG 90--140, but mostly 120-130 at home Has cut back carbs, increasing exercise 30 minutes daily of yoga, walking, bike.  - Current diabetic medications include: metformin - She is not currently symptomatic.  - She denies polydipsia, polyphagia, polyuria, headaches, diaphoresis, shakiness, chills, pain, numbness or tingling in extremities and changes in vision.    Recent Labs    06/24/16 0851 02/03/17 0843 05/06/17 0819  HGBA1C 6.0* 6.1 6.0    Wed has scheduled Mammo at [facility] for Breast Ca followup.  Hypertriglyceridemia Pt has had hypertriglyceridemia with improvement on Zetia and fish oil 1 capsule bid.  Pt reports no new difficulties with medications or changes in symptoms.  No s/sx of pancreatitis. Pt denies changes in vision, chest tightness/pressure, palpitations, shortness of breath, leg pain while walking, leg or arm weakness, and sudden loss of speech or loss of consciousness.   Smoking Cessation Still using nicotine patches during day 4 days per week, 2 cig per day.  Has not been able to completely quit the cigarettes in the morning and evening.  Social History   Tobacco Use  . Smoking status: Current Every Day Smoker    Packs/day: 0.05    Years: 30.00    Pack years: 1.50    Types: Cigarettes, E-cigarettes    Last attempt to quit: 05/04/2011    Years since quitting: 6.0  . Smokeless tobacco: Never Used  . Tobacco comment: Patient using the electronic cigarette  Substance Use Topics  . Alcohol use: Yes    Alcohol/week: 1.1 oz    Types: 1 Glasses of wine, 1 Standard drinks or equivalent per week    Comment: 1 a day most days  . Drug use: No    Review of Systems Per HPI unless specifically indicated above     Objective:    BP 115/74 (BP Location:  Right Arm, Patient Position: Sitting, Cuff Size: Normal)   Pulse 67   Ht 5\' 6"  (1.676 m)   Wt 166 lb 6.4 oz (75.5 kg)   BMI 26.86 kg/m   Wt Readings from Last 3 Encounters:  05/06/17 166 lb 6.4 oz (75.5 kg)  02/03/17 166 lb 9.6 oz (75.6 kg)  05/11/16 166 lb (75.3 kg)    Physical Exam  General - overweight, well-appearing, NAD HEENT - Normocephalic, atraumatic Neck - supple, non-tender, no LAD, no thyromegaly, no carotid bruit Heart - RRR, no murmurs heard Lungs - Clear throughout all lobes, no wheezing, crackles, or rhonchi. Normal work of breathing. Extremeties - non-tender, no edema, cap refill < 2 seconds, peripheral pulses intact +2 bilaterally Skin - warm, dry Neuro - awake, alert, oriented x3, normal gait Psych - Normal mood and affect, normal behavior     Results for orders placed or performed in visit on 05/06/17  HIV antibody  Result Value Ref Range   HIV Screen 4th Generation wRfx Non Reactive Non Reactive  Hepatitis C Antibody  Result Value Ref Range   Hep C Virus Ab <0.1 0.0 - 0.9 s/Cervantes ratio  Comprehensive metabolic panel  Result Value Ref Range   Glucose 118 (H) 65 - 99 mg/dL   BUN 15 6 - 24 mg/dL   Creatinine, Ser 0.62 0.57 - 1.00 mg/dL   GFR calc non Af Wyvonnia Lora  99 >59 mL/min/1.73   GFR calc Af Amer 114 >59 mL/min/1.73   BUN/Creatinine Ratio 24 (H) 9 - 23   Sodium 143 134 - 144 mmol/L   Potassium 4.5 3.5 - 5.2 mmol/L   Chloride 103 96 - 106 mmol/L   CO2 20 20 - 29 mmol/L   Calcium 9.4 8.7 - 10.2 mg/dL   Total Protein 6.7 6.0 - 8.5 g/dL   Albumin 4.6 3.5 - 5.5 g/dL   Globulin, Total 2.1 1.5 - 4.5 g/dL   Albumin/Globulin Ratio 2.2 1.2 - 2.2   Bilirubin Total 0.4 0.0 - 1.2 mg/dL   Alkaline Phosphatase 61 39 - 117 IU/L   AST 20 0 - 40 IU/L   ALT 28 0 - 32 IU/L  Lipid panel  Result Value Ref Range   Cholesterol, Total 125 100 - 199 mg/dL   Triglycerides 242 (H) 0 - 149 mg/dL   HDL 35 (L) >39 mg/dL   VLDL Cholesterol Cal 48 (H) 5 - 40 mg/dL   LDL  Calculated 42 0 - 99 mg/dL   Chol/HDL Ratio 3.6 0.0 - 4.4 ratio  POCT glycosylated hemoglobin (Hb A1C)  Result Value Ref Range   Hemoglobin A1C 6.0       Assessment & Plan:   Problem List Items Addressed This Visit      Other   History of left breast cancer    Pt to obtain mammogram this week for her breast Ca surveillance.  She is followed at [facility].      Hypertriglyceridemia    Stable on last labs.  Has been taking krill oil 1,000 mg once daily, zetia, rosuvastatin.  States zetia significantly improved her triglycerides.   Plan: 1. Would prefer pt take full 4g fish oil daily according to best evidence.  Will prescribe Vascepa 2 g bid. - Pt was unable to afford, so has been taking 1 g bid OTC.    Increase to 2 g bid if no increase in LDL on labs. 2. Lipid panel recheck today. 3. Follow up 1 year.      Relevant Medications   Omega-3 Fatty Acids (FISH OIL) 1000 MG CAPS   Other Relevant Orders   Comprehensive metabolic panel (Completed)   Lipid panel (Completed)   Pre-diabetes - Primary    Improved since last A1c check.  Pt w/ focus on diet w/ weight loss.  Plan: 1. Encouraged low glycemic diet. 2. Continue metformin 500 mg once daily. 3. Encouraged pt to check CBG once daily in am fasting, 2 hrs after meals occasionally.  Provided goals.  4. Follow up w/ A1c in 6 months-1 year.      Relevant Orders   POCT glycosylated hemoglobin (Hb A1C) (Completed)   Comprehensive metabolic panel (Completed)   Lipid panel (Completed)    Other Visit Diagnoses    Long-term use of high-risk medication       Relevant Medications   Omega-3 Fatty Acids (FISH OIL) 1000 MG CAPS   Other Relevant Orders   Comprehensive metabolic panel (Completed)   Lipid panel (Completed)   Need for hepatitis C screening test       Relevant Orders   Hepatitis C Antibody (Completed)   Encounter for screening for HIV       Relevant Orders   HIV antibody (Completed)      Meds ordered this  encounter  Medications  . Omega-3 Fatty Acids (FISH OIL) 1000 MG CAPS    Sig: Take 1 capsule (1,000 mg  total) by mouth 2 (two) times daily.    Refill:  0    Order Specific Question:   Supervising Provider    Answer:   Olin Hauser [[id number]]      Follow up plan: Return in about 1 year (around 05/06/2018) for Hypertension and Prediabetes.   Cassell Smiles, DNP, AGPCNP-BC Adult Gerontology Primary Care Nurse Practitioner [facility] 05/26/2017, 6:22 AM

## 2017-05-12 ENCOUNTER — Other Ambulatory Visit: Payer: Self-pay | Admitting: Nurse Practitioner

## 2017-05-12 DIAGNOSIS — E781 Pure hyperglyceridemia: Secondary | ICD-10-CM | POA: Diagnosis not present

## 2017-05-12 DIAGNOSIS — Z79899 Other long term (current) drug therapy: Secondary | ICD-10-CM | POA: Diagnosis not present

## 2017-05-12 DIAGNOSIS — Z114 Encounter for screening for human immunodeficiency virus [HIV]: Secondary | ICD-10-CM | POA: Diagnosis not present

## 2017-05-12 DIAGNOSIS — R7303 Prediabetes: Secondary | ICD-10-CM | POA: Diagnosis not present

## 2017-05-12 DIAGNOSIS — Z1159 Encounter for screening for other viral diseases: Secondary | ICD-10-CM | POA: Diagnosis not present

## 2017-05-13 LAB — LIPID PANEL
Chol/HDL Ratio: 3.6 ratio (ref 0.0–4.4)
Cholesterol, Total: 125 mg/dL (ref 100–199)
HDL: 35 mg/dL — ABNORMAL LOW (ref >39)
LDL Calculated: 42 mg/dL (ref 0–99)
Triglycerides: 242 mg/dL — ABNORMAL HIGH (ref 0–149)
VLDL Cholesterol Cal: 48 mg/dL — ABNORMAL HIGH (ref 5–40)

## 2017-05-13 LAB — COMPREHENSIVE METABOLIC PANEL
ALT: 28 IU/L (ref 0–32)
AST: 20 IU/L (ref 0–40)
Albumin/Globulin Ratio: 2.2 (ref 1.2–2.2)
Albumin: 4.6 g/dL (ref 3.5–5.5)
Alkaline Phosphatase: 61 IU/L (ref 39–117)
BUN/Creatinine Ratio: 24 — ABNORMAL HIGH (ref 9–23)
BUN: 15 mg/dL (ref 6–24)
Bilirubin Total: 0.4 mg/dL (ref 0.0–1.2)
CO2: 20 mmol/L (ref 20–29)
Calcium: 9.4 mg/dL (ref 8.7–10.2)
Chloride: 103 mmol/L (ref 96–106)
Creatinine, Ser: 0.62 mg/dL (ref 0.57–1.00)
GFR calc Af Amer: 114 mL/min/{1.73_m2} (ref >59)
GFR calc non Af Amer: 99 mL/min/{1.73_m2} (ref >59)
Globulin, Total: 2.1 g/dL (ref 1.5–4.5)
Glucose: 118 mg/dL — ABNORMAL HIGH (ref 65–99)
Potassium: 4.5 mmol/L (ref 3.5–5.2)
Sodium: 143 mmol/L (ref 134–144)
Total Protein: 6.7 g/dL (ref 6.0–8.5)

## 2017-05-13 LAB — HIV ANTIBODY (ROUTINE TESTING W REFLEX): HIV Screen 4th Generation wRfx: NONREACTIVE

## 2017-05-13 LAB — HEPATITIS C ANTIBODY: Hep C Virus Ab: <0.1 s/co ratio (ref 0.0–0.9)

## 2017-05-17 ENCOUNTER — Encounter: Payer: Self-pay | Admitting: Nurse Practitioner

## 2017-05-26 ENCOUNTER — Encounter: Payer: Self-pay | Admitting: Nurse Practitioner

## 2017-05-26 NOTE — Assessment & Plan Note (Signed)
Stable on last labs.  Has been taking krill oil 1,000 mg once daily, zetia, rosuvastatin.  States zetia significantly improved her triglycerides.   Plan: 1. Would prefer pt take full 4g fish oil daily according to best evidence.  Will prescribe Vascepa 2 g bid. - Pt was unable to afford, so has been taking 1 g bid OTC.    Increase to 2 g bid if no increase in LDL on labs. 2. Lipid panel recheck today. 3. Follow up 1 year.

## 2017-05-26 NOTE — Assessment & Plan Note (Signed)
Pt to obtain mammogram this week for her breast Ca surveillance.  She is followed at [facility].

## 2017-05-26 NOTE — Assessment & Plan Note (Signed)
Improved since last A1c check.  Pt w/ focus on diet w/ weight loss.  Plan: 1. Encouraged low glycemic diet. 2. Continue metformin 500 mg once daily. 3. Encouraged pt to check CBG once daily in am fasting, 2 hrs after meals occasionally.  Provided goals.  4. Follow up w/ A1c in 6 months-1 year.

## 2017-05-30 ENCOUNTER — Other Ambulatory Visit: Payer: Self-pay | Admitting: Nurse Practitioner

## 2017-05-30 DIAGNOSIS — E782 Mixed hyperlipidemia: Secondary | ICD-10-CM

## 2017-05-31 ENCOUNTER — Telehealth: Payer: Self-pay | Admitting: Family Medicine

## 2017-05-31 NOTE — Telephone Encounter (Signed)
Pt needs refill on zetia 90 day supply sent to [facility].  Her call back number is  [phone]

## 2017-06-02 NOTE — Telephone Encounter (Signed)
Rx send and pt was informed.

## 2017-06-28 ENCOUNTER — Telehealth: Payer: Self-pay | Admitting: Nurse Practitioner

## 2017-06-28 NOTE — Telephone Encounter (Signed)
Pt. Called requesting refill on  Carvedilol 90 supply called into  Lauren Cervantes pt call back # is [phone]

## 2017-06-29 ENCOUNTER — Other Ambulatory Visit: Payer: Self-pay | Admitting: Family Medicine

## 2017-06-29 DIAGNOSIS — R000 Tachycardia, unspecified: Secondary | ICD-10-CM

## 2017-06-29 DIAGNOSIS — I10 Essential (primary) hypertension: Secondary | ICD-10-CM

## 2017-06-29 MED ORDER — CARVEDILOL 25 MG PO TABS: 25.0000 mg | ORAL_TABLET | Freq: Two times a day (BID) | ORAL | 0 refills | Status: DC

## 2017-06-29 NOTE — Telephone Encounter (Signed)
Rx send

## 2017-07-14 ENCOUNTER — Other Ambulatory Visit: Payer: Self-pay | Admitting: Family Medicine

## 2017-07-14 DIAGNOSIS — I10 Essential (primary) hypertension: Secondary | ICD-10-CM

## 2017-07-14 NOTE — Telephone Encounter (Signed)
Pt needs refill on losartan sent to [facility].  Her call back number is [phone]

## 2017-07-15 MED ORDER — LOSARTAN POTASSIUM 50 MG PO TABS: 50.0000 mg | ORAL_TABLET | Freq: Every day | ORAL | 3 refills | Status: DC

## 2017-09-21 ENCOUNTER — Other Ambulatory Visit: Payer: Self-pay | Admitting: Family Medicine

## 2017-09-21 DIAGNOSIS — E782 Mixed hyperlipidemia: Secondary | ICD-10-CM

## 2017-09-21 DIAGNOSIS — R000 Tachycardia, unspecified: Secondary | ICD-10-CM

## 2017-09-21 DIAGNOSIS — I10 Essential (primary) hypertension: Secondary | ICD-10-CM

## 2017-09-27 ENCOUNTER — Other Ambulatory Visit: Payer: Self-pay | Admitting: Nurse Practitioner

## 2017-09-27 DIAGNOSIS — R000 Tachycardia, unspecified: Secondary | ICD-10-CM

## 2017-09-27 DIAGNOSIS — I10 Essential (primary) hypertension: Secondary | ICD-10-CM

## 2017-09-27 DIAGNOSIS — E782 Mixed hyperlipidemia: Secondary | ICD-10-CM

## 2017-09-27 MED ORDER — ROSUVASTATIN CALCIUM 20 MG PO TABS: ORAL_TABLET | ORAL | 3 refills | Status: DC

## 2017-09-27 MED ORDER — CARVEDILOL 25 MG PO TABS: 25.0000 mg | ORAL_TABLET | Freq: Two times a day (BID) | ORAL | 0 refills | Status: DC

## 2017-09-27 NOTE — Telephone Encounter (Signed)
Pt. Called requesting a refill for rosuvastatin 20 mg, carvedilol 25 mg  Called into  [facility]

## 2017-12-22 ENCOUNTER — Other Ambulatory Visit: Payer: Self-pay | Admitting: Nurse Practitioner

## 2017-12-22 DIAGNOSIS — R000 Tachycardia, unspecified: Secondary | ICD-10-CM

## 2017-12-22 DIAGNOSIS — I10 Essential (primary) hypertension: Secondary | ICD-10-CM

## 2018-01-24 ENCOUNTER — Other Ambulatory Visit: Payer: Self-pay | Admitting: Nurse Practitioner

## 2018-01-24 DIAGNOSIS — I10 Essential (primary) hypertension: Secondary | ICD-10-CM

## 2018-01-24 DIAGNOSIS — R000 Tachycardia, unspecified: Secondary | ICD-10-CM

## 2018-01-27 DIAGNOSIS — Z23 Encounter for immunization: Secondary | ICD-10-CM | POA: Diagnosis not present

## 2018-02-27 ENCOUNTER — Other Ambulatory Visit: Payer: Self-pay | Admitting: Nurse Practitioner

## 2018-02-27 DIAGNOSIS — I10 Essential (primary) hypertension: Secondary | ICD-10-CM

## 2018-02-27 DIAGNOSIS — R000 Tachycardia, unspecified: Secondary | ICD-10-CM

## 2018-04-02 ENCOUNTER — Other Ambulatory Visit: Payer: Self-pay | Admitting: Nurse Practitioner

## 2018-04-02 DIAGNOSIS — I10 Essential (primary) hypertension: Secondary | ICD-10-CM

## 2018-04-02 DIAGNOSIS — R000 Tachycardia, unspecified: Secondary | ICD-10-CM

## 2018-05-10 DIAGNOSIS — N6489 Other specified disorders of breast: Secondary | ICD-10-CM | POA: Diagnosis not present

## 2018-05-10 DIAGNOSIS — Z1231 Encounter for screening mammogram for malignant neoplasm of breast: Secondary | ICD-10-CM | POA: Diagnosis not present

## 2018-05-10 DIAGNOSIS — Z853 Personal history of malignant neoplasm of breast: Secondary | ICD-10-CM | POA: Diagnosis not present

## 2018-05-12 DIAGNOSIS — R7303 Prediabetes: Secondary | ICD-10-CM | POA: Diagnosis not present

## 2018-05-12 DIAGNOSIS — E781 Pure hyperglyceridemia: Secondary | ICD-10-CM | POA: Diagnosis not present

## 2018-05-12 DIAGNOSIS — Z114 Encounter for screening for human immunodeficiency virus [HIV]: Secondary | ICD-10-CM | POA: Diagnosis not present

## 2018-05-12 DIAGNOSIS — Z1159 Encounter for screening for other viral diseases: Secondary | ICD-10-CM | POA: Diagnosis not present

## 2018-05-12 DIAGNOSIS — Z79899 Other long term (current) drug therapy: Secondary | ICD-10-CM | POA: Diagnosis not present

## 2018-05-26 ENCOUNTER — Telehealth: Payer: Self-pay | Admitting: Nurse Practitioner

## 2018-05-26 ENCOUNTER — Other Ambulatory Visit: Payer: Self-pay | Admitting: Nurse Practitioner

## 2018-05-26 ENCOUNTER — Other Ambulatory Visit: Payer: Self-pay

## 2018-05-26 DIAGNOSIS — E782 Mixed hyperlipidemia: Secondary | ICD-10-CM

## 2018-05-26 MED ORDER — EZETIMIBE 10 MG PO TABS: 10.0000 mg | ORAL_TABLET | Freq: Every day | ORAL | 1 refills | Status: DC

## 2018-05-26 NOTE — Telephone Encounter (Signed)
Pt needs a refill on ezetimibe.  Please call [phone]

## 2018-05-26 NOTE — Telephone Encounter (Signed)
Patient has not been seen in 1 year.  Please call her back to make appointment.

## 2018-06-01 ENCOUNTER — Encounter: Payer: Self-pay | Admitting: Nurse Practitioner

## 2018-06-01 ENCOUNTER — Ambulatory Visit: Payer: BLUE CROSS/BLUE SHIELD | Admitting: Nurse Practitioner

## 2018-06-01 ENCOUNTER — Other Ambulatory Visit: Payer: Self-pay

## 2018-06-01 VITALS — BP 131/68 | HR 71 | Temp 98.6°F | Ht 66.0 in | Wt 170.8 lb

## 2018-06-01 DIAGNOSIS — M85852 Other specified disorders of bone density and structure, left thigh: Secondary | ICD-10-CM | POA: Diagnosis not present

## 2018-06-01 DIAGNOSIS — E781 Pure hyperglyceridemia: Secondary | ICD-10-CM

## 2018-06-01 DIAGNOSIS — R7303 Prediabetes: Secondary | ICD-10-CM

## 2018-06-01 DIAGNOSIS — B001 Herpesviral vesicular dermatitis: Secondary | ICD-10-CM

## 2018-06-01 DIAGNOSIS — Z23 Encounter for immunization: Secondary | ICD-10-CM

## 2018-06-01 DIAGNOSIS — C50812 Malignant neoplasm of overlapping sites of left female breast: Secondary | ICD-10-CM | POA: Insufficient documentation

## 2018-06-01 DIAGNOSIS — M818 Other osteoporosis without current pathological fracture: Secondary | ICD-10-CM | POA: Diagnosis not present

## 2018-06-01 DIAGNOSIS — Z170 Estrogen receptor positive status [ER+]: Secondary | ICD-10-CM | POA: Insufficient documentation

## 2018-06-01 DIAGNOSIS — M810 Age-related osteoporosis without current pathological fracture: Secondary | ICD-10-CM

## 2018-06-01 DIAGNOSIS — I10 Essential (primary) hypertension: Secondary | ICD-10-CM

## 2018-06-01 MED ORDER — VALACYCLOVIR HCL 1 G PO TABS: ORAL_TABLET | ORAL | 1 refills | Status: DC

## 2018-06-01 MED ORDER — ICOSAPENT ETHYL 1 G PO CAPS: 2.0000 g | ORAL_CAPSULE | Freq: Two times a day (BID) | ORAL | 4 refills | Status: DC

## 2018-06-01 NOTE — Progress Notes (Signed)
Subjective:    Patient ID: Lauren Cervantes, female    DOB: [birth year], 63 y.o.   MRN: [id number]  Lauren Cervantes is a 63 y.o. female presenting on 06/01/2018 for Hypertension   HPI Hypertension - She is checking BP at home or outside of clinic.  Readings 130-140/85 at home with automatic wrist cuff that has been calibrated/compared to manual. - Current medications: carvedilol 25 mg bid, losartan 50 mg once daily, tolerating well without side effects - She is not currently symptomatic. - Pt denies headache, lightheadedness, dizziness, changes in vision, chest tightness/pressure, palpitations, leg swelling, sudden loss of speech or loss of consciousness. 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   - Her diet is low in salt, moderate in fat, and moderate in carbohydrates.   Eats lower salt, higher vegetables, smaller portions.    Hyperlipidemia Patient continues ezetimibe, rosuvastatin without side effects.  Would like Vascepa trial again - she found $9 copay card. Has continued on krill oil tabs since last year. - Pt denies changes in vision, chest tightness/pressure, palpitations, shortness of breath, leg pain while walking, leg or arm weakness, and sudden loss of speech or loss of consciousness.  Prediabetes Metformin - not taking - only took for 2 days.  Has continued to check fasting blood sugar.  Patient still has strips for CBG - all under 130 for fasting am blood sugar.  Patient did not want to continue medication for primary management until a possible future disease progression to DM.    Osteoporosis - on boniva (ONCOLOGY rx due to estrogen deficiency with Arimidex) Last DEXA at [facility] Z score -1.9, T score -3.1 Spine  Boniva - Takes first week /10th of every month. - Patient is considering Shingrix today for preventative care. - Mammogram up to date from outside agency -annually - Patient will  return for Tdap in about 6 months.  Social History   Tobacco Use  . Smoking status: Current Every Day Smoker    Packs/day: 0.05    Years: 30.00    Pack years: 1.50    Types: Cigarettes    Last attempt to quit: 05/04/2011    Years since quitting: 7.0  . Smokeless tobacco: Never Used  . Tobacco comment: Patient using the electronic cigarette  Substance Use Topics  . Alcohol use: Yes    Alcohol/week: 2.0 standard drinks    Types: 1 Glasses of wine, 1 Standard drinks or equivalent per week    Comment: 1 a day most days  . Drug use: No    Review of Systems Per HPI unless specifically indicated above     Objective:    BP 131/68 (BP Location: Right Arm, Patient Position: Sitting, Cuff Size: Normal)   Pulse 71   Temp 98.6 F (37 C) (Oral)   Ht 5\' 6"  (1.676 m)   Wt 170 lb 12.8 oz (77.5 kg)   SpO2 98%   BMI 27.57 kg/m   Wt Readings from Last 3 Encounters:  06/01/18 170 lb 12.8 oz (77.5 kg)  05/06/17 166 lb 6.4 oz (75.5 kg)  02/03/17 166 lb 9.6 oz (75.6 kg)    Physical Exam Vitals signs reviewed.  Constitutional:      General: She is awake. She is not in acute distress.    Appearance: She is well-developed.  HENT:     Head: Normocephalic and atraumatic.  Neck:  Musculoskeletal: Normal range of motion and neck supple.     Vascular: No carotid bruit.  Cardiovascular:     Rate and Rhythm: Normal rate and regular rhythm.     Pulses:          Radial pulses are 2+ on the right side and 2+ on the left side.       Posterior tibial pulses are 1+ on the right side and 1+ on the left side.     Heart sounds: Normal heart sounds, S1 normal and S2 normal.  Pulmonary:     Effort: Pulmonary effort is normal. No respiratory distress.     Breath sounds: Normal breath sounds and air entry.  Abdominal:     General: Bowel sounds are normal. There is no distension.     Palpations: Abdomen is soft.     Tenderness: There is no abdominal tenderness.     Hernia: No hernia is present.    Musculoskeletal: Normal range of motion.        General: No tenderness.  Skin:    General: Skin is warm and dry.     Capillary Refill: Capillary refill takes less than 2 seconds.  Neurological:     General: No focal deficit present.     Mental Status: She is alert and oriented to person, place, and time. Mental status is at baseline.     Motor: No weakness.     Gait: Gait normal.  Psychiatric:        Attention and Perception: Attention normal.        Mood and Affect: Mood and affect normal.        Behavior: Behavior normal. Behavior is cooperative.        Thought Content: Thought content normal.        Judgment: Judgment normal.    Results for orders placed or performed in visit on 05/12/17  Comprehensive metabolic panel  Result Value Ref Range   Glucose 84 65 - 99 mg/dL   BUN 10 8 - 27 mg/dL   Creatinine, Ser 0.77 0.57 - 1.00 mg/dL   GFR calc non Af Amer 84 >59 mL/min/1.73   GFR calc Af Amer 97 >59 mL/min/1.73   BUN/Creatinine Ratio 13 12 - 28   Sodium 142 134 - 144 mmol/L   Potassium 4.8 3.5 - 5.2 mmol/L   Chloride 103 96 - 106 mmol/L   CO2 24 20 - 29 mmol/L   Calcium 9.7 8.7 - 10.3 mg/dL   Total Protein 6.9 6.0 - 8.5 g/dL   Albumin 4.4 3.8 - 4.9 g/dL   Globulin, Total 2.5 1.5 - 4.5 g/dL   Albumin/Globulin Ratio 1.8 1.2 - 2.2   Bilirubin Total 0.3 0.0 - 1.2 mg/dL   Alkaline Phosphatase 51 39 - 117 IU/L   AST 15 0 - 40 IU/L   ALT 13 0 - 32 IU/L  Lipid panel  Result Value Ref Range   Cholesterol, Total 110 100 - 199 mg/dL   Triglycerides 30 0 - 149 mg/dL   HDL 57 >39 mg/dL   VLDL Cholesterol Cal 6 5 - 40 mg/dL   LDL Calculated 47 0 - 99 mg/dL   Chol/HDL Ratio 1.9 0.0 - 4.4 ratio  HIV Antibody (routine testing w rflx)  Result Value Ref Range   HIV Screen 4th Generation wRfx Non Reactive Non Reactive  Hepatitis C antibody  Result Value Ref Range   Hep C Virus Ab <0.1 0.0 - 0.9 s/Cervantes ratio  Assessment & Plan:   Problem List Items Addressed This Visit       Cardiovascular and Mediastinum   ESSENTIAL HYPERTENSION, MALIGNANT - Primary Controlled hypertension.  BP goal < 130/80.  Pt is working on lifestyle modifications.  Taking medications tolerating well without side effects. No currently identified complications.  Plan: 1. Continue taking losartan, carvedilol 2. Obtain labs when able in next 4 weeks  3. Encouraged heart healthy diet and increasing exercise to 30 minutes most days of the week. 4. Check BP 1-2 x per week at home, keep log, and bring to clinic at next appointment. 5. Follow up 6 months.     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  Medications tolerated without side effects.  Continue as needed during cold sore outbreak.  Refills provided.  Followup 1 year.    Relevant Medications   valACYclovir (VALTREX) 1000 MG tablet     Musculoskeletal and Integument   Osteoporosis of lumbar spine Status unknown. No falls in last 1 year.  No balance problems.    Plan: 1. Recheck DEXA - order placed - will need to go to [facility] center per patient request and last location of DEXA.   2. Continue meds without changes today.  Refills provided.  3. Followup prn after labs and in 1 year.    Relevant Orders   DG Bone Density   Osteopenia of neck of left femur   Relevant Orders   DG Bone Density     Other   Hyperlipidemia Stable today on exam.  Medications tolerated without side effects.  Continue at current doses.  Refills provided.  Check labs today. Followup 6 months.    Relevant Medications   aspirin EC 81 MG tablet   Icosapent Ethyl (VASCEPA) 1 g CAPS   Pre-diabetes Stable today on exam.  Medications stopped due to not wishing to take at this time.  Reasonable given no T2DM diagnosis..  Continue healthy lifestyle modifications.   .  Check labs today. Followup 6  months.    Relevant Orders   Hemoglobin A1c   Comprehensive metabolic panel    Other Visit Diagnoses    Need for diphtheria-tetanus-pertussis (Tdap) vaccine       Need for shingles vaccine     Patient will receive Shingrix today, Tdap in about 4-6 weeks in clinic.  Follow-up prn and in 2-6 months for second Shingrix dose.   Relevant Orders   Varicella-zoster vaccine IM (Completed)      Meds ordered this encounter  Medications  . valACYclovir (VALTREX) 1000 MG tablet    Sig: Take 2 tablets (2,000 mg) once at start of a cold sore.  Take 1 tablet (1,000 mg) daily until fever bilster is gone or for total of 5 days.    Dispense:  20 tablet    Refill:  1    Order Specific Question:   Supervising Provider    Answer:   Olin Hauser [[id number]]  . Icosapent Ethyl (VASCEPA) 1 g CAPS    Sig: Take 2 capsules (2 g total) by mouth 2 (two) times daily.    Dispense:  360 capsule    Refill:  4    Order Specific Question:   Supervising Provider    Answer:   Olin Hauser [[id number]]  Follow up plan: Return in about 6 months (around 11/30/2018) for hypertension, cholesterol.  Cassell Smiles, DNP, AGPCNP-BC Adult Gerontology Primary Care Nurse Practitioner [facility] 06/01/2018, 8:13 AM

## 2018-06-01 NOTE — Patient Instructions (Addendum)
Lauren Cervantes,   Thank you for coming in to clinic today.  1. Check BP at least 2 BP readings per week.  Goal < 130/80  - Call clinic if regularly over that goal to consider increasing losartan.  2. START Vascepa 2 g (2 capsules) twice daily.  3. Stop Krill oil  4. Continue all other meds without changes.  5. You will be due for FASTING BLOOD WORK.  This means you should eat no food or drink after midnight.  Drink only water or coffee without cream/sugar on the morning of your lab visit. - Call clinic to release labs and send to [facility] in about 1 month. - Your results will be available about 2-3 days after blood draw.  If you have set up a MyChart account, you can can log in to MyChart online to view your results and a brief explanation. Also, we can discuss your results together at your next office visit if you would like.  Please schedule a follow-up appointment with Cassell Smiles, AGNP. Return in about 6 months (around 11/30/2018) for hypertension, cholesterol.  If you have any other questions or concerns, please feel free to call the clinic or send a message through [place]. You may also schedule an earlier appointment if necessary.  You will receive a survey after today's visit either digitally by e-mail or paper by [employer]. Your experiences and feedback matter to [place].  Please respond so we know how we are doing as we provide care for you.   Cassell Smiles, DNP, AGNP-BC Adult Gerontology Nurse Practitioner [place]

## 2018-06-07 ENCOUNTER — Encounter: Payer: Self-pay | Admitting: Nurse Practitioner

## 2018-06-13 ENCOUNTER — Telehealth: Payer: Self-pay

## 2018-06-13 DIAGNOSIS — Z7983 Long term (current) use of bisphosphonates: Secondary | ICD-10-CM | POA: Diagnosis not present

## 2018-06-13 DIAGNOSIS — M85852 Other specified disorders of bone density and structure, left thigh: Secondary | ICD-10-CM | POA: Diagnosis not present

## 2018-06-13 DIAGNOSIS — M810 Age-related osteoporosis without current pathological fracture: Secondary | ICD-10-CM | POA: Diagnosis not present

## 2018-06-13 DIAGNOSIS — Z780 Asymptomatic menopausal state: Secondary | ICD-10-CM | POA: Diagnosis not present

## 2018-06-13 DIAGNOSIS — M818 Other osteoporosis without current pathological fracture: Secondary | ICD-10-CM | POA: Diagnosis not present

## 2018-06-13 LAB — HM DEXA SCAN

## 2018-06-13 NOTE — Telephone Encounter (Signed)
The pt called requesting that we change her diagnosis on her order for her bone density. She is requesting it be changed from Osteoporosis to Screening code. I informed the patient that we cannot change the diagnosis because it's no longer a screening when you have been diagnosed with Osteoporosis. It's now a follow scan from her previous scans to monitor response to treatment.  She verbalize understanding, but stress that now its going to cost her $400.00.

## 2018-06-13 NOTE — Telephone Encounter (Signed)
Correct.  Patient will need diagnostic repeat eval and cannot be changed to screening.

## 2018-06-21 ENCOUNTER — Telehealth: Payer: Self-pay | Admitting: Nurse Practitioner

## 2018-06-21 DIAGNOSIS — I10 Essential (primary) hypertension: Secondary | ICD-10-CM

## 2018-06-21 MED ORDER — LOSARTAN POTASSIUM 100 MG PO TABS: 100.0000 mg | ORAL_TABLET | Freq: Every day | ORAL | 1 refills | Status: DC

## 2018-06-21 NOTE — Telephone Encounter (Signed)
New prescription sent.  Please inform patient this is max dose of this medication and should not be doubled again.

## 2018-06-21 NOTE — Telephone Encounter (Signed)
Attempted to contact the pt, no answer. LMOM to return my call.  

## 2018-06-21 NOTE — Telephone Encounter (Signed)
Pt called said that she had to double her losartan (said it was staying at 127) without doubling bp was saying at 130/140 said that you may need to Write a new prescription for losartan

## 2018-06-22 NOTE — Telephone Encounter (Signed)
The pt was notified. No questions or concerns. 

## 2018-06-27 ENCOUNTER — Other Ambulatory Visit: Payer: Self-pay | Admitting: Nurse Practitioner

## 2018-06-27 DIAGNOSIS — I10 Essential (primary) hypertension: Secondary | ICD-10-CM

## 2018-06-27 DIAGNOSIS — R000 Tachycardia, unspecified: Secondary | ICD-10-CM

## 2018-06-28 ENCOUNTER — Telehealth: Payer: Self-pay | Admitting: Nurse Practitioner

## 2018-06-28 DIAGNOSIS — E782 Mixed hyperlipidemia: Secondary | ICD-10-CM

## 2018-06-28 DIAGNOSIS — M810 Age-related osteoporosis without current pathological fracture: Secondary | ICD-10-CM

## 2018-06-28 DIAGNOSIS — M818 Other osteoporosis without current pathological fracture: Principal | ICD-10-CM

## 2018-06-28 NOTE — Telephone Encounter (Signed)
The ezetimibe was not sent as 90 day refill.  Please send 90 day refill to [facility] in [place].  Pt's call back [phone]

## 2018-06-28 NOTE — Progress Notes (Signed)
Order(s) created erroneously. Erroneous order ID: [id number]  Order moved by: Darden Palmer  Order move date/time: 06/28/2018 1:48 PM  Source Patient: [id number]  Source Contact: 05/12/2017  Destination Patient: [id number]  Destination Contact: 07/11/2012  Erroneous order ID: [id number]  Order moved by: Darden Palmer  Order move date/time: 06/28/2018 1:48 PM  Source Patient: [id number]  Source Contact: 05/12/2017  Destination Patient: [id number]  Destination Contact: 07/11/2012  Erroneous order ID: [id number]  Order moved by: Darden Palmer  Order move date/time: 06/28/2018 1:48 PM  Source Patient: [id number]  Source Contact: 05/12/2017  Destination Patient: [id number]  Destination Contact: 07/11/2012  Erroneous order ID: [id number]  Order moved by: Darden Palmer  Order move date/time: 06/28/2018 1:48 PM  Source Patient: [id number]  Source Contact: 05/12/2017  Destination Patient: [id number]  Destination Contact: 07/11/2012

## 2018-06-28 NOTE — Telephone Encounter (Signed)
Osteoporosis Lumbar Spine  03/03/2007: spine L1-4 -> BMD 0.875  02/26/2015: spine L1-4 -> BMD 0.703  06/13/2018: spine L1-4 -> BMD 0.716 (% change +1.9%)     Z score -1.6    T score -3.0 DEXA: last bone density scan 06/13/2018 indicates osteoporosis - Stable from 2016   Osteopenia LEFT femur 03/03/2007:  BMD 0.923 02/26/2015:  BMD 0.862 06/13/2018:  BMD 0.875 (% change +1.5%)     Z score 0.4    T score -0.5 DEXA: last bone density scan 06/13/2018 indicates osteoporosis - Stable from 2016

## 2018-06-29 MED ORDER — EZETIMIBE 10 MG PO TABS: 10.0000 mg | ORAL_TABLET | Freq: Every day | ORAL | 2 refills | Status: DC

## 2018-08-09 ENCOUNTER — Encounter: Payer: Self-pay | Admitting: Nurse Practitioner

## 2018-10-06 ENCOUNTER — Ambulatory Visit (INDEPENDENT_AMBULATORY_CARE_PROVIDER_SITE_OTHER): Payer: BC Managed Care – PPO

## 2018-10-06 ENCOUNTER — Other Ambulatory Visit: Payer: Self-pay

## 2018-10-06 DIAGNOSIS — Z23 Encounter for immunization: Secondary | ICD-10-CM | POA: Diagnosis not present

## 2018-10-06 NOTE — Progress Notes (Signed)
hingr

## 2018-10-06 NOTE — Patient Instructions (Signed)
Influenza (Flu) Vaccine (Inactivated or Recombinant): What You Need to Know 1. Why get vaccinated? Influenza vaccine can prevent influenza (flu). Flu is a contagious disease that spreads around the [place] every year, usually between October and May. Anyone can get the flu, but it is more dangerous for some people. Infants and young children, people 61 years of age and older, pregnant women, and people with certain health conditions or a weakened immune system are at greatest risk of flu complications. Pneumonia, bronchitis, sinus infections and ear infections are examples of flu-related complications. If you have a medical condition, such as heart disease, cancer or diabetes, flu can make it worse. Flu can cause fever and chills, sore throat, muscle aches, fatigue, cough, headache, and runny or stuffy nose. Some people may have vomiting and diarrhea, though this is more common in children than adults. Each year thousands of people in the [place] die from flu, and many more are hospitalized. Flu vaccine prevents millions of illnesses and flu-related visits to the doctor each year. 2. Influenza vaccine CDC recommends everyone 57 months of age and older get vaccinated every flu season. Children 6 months through 2 years of age may need 2 doses during a single flu season. Everyone else needs only 1 dose each flu season. It takes about 2 weeks for protection to develop after vaccination. There are many flu viruses, and they are always changing. Each year a new flu vaccine is made to protect against three or four viruses that are likely to cause disease in the upcoming flu season. Even when the vaccine doesn't exactly match these viruses, it may still provide some protection. Influenza vaccine does not cause flu. Influenza vaccine may be given at the same time as other vaccines. 3. Talk with your health care provider Tell your vaccine provider if the person getting the vaccine:  Has had an  allergic reaction after a previous dose of influenza vaccine, or has any severe, life-threatening allergies.  Has ever had Guillain-Barr Syndrome (also called GBS). In some cases, your health care provider may decide to postpone influenza vaccination to a future visit. People with minor illnesses, such as a cold, may be vaccinated. People who are moderately or severely ill should usually wait until they recover before getting influenza vaccine. Your health care provider can give you more information. 4. Risks of a vaccine reaction  Soreness, redness, and swelling where shot is given, fever, muscle aches, and headache can happen after influenza vaccine.  There may be a very small increased risk of Guillain-Barr Syndrome (GBS) after inactivated influenza vaccine (the flu shot). Young children who get the flu shot along with pneumococcal vaccine (PCV13), and/or DTaP vaccine at the same time might be slightly more likely to have a seizure caused by fever. Tell your health care provider if a child who is getting flu vaccine has ever had a seizure. People sometimes faint after medical procedures, including vaccination. Tell your provider if you feel dizzy or have vision changes or ringing in the ears. As with any medicine, there is a very remote chance of a vaccine causing a severe allergic reaction, other serious injury, or death. 5. What if there is a serious problem? An allergic reaction could occur after the vaccinated person leaves the clinic. If you see signs of a severe allergic reaction (hives, swelling of the face and throat, difficulty breathing, a fast heartbeat, dizziness, or weakness), call 9-1-1 and get the person to the nearest hospital. For other signs that  concern you, call your health care provider. Adverse reactions should be reported to the Vaccine Adverse Event Reporting System (VAERS). Your health care provider will usually file this report, or you can do it yourself. Visit the  VAERS website at www.vaers.SamedayNews.es or call 3053237253.VAERS is only for reporting reactions, and VAERS staff do not give medical advice. 6. The National Vaccine Injury Compensation Program The [employer] Vaccine Injury Compensation Program (VICP) is a federal program that was created to compensate people who may have been injured by certain vaccines. Visit the VICP website at GoldCloset.com.ee or call 250-833-3499 to learn about the program and about filing a claim. There is a time limit to file a claim for compensation. 7. How can I learn more?  Ask your healthcare provider.  Call your local or state health department.  Contact the Centers for Disease Control and Prevention (CDC): ? Call (858)021-7624 (1-800-CDC-INFO) or ? Visit CDC's https://gibson.com/ Vaccine Information Statement (Interim) Inactivated Influenza Vaccine (12/08/2017) This information is not intended to replace advice given to you by your health care provider. Make sure you discuss any questions you have with your health care provider. Document Released: 02/04/2006 Document Revised: 12/12/2017 Document Reviewed: 12/12/2017 Elsevier Interactive Patient Education  2019 [employer].  Live Zoster (Shingles) Vaccine, ZVL: What You Need to Know 1. What is shingles? Shingles (also called herpes zoster, or just zoster) is a painful skin rash, often with blisters. Shingles is caused by the varicella zoster virus, the same virus that causes chickenpox. After you have chickenpox, the virus stays in your body and can cause shingles later in life. You can't catch shingles from another person. However, a person who has never had chickenpox (or chickenpox vaccine) could get chickenpox from someone with shingles. A shingles rash usually appears on one side of the face or body and heals within 2 to 4 weeks. Its main symptom is pain, which can be severe. Other symptoms can include fever, headache, chills, and upset stomach.  Very rarely, a shingles infection can lead to pneumonia, hearing problems, blindness, brain inflammation (encephalitis), or death. For about 1 person in 5, severe pain can continue even long after the rash has cleared up. This long-lasting pain is called post-herpetic neuralgia (PHN). Shingles is far more common in people 72 years of age and older than in younger people, and the risk increases with age. It is also more common in people whose immune system is weakened because of a disease such as cancer or by drugs such as steroids or chemotherapy. At least 1 million people a year in the [place] get shingles. 2. Shingles vaccine (live) A live shingles vaccine was approved by FDA in 2006. In a clinical trial, the vaccine reduced the risk of shingles by about 50% in people 60 and older. It can reduce the likelihood of PHN, and reduce pain in some people who still get shingles after being vaccinated. The recommended schedule for live shingles vaccine is a single dose for adults 14 years of age and older. 3. Some people should not get this vaccine Tell your vaccine provider if you:  Have any severe, life-threatening allergies. A person who has ever had a life-threatening allergic reaction after a dose of live shingles vaccine, or has a severe allergy to any component of this vaccine, may be advised not to be vaccinated. Ask your health care provider if you want information about vaccine components.  Are pregnant, or think you might be pregnant. Pregnant women should wait to get live  shingles vaccine until they are no longer pregnant. Women should avoid getting pregnant for at least 1 month after getting shingles vaccine.  Have a weakened immune system due to disease (such as cancer or AIDS) or medical treatments (such as radiation, immunotherapy, high-dose steroids, or chemotherapy).  Are not feeling well. If you have a mild illness, such as a cold, you can probably get the vaccine today. If you are  moderately or severely ill, you should probably wait until you recover. Your doctor can advise you. 4. Risks of a vaccine reaction With any medicine, including vaccines, there is a chance of reactions. After live shingles vaccination, a person might experience:  Redness, soreness, swelling, or itching at the site of the injection  Headache These events are usually mild and go away on their own. Rarely, live shingles vaccine can cause rash or shingles. Other things that could happen after this vaccine:  People sometimes faint after medical procedures, including vaccination. Sitting or lying down for about 15 minutes can help prevent fainting and injuries caused by a fall. Tell your provider if you feel dizzy or have vision changes or ringing in the ears.  Some people get shoulder pain that can be more severe and longer-lasting than routine soreness that can follow injections. This happens very rarely.  Any medication can cause a severe allergic reaction. Such reactions to a vaccine are estimated at about 1 in a million doses, and would happen within a few minutes to a few hours after the vaccination. As with any medicine, there is a very remote chance of a vaccine causing a serious injury or death. The safety of vaccines is always being monitored. For more information, visit: http://www.aguilar.org/ 5. What if there is a serious problem? What should I look for?  Look for anything that concerns you, such as signs of a severe allergic reaction, very high fever, or unusual behavior. Signs of a severe allergic reaction can include hives, swelling of the face and throat, difficulty breathing, a fast heartbeat, dizziness, and weakness. These would usually start a few minutes to a few hours after the vaccination. What should I do?  If you think it is a severe allergic reaction or other emergency that can't wait, call 9-1-1 and get to the nearest hospital. Otherwise, call your health care  provider.  Afterward, the reaction should be reported to the Vaccine Adverse Event Reporting System (VAERS). Your doctor should file this report, or you can do it yourself through the VAERS web site at www.vaers.SamedayNews.es, or by calling 862-183-9157. VAERS does not give medical advice. 6. How can I learn more?  Ask your healthcare provider. He or she can give you the vaccine package insert or suggest other sources of information.  Call your local or state health department.  Contact the Centers for Disease Control and Prevention (CDC): ? Call 631 221 3867(1-800-CDC-INFO) or ? Visit CDC's website at http://hunter.com/ CDC Vaccine Information Statement Live Zoster Vaccine (06/07/2016) This information is not intended to replace advice given to you by your health care provider. Make sure you discuss any questions you have with your health care provider. Document Released: 02/07/2006 Document Revised: 11/22/2017 Document Reviewed: 11/22/2017 Elsevier Interactive Patient Education  2019 [employer].

## 2018-12-04 ENCOUNTER — Ambulatory Visit: Payer: BLUE CROSS/BLUE SHIELD | Admitting: Nurse Practitioner

## 2018-12-16 ENCOUNTER — Other Ambulatory Visit: Payer: Self-pay | Admitting: Nurse Practitioner

## 2018-12-16 DIAGNOSIS — I10 Essential (primary) hypertension: Secondary | ICD-10-CM

## 2018-12-16 DIAGNOSIS — E782 Mixed hyperlipidemia: Secondary | ICD-10-CM

## 2019-01-15 ENCOUNTER — Other Ambulatory Visit: Payer: Self-pay | Admitting: Nurse Practitioner

## 2019-01-15 DIAGNOSIS — B001 Herpesviral vesicular dermatitis: Secondary | ICD-10-CM

## 2019-03-18 ENCOUNTER — Other Ambulatory Visit: Payer: Self-pay | Admitting: Nurse Practitioner

## 2019-03-18 DIAGNOSIS — E782 Mixed hyperlipidemia: Secondary | ICD-10-CM

## 2019-04-17 DIAGNOSIS — Z20828 Contact with and (suspected) exposure to other viral communicable diseases: Secondary | ICD-10-CM | POA: Diagnosis not present

## 2019-04-17 DIAGNOSIS — B349 Viral infection, unspecified: Secondary | ICD-10-CM | POA: Diagnosis not present

## 2019-04-17 DIAGNOSIS — R05 Cough: Secondary | ICD-10-CM | POA: Diagnosis not present

## 2019-06-11 DIAGNOSIS — H524 Presbyopia: Secondary | ICD-10-CM | POA: Diagnosis not present

## 2019-06-11 DIAGNOSIS — H11153 Pinguecula, bilateral: Secondary | ICD-10-CM | POA: Diagnosis not present

## 2019-06-11 DIAGNOSIS — H5210 Myopia, unspecified eye: Secondary | ICD-10-CM | POA: Diagnosis not present

## 2019-06-20 ENCOUNTER — Other Ambulatory Visit: Payer: Self-pay

## 2019-06-20 DIAGNOSIS — I10 Essential (primary) hypertension: Secondary | ICD-10-CM

## 2019-06-20 MED ORDER — LOSARTAN POTASSIUM 100 MG PO TABS: ORAL_TABLET | ORAL | 0 refills | Status: DC

## 2019-06-21 NOTE — Telephone Encounter (Signed)
Appt scheduled on Monday, March 29th @ 8:00am

## 2019-07-03 DIAGNOSIS — Z23 Encounter for immunization: Secondary | ICD-10-CM | POA: Diagnosis not present

## 2019-07-18 ENCOUNTER — Other Ambulatory Visit: Payer: Self-pay

## 2019-07-18 DIAGNOSIS — I10 Essential (primary) hypertension: Secondary | ICD-10-CM

## 2019-07-18 MED ORDER — LOSARTAN POTASSIUM 100 MG PO TABS: ORAL_TABLET | ORAL | 0 refills | Status: DC

## 2019-07-23 ENCOUNTER — Encounter: Payer: Self-pay | Admitting: Family Medicine

## 2019-07-23 ENCOUNTER — Other Ambulatory Visit: Payer: Self-pay

## 2019-07-23 ENCOUNTER — Ambulatory Visit: Payer: BC Managed Care – PPO | Admitting: Family Medicine

## 2019-07-23 VITALS — BP 127/76 | HR 68 | Temp 97.1°F | Ht 66.0 in | Wt 174.6 lb

## 2019-07-23 DIAGNOSIS — R319 Hematuria, unspecified: Secondary | ICD-10-CM

## 2019-07-23 DIAGNOSIS — Z1211 Encounter for screening for malignant neoplasm of colon: Secondary | ICD-10-CM

## 2019-07-23 DIAGNOSIS — I10 Essential (primary) hypertension: Secondary | ICD-10-CM

## 2019-07-23 DIAGNOSIS — E782 Mixed hyperlipidemia: Secondary | ICD-10-CM

## 2019-07-23 DIAGNOSIS — M810 Age-related osteoporosis without current pathological fracture: Secondary | ICD-10-CM

## 2019-07-23 DIAGNOSIS — R635 Abnormal weight gain: Secondary | ICD-10-CM

## 2019-07-23 DIAGNOSIS — R7303 Prediabetes: Secondary | ICD-10-CM | POA: Diagnosis not present

## 2019-07-23 DIAGNOSIS — B001 Herpesviral vesicular dermatitis: Secondary | ICD-10-CM

## 2019-07-23 DIAGNOSIS — E781 Pure hyperglyceridemia: Secondary | ICD-10-CM

## 2019-07-23 LAB — POCT URINALYSIS DIPSTICK
Bilirubin, UA: NEGATIVE
Glucose, UA: NEGATIVE
Ketones, UA: NEGATIVE
Leukocytes, UA: NEGATIVE
Nitrite, UA: NEGATIVE
Protein, UA: NEGATIVE
Spec Grav, UA: 1.015 (ref 1.010–1.025)
Urobilinogen, UA: 0.2 E.U./dL
pH, UA: 5 (ref 5.0–8.0)

## 2019-07-23 MED ORDER — IBANDRONATE SODIUM 150 MG PO TABS: 150.0000 mg | ORAL_TABLET | ORAL | 3 refills | Status: AC

## 2019-07-23 MED ORDER — CARVEDILOL 25 MG PO TABS: ORAL_TABLET | ORAL | 3 refills | Status: DC

## 2019-07-23 MED ORDER — ACYCLOVIR 400 MG PO TABS: 400.0000 mg | ORAL_TABLET | Freq: Two times a day (BID) | ORAL | 1 refills | Status: DC | PRN

## 2019-07-23 MED ORDER — LOSARTAN POTASSIUM 100 MG PO TABS: ORAL_TABLET | ORAL | 0 refills | Status: DC

## 2019-07-23 MED ORDER — BLOOD GLUCOSE METER KIT: PACK | 0 refills | Status: DC

## 2019-07-23 MED ORDER — EZETIMIBE 10 MG PO TABS: ORAL_TABLET | ORAL | 2 refills | Status: DC

## 2019-07-23 MED ORDER — ICOSAPENT ETHYL 1 G PO CAPS: 2.0000 g | ORAL_CAPSULE | Freq: Two times a day (BID) | ORAL | 4 refills | Status: DC

## 2019-07-23 MED ORDER — ROSUVASTATIN CALCIUM 20 MG PO TABS: ORAL_TABLET | ORAL | 1 refills | Status: DC

## 2019-07-23 MED ORDER — ONETOUCH VERIO VI STRP: ORAL_STRIP | 3 refills | Status: DC

## 2019-07-23 MED ORDER — CALCIUM 1000 + D 1000-800 MG-UNIT PO TABS: 1.0000 | ORAL_TABLET | Freq: Every day | ORAL | 2 refills | Status: AC

## 2019-07-23 MED ORDER — VALACYCLOVIR HCL 1 G PO TABS: ORAL_TABLET | ORAL | 1 refills | Status: DC

## 2019-07-23 NOTE — Assessment & Plan Note (Signed)
Unknown control, labs last 04/2017.  Currently taking rosuvastatin 20mg  daily and tolerating it well.   Plan: 1) Labs ordered today 2) Continue rosuvastatin 20mg  daily 3) Heart healthy diet and to exercise every other day for 30 minutes per day, going no more than 2 days in a row without exercise. 4) We will see you back in 6 months

## 2019-07-23 NOTE — Assessment & Plan Note (Signed)
Patient with osteoporosis and currently taking Calcium 1000+D daily and Boniva once monthly.  Plan: 1) Continue current medications 2) Plan for weight bearing exercises daily, such as walking for 30 minutes daily. 3) Follow up in 6 months.

## 2019-07-23 NOTE — Assessment & Plan Note (Signed)
Status unknown.  Recheck labs.  Has reported not taking Metformin, will review labs and create updated treatment plan.

## 2019-07-23 NOTE — Assessment & Plan Note (Signed)
Stable and well controlled with PRN usage of valacyclovir as directed.  Refills provided.

## 2019-07-23 NOTE — Progress Notes (Signed)
Subjective:    Patient ID: Lauren Cervantes, female    DOB: [birth year], 64 y.o.   MRN: [id number]  Lauren Cervantes is a 64 y.o. female presenting on 07/23/2019 for Hypertension   HPI  HEALTH MAINTENANCE: Lauren Cervantes presents to clinic today for a follow up on her hypertension, hyperlipidemia and prediabetes.  No acute concerns today.  ROS negative.  Hypertension - She is checking BP at home or outside of clinic.  Readings reported as similar in clinic - Current medications: carvedilol 81m twice daily and losartan 1069mdaily, tolerating well without side effects - She is not currently symptomatic. - Pt denies headache, lightheadedness, dizziness, changes in vision, chest tightness/pressure, palpitations, leg swelling, sudden loss of speech or loss of consciousness. - She  reports no regular exercise routine. - Her diet is moderate in salt, moderate in fat, and moderate in carbohydrates.  Weight/BMI: 4lb weight gain in 2 years Physical activity: No structured exercise Diet: Regular Seatbelt: Always Mammogram: Completed 05/10/2018 - DUE -05/10/2020 DEXA: Completed 2020 Colon cancer screening: Cologuard ordered today HIV & Hep C Screening: Completed 05/12/2017 Optometry: Once a year Dentistry: Twice a year  IMMUNIZATIONS: Influenza: Due next season Tetanus: Due - will complete in 6 months Shingles: First dose completed 04/2017 COVID: 07/03/2019   [facility] 07/23/2019 06/01/2018 02/03/2017  Decreased Interest 0 0 0  Down, Depressed, Hopeless 0 0 0  PHQ - 2 Score 0 0 0  Altered sleeping - 2 2  Tired, decreased energy - 1 2  Change in appetite - 0 0  Feeling bad or failure about yourself  - 0 0  Trouble concentrating - 0 0  Moving slowly or fidgety/restless - 0 0  Suicidal thoughts - 0 0  PHQ-9 Score - 3 4  Difficult doing work/chores - Not difficult at all Not difficult at all    Past Medical History:  Diagnosis Date  . [place]  .  Hyperlipidemia   . Hypertension   . Lymphedema of arm    left arm.   Past Surgical History:  Procedure Laterality Date  . BREAST LUMPECTOMY    . TOTAL ABDOMINAL HYSTERECTOMY     Social History   Socioeconomic History  . Marital status: Significant Other    Spouse name: Not on file  . Number of children: Not on file  . Years of education: Not on file  . Highest education level: Not on file  Occupational History  . Occupation: OfWriterTobacco Use  . Smoking status: Current Every Day Smoker    Packs/day: 0.05    Years: 30.00    Pack years: 1.50    Types: Cigarettes    Last attempt to quit: 05/04/2011    Years since quitting: 8.2  . Smokeless tobacco: Never Used  . Tobacco comment: Patient using the electronic cigarette  Substance and Sexual Activity  . Alcohol use: Yes    Alcohol/week: 2.0 standard drinks    Types: 1 Glasses of wine, 1 Standard drinks or equivalent per week    Comment: 1 a day most days  . Drug use: No  . Sexual activity: Not on file  Other Topics Concern  . Not on file  Social History Narrative   Regular exercise. Getting back into after cancer         Social Determinants of Health   Financial Resource Strain:   . Difficulty of Paying Living Expenses:   Food Insecurity:   .  Worried About Charity fundraiser in the Last Year:   . Arboriculturist in the Last Year:   Transportation Needs:   . Film/video editor (Medical):   Marland Kitchen Lack of Transportation (Non-Medical):   Physical Activity:   . Days of Exercise per Week:   . Minutes of Exercise per Session:   Stress:   . Feeling of Stress :   Social Connections:   . Frequency of Communication with Friends and Family:   . Frequency of Social Gatherings with Friends and Family:   . Attends Religious Services:   . Active Member of Clubs or Organizations:   . Attends Archivist Meetings:   Marland Kitchen Marital Status:   Intimate Partner Violence:   . Fear of Current or  Ex-Partner:   . Emotionally Abused:   Marland Kitchen Physically Abused:   . Sexually Abused:    Family History  Problem Relation Age of Onset  . Hypertension Mother   . Hypertension Father   . Stroke Father   . Cancer Unknown   . Coronary artery disease Unknown   . Stroke Unknown   . Hyperlipidemia Unknown   . Hypertension Unknown    Current Outpatient Medications on File Prior to Visit  Medication Sig  . anastrozole (ARIMIDEX) 1 MG tablet Take 1 mg by mouth daily.    Marland Kitchen aspirin EC 81 MG tablet Take by mouth.  . Blood Glucose Monitoring Suppl (ONETOUCH VERIO) w/Device KIT U UTD  . Multiple Vitamins-Minerals (MULTIVITAMIN WITH MINERALS) tablet Take 1 tablet by mouth daily.    Glory Rosebush DELICA LANCETS FINE MISC USE AS DIRECTED ONCE DAILY   No current facility-administered medications on file prior to visit.    Per HPI unless specifically indicated above     Objective:    BP 127/76 (BP Location: Right Arm, Patient Position: Sitting, Cuff Size: Normal)   Pulse 68   Temp (!) 97.1 F (36.2 C) (Temporal)   Ht '5\' 6"'  (1.676 m)   Wt 174 lb 9.6 oz (79.2 kg)   BMI 28.18 kg/m   Wt Readings from Last 3 Encounters:  07/23/19 174 lb 9.6 oz (79.2 kg)  06/01/18 170 lb 12.8 oz (77.5 kg)  05/06/17 166 lb 6.4 oz (75.5 kg)    Physical Exam Vitals reviewed.  Constitutional:      General: She is not in acute distress.    Appearance: Normal appearance. She is well-groomed and overweight. She is not ill-appearing or toxic-appearing.  HENT:     Head: Normocephalic.     Right Ear: Tympanic membrane, ear canal and external ear normal. There is no impacted cerumen.     Left Ear: Tympanic membrane, ear canal and external ear normal. There is no impacted cerumen.     Nose: No congestion.     Mouth/Throat:     Lips: Pink.     Pharynx: Uvula midline.  Eyes:     General: Lids are normal. Vision grossly intact. No scleral icterus.       Right eye: No discharge.        Left eye: No discharge.      Extraocular Movements: Extraocular movements intact.     Conjunctiva/sclera: Conjunctivae normal.     Pupils: Pupils are equal, round, and reactive to light.  Neck:     Thyroid: No thyroid mass or thyromegaly.  Cardiovascular:     Rate and Rhythm: Normal rate and regular rhythm.     Pulses: Normal pulses.  Dorsalis pedis pulses are 2+ on the right side and 2+ on the left side.       Posterior tibial pulses are 2+ on the right side and 2+ on the left side.     Heart sounds: Normal heart sounds. No murmur. No friction rub. No gallop.   Pulmonary:     Effort: Pulmonary effort is normal. No respiratory distress.     Breath sounds: Normal breath sounds.  Abdominal:     General: Abdomen is flat. Bowel sounds are normal. There is no distension.     Palpations: Abdomen is soft. There is no hepatomegaly, splenomegaly or mass.     Tenderness: There is no abdominal tenderness. There is no guarding or rebound.     Hernia: No hernia is present.  Musculoskeletal:        General: Normal range of motion.     Cervical back: Normal range of motion and neck supple. No tenderness.     Right lower leg: No edema.     Left lower leg: No edema.  Feet:     Right foot:     Skin integrity: Skin integrity normal.     Left foot:     Skin integrity: Skin integrity normal.  Lymphadenopathy:     Cervical: No cervical adenopathy.  Skin:    General: Skin is warm and dry.     Capillary Refill: Capillary refill takes less than 2 seconds.  Neurological:     General: No focal deficit present.     Mental Status: She is alert and oriented to person, place, and time.     Cranial Nerves: No cranial nerve deficit.     Sensory: No sensory deficit.     Motor: No weakness.     Coordination: Coordination normal.     Gait: Gait normal.     Deep Tendon Reflexes: Reflexes normal.  Psychiatric:        Attention and Perception: Attention and perception normal.        Mood and Affect: Mood and affect normal.         Speech: Speech normal.        Behavior: Behavior normal. Behavior is cooperative.        Thought Content: Thought content normal.        Cognition and Memory: Cognition and memory normal.        Judgment: Judgment normal.     Results for orders placed or performed in visit on 07/23/19  POCT Urinalysis Dipstick  Result Value Ref Range   Color, UA Yellow    Clarity, UA clear    Glucose, UA Negative Negative   Bilirubin, UA negative    Ketones, UA negative    Spec Grav, UA 1.015 1.010 - 1.025   Blood, UA trace    pH, UA 5.0 5.0 - 8.0   Protein, UA Negative Negative   Urobilinogen, UA 0.2 0.2 or 1.0 E.U./dL   Nitrite, UA negative    Leukocytes, UA Negative Negative   Appearance     Odor        Assessment & Plan:   Problem List Items Addressed This Visit      Cardiovascular and Mediastinum   ESSENTIAL HYPERTENSION, MALIGNANT - Primary    Controlled hypertension.  BP goal < 130/80.  Pt working on lifestyle modifications.  Taking medications tolerating well without side effects. No known complications.  Plan: 1. Continue taking coreg 19m twice daily and losartan 1086mdaily 2. Obtain labs ordered  today  3. Encouraged heart healthy diet and increasing exercise to 30 minutes most days of the week, going no more than 2 days in a row without exercise. 4. Check BP 1-2 x per week at home, keep log, and bring to clinic at next appointment. 5. Follow up 6 months.         Relevant Medications   carvedilol (COREG) 25 MG tablet   rosuvastatin (CRESTOR) 20 MG tablet   losartan (COZAAR) 100 MG tablet   icosapent Ethyl (VASCEPA) 1 g capsule   ezetimibe (ZETIA) 10 MG tablet   Other Relevant Orders   POCT Urinalysis Dipstick (Completed)   Urine Microalbumin w/creat. ratio   CBC with Differential   COMPLETE METABOLIC PANEL WITH GFR     Digestive   Herpes labialis    Stable and well controlled with PRN usage of valacyclovir as directed.  Refills provided.       Relevant  Medications   acyclovir (ZOVIRAX) 400 MG tablet   valACYclovir (VALTREX) 1000 MG tablet     Musculoskeletal and Integument   Osteoporosis of lumbar spine    Patient with osteoporosis and currently taking Calcium 1000+D daily and Boniva once monthly.  Plan: 1) Continue current medications 2) Plan for weight bearing exercises daily, such as walking for 30 minutes daily. 3) Follow up in 6 months.      Relevant Medications   Calcium Carb-Cholecalciferol (CALCIUM 1000 + D) 1000-800 MG-UNIT TABS   ibandronate (BONIVA) 150 MG tablet     Other   Hyperlipidemia    Unknown control, labs last 04/2017.  Currently taking rosuvastatin 40m daily and tolerating it well.   Plan: 1) Labs ordered today 2) Continue rosuvastatin 268mdaily 3) Heart healthy diet and to exercise every other day for 30 minutes per day, going no more than 2 days in a row without exercise. 4) We will see you back in 6 months       Relevant Medications   carvedilol (COREG) 25 MG tablet   rosuvastatin (CRESTOR) 20 MG tablet   losartan (COZAAR) 100 MG tablet   icosapent Ethyl (VASCEPA) 1 g capsule   ezetimibe (ZETIA) 10 MG tablet   Other Relevant Orders   Lipid Profile   Pre-diabetes    Status unknown.  Recheck labs.  Has reported not taking Metformin, will review labs and create updated treatment plan.      Relevant Medications   glucose blood (ONETOUCH VERIO) test strip   blood glucose meter kit and supplies   Other Relevant Orders   Urine Microalbumin w/creat. ratio   HgB A1c    Other Visit Diagnoses    Weight gain       Relevant Orders   Thyroid Panel With TSH   Hypertriglyceridemia       Relevant Medications   carvedilol (COREG) 25 MG tablet   rosuvastatin (CRESTOR) 20 MG tablet   losartan (COZAAR) 100 MG tablet   icosapent Ethyl (VASCEPA) 1 g capsule   ezetimibe (ZETIA) 10 MG tablet   Screening for malignant neoplasm of colon       Relevant Orders   Cologuard      Meds ordered this  encounter  Medications  . acyclovir (ZOVIRAX) 400 MG tablet    Sig: Take 1 tablet (400 mg total) by mouth 2 (two) times daily as needed.    Dispense:  30 tablet    Refill:  1  . Calcium Carb-Cholecalciferol (CALCIUM 1000 + D) 1000-800 MG-UNIT TABS  Sig: Take 1 tablet by mouth daily. Vitamin D 434m    Dispense:  90 tablet    Refill:  2  . carvedilol (COREG) 25 MG tablet    Sig: TAKE 1 TABLET(25 MG) BY MOUTH TWICE DAILY WITH A MEAL    Dispense:  180 tablet    Refill:  3  . rosuvastatin (CRESTOR) 20 MG tablet    Sig: TAKE 1 TABLET BY MOUTH AT BEDTIME    Dispense:  90 tablet    Refill:  1  . valACYclovir (VALTREX) 1000 MG tablet    Sig: TAKE 2 TABLETS BY MOUTH ONCE AT START OF A COLD SORE. TAKE 1 TABLET DAILY UNTIL FEVER BILSTER IS GONE OR FOR TOTAL OF 5 DAYS    Dispense:  20 tablet    Refill:  1  . glucose blood (ONETOUCH VERIO) test strip    Sig: USE ONCE DAILY AS DIRECTED    Dispense:  100 each    Refill:  3  . losartan (COZAAR) 100 MG tablet    Sig: TAKE 1 TABLET(100 MG) BY MOUTH DAILY    Dispense:  30 tablet    Refill:  0    Patient needs appt for additional refills.  .Marland Kitchenicosapent Ethyl (VASCEPA) 1 g capsule    Sig: Take 2 capsules (2 g total) by mouth 2 (two) times daily.    Dispense:  360 capsule    Refill:  4  . ibandronate (BONIVA) 150 MG tablet    Sig: Take 1 tablet (150 mg total) by mouth every 30 (thirty) days for 3 doses.    Dispense:  3 tablet    Refill:  3  . ezetimibe (ZETIA) 10 MG tablet    Sig: TAKE 1 TABLET(10 MG) BY MOUTH DAILY    Dispense:  90 tablet    Refill:  2  . blood glucose meter kit and supplies    Sig: Dispense based on patient and insurance preference. Use once daily as directed. (FOR ICD-9 250.00, 250.01).    Dispense:  1 each    Refill:  0    Order Specific Question:   Number of strips    Answer:   100    Order Specific Question:   Number of lancets    Answer:   100      Follow up plan: Return in about 6 months (around 01/23/2020)  for HTN, HLD, Prediabetes F/U.  NHarlin Rain FNP-C Family Nurse Practitioner [facility] 07/23/2019, 8:13 AM

## 2019-07-23 NOTE — Patient Instructions (Signed)
As we discussed, have your labs drawn in the next 1-2 weeks and we will contact you with the results.  Try to get exercise a minimum of 30 minutes per day at least 5 days per week as well as  adequate water intake all while measuring blood pressure a few times per week.  Keep a blood pressure log and bring back to clinic at your next visit.  If your readings are consistently over 140/90 to contact our office/send me a MyChart message and we will see you sooner.  Can try DASH and Mediterranean diet options, avoiding processed foods, lowering sodium intake, avoiding pork products, and eating a plant based diet for optimal health.  Having high HDL (good) cholesterol and low triglyceride levels can reduce your risk of heart attack and stroke.  The following steps can be taken to reduce triglyceride levels and increase HDL (good) cholesterol levels.  Avoid or limit alcohol Alcohol significantly raises triglyceride levels.  If your triglycerides are higher than 150, avoid or limit alcohol.  Limit fruit juice and dried fruits Even fresh fruit juice contains a large amount of fructose sugar and can increase triglyceride and blood sugar levels.  Fruit juice and dried fruit are also high in calories and can add unwanted pounds.  Replace fruit juice and dried fruits with fresh fruit that has more fiber and fewer calories.  Limit non-diet soft drinks and sports drinks Non-diet soft drinks and sport drinks contain as many as 12 teaspoons of sugar per serving.  This sugar can increase triglyceride and blood sugar levels.  Non-diet soft drinks and sport drinks also contain calories that add unwanted pounds.  Replace non-diet soft drinks and sports drinks with water, unsweetened tea, diet colas or beverages sweetened with Nutra-Sweet.  Limit concentrated sweets Even fat free or low fat sweets can raise your triglyceride and/or blood sugar levels.  Concentrated sweets include sugar, honey, jelly, candy, cookies,  cakes, pies, pastries, frozen desserts, puddings, and sugar sweetened cereals.  Replace concentrated sweets with high fiber foods such as fruit, low fat yogurt, sugar free gelatin and low fat puddings.  Limit refined carbohydrates Refined carbohydrates include white flour, white bread, white rice, and some pasta.  Limit portion sizes of these foods or replace them with whole wheat bread, lentils, whole grains, brown rice, and spinach or whole-wheat pastas.  Reduce your weight, if needed Even a 5-10 pound weight loss can cause your triglyceride level to decrease significantly.  You can lose 1-2 pounds per week by limiting your portion sizes and exercising 5-6 times per week.  Include monounsaturated fats in your diet.  Include Monounsaturated fats in your diet Moderate amounts of monounsaturated fat can raise your HDL (good) cholesterol.  Sources of monounsaturated fat include olive oil, canola oil, peanut oil, peanuts, peanut butter, cashews, olives, and avocados.  Choose peanut butter that does not have sugar in the ingredient list.  Since these foods are high in calories, serving sizes should be moderate.  Include fish in your diet Fish is low in saturated fat and rich in Omega-3 fatty acids.  Good choices include mackerel, salmon, herring, bluefish, lake trout, tuna, and sardines canned in oil.  If you smoke, quit Smoking lowers HDL (good) cholesterol and raises triglycerides.  When you quit smoking your HDL (good) cholesterol increases and triglycerides decrease.  Stay active Exercise raises your HDL (good) cholesterol.  Walk, ride a bike, or swim for at least 20 minutes, 3-5 times per week.  Ideally, you should  try to exercise for 30-60 minutes most days of the week.  Check with your Healthcare Provider before beginning an exercise program.  We will plan to see you back in 6 months for follow up on your hypertension, hyperlipidemia, and prediabetes  You will receive a survey after today's  visit either digitally by e-mail or paper by [employer]. Your experiences and feedback matter to [place].  Please respond so we know how we are doing as we provide care for you.  Call us with any questions/concerns/needs.  It is my goal to be available to you for your health concerns.  Thanks for choosing me to be a partner in your healthcare needs!  Harlin Rain, FNP-C Family Nurse Practitioner [facility] Phone: [phone]

## 2019-07-23 NOTE — Assessment & Plan Note (Signed)
Controlled hypertension.  BP goal < 130/80.  Pt working on lifestyle modifications.  Taking medications tolerating well without side effects. No known complications.  Plan: 1. Continue taking coreg 20mg  twice daily and losartan 100mg  daily 2. Obtain labs ordered today  3. Encouraged heart healthy diet and increasing exercise to 30 minutes most days of the week, going no more than 2 days in a row without exercise. 4. Check BP 1-2 x per week at home, keep log, and bring to clinic at next appointment. 5. Follow up 6 months.

## 2019-07-24 LAB — LIPID PANEL
Cholesterol: 109 mg/dL (ref <200)
HDL: 35 mg/dL — ABNORMAL LOW (ref >=50)
LDL Cholesterol (Calc): 43 mg/dL (calc)
Non-HDL Cholesterol (Calc): 74 mg/dL (calc) (ref <130)
Total CHOL/HDL Ratio: 3.1 (calc) (ref <5.0)
Triglycerides: 262 mg/dL — ABNORMAL HIGH (ref <150)

## 2019-07-24 LAB — MICROALBUMIN / CREATININE URINE RATIO
Creatinine, Urine: 78 mg/dL (ref 20–275)
Microalb Creat Ratio: 5 mcg/mg creat (ref <30)
Microalb, Ur: 0.4 mg/dL

## 2019-07-24 LAB — CBC WITH DIFFERENTIAL/PLATELET
Absolute Monocytes: 496 cells/uL (ref 200–950)
Basophils Absolute: 41 cells/uL (ref 0–200)
Basophils Relative: 0.6 %
Eosinophils Absolute: 102 cells/uL (ref 15–500)
Eosinophils Relative: 1.5 %
HCT: 39.8 % (ref 35.0–45.0)
Hemoglobin: 13.3 g/dL (ref 11.7–15.5)
Lymphs Abs: 2264 cells/uL (ref 850–3900)
MCH: 32.4 pg (ref 27.0–33.0)
MCHC: 33.4 g/dL (ref 32.0–36.0)
MCV: 97.1 fL (ref 80.0–100.0)
MPV: 11.4 fL (ref 7.5–12.5)
Monocytes Relative: 7.3 %
Neutro Abs: 3896 cells/uL (ref 1500–7800)
Neutrophils Relative %: 57.3 %
Platelets: 184 10*3/uL (ref 140–400)
RBC: 4.1 10*6/uL (ref 3.80–5.10)
RDW: 13.1 % (ref 11.0–15.0)
Total Lymphocyte: 33.3 %
WBC: 6.8 10*3/uL (ref 3.8–10.8)

## 2019-07-24 LAB — COMPLETE METABOLIC PANEL WITH GFR
AG Ratio: 2 (calc) (ref 1.0–2.5)
ALT: 33 U/L — ABNORMAL HIGH (ref 6–29)
AST: 22 U/L (ref 10–35)
Albumin: 4.4 g/dL (ref 3.6–5.1)
Alkaline phosphatase (APISO): 53 U/L (ref 37–153)
BUN: 11 mg/dL (ref 7–25)
CO2: 25 mmol/L (ref 20–32)
Calcium: 9.4 mg/dL (ref 8.6–10.4)
Chloride: 108 mmol/L (ref 98–110)
Creat: 0.51 mg/dL (ref 0.50–0.99)
GFR, Est African American: 120 mL/min/{1.73_m2} (ref >=60)
GFR, Est Non African American: 104 mL/min/{1.73_m2} (ref >=60)
Globulin: 2.2 g/dL (calc) (ref 1.9–3.7)
Glucose, Bld: 119 mg/dL — ABNORMAL HIGH (ref 65–99)
Potassium: 4.4 mmol/L (ref 3.5–5.3)
Sodium: 141 mmol/L (ref 135–146)
Total Bilirubin: 0.4 mg/dL (ref 0.2–1.2)
Total Protein: 6.6 g/dL (ref 6.1–8.1)

## 2019-07-24 LAB — URINALYSIS, MICROSCOPIC ONLY
Bacteria, UA: NONE SEEN /HPF
Hyaline Cast: NONE SEEN /LPF
RBC / HPF: NONE SEEN /HPF (ref 0–2)
Squamous Epithelial / HPF: NONE SEEN /HPF (ref <=5)
WBC, UA: NONE SEEN /HPF (ref 0–5)

## 2019-07-24 LAB — HEMOGLOBIN A1C
Hgb A1c MFr Bld: 6.3 % of total Hgb — ABNORMAL HIGH (ref <5.7)
Mean Plasma Glucose: 134 (calc)
eAG (mmol/L): 7.4 (calc)

## 2019-07-24 LAB — THYROID PANEL WITH TSH
Free Thyroxine Index: 1.9 (ref 1.4–3.8)
T3 Uptake: 33 % (ref 22–35)
T4, Total: 5.7 ug/dL (ref 5.1–11.9)
TSH: 1.8 mIU/L (ref 0.40–4.50)

## 2019-07-30 NOTE — Progress Notes (Signed)
Labs are within normal limits (CBC, urine, thyroid and CMP).  Glucose was elevated, but expected this as A1C is 6.3%. Elevated triglycerides (historically elevated).  Is on Zetia and Vascepa - is she taking both medications? Thanks

## 2019-07-31 DIAGNOSIS — Z23 Encounter for immunization: Secondary | ICD-10-CM | POA: Diagnosis not present

## 2019-08-14 ENCOUNTER — Other Ambulatory Visit: Payer: Self-pay | Admitting: Family Medicine

## 2019-08-14 DIAGNOSIS — I10 Essential (primary) hypertension: Secondary | ICD-10-CM

## 2019-08-14 NOTE — Telephone Encounter (Signed)
Approved per protocol for 90 day supply. Last OV 07/23/19.

## 2019-09-07 DIAGNOSIS — Z1231 Encounter for screening mammogram for malignant neoplasm of breast: Secondary | ICD-10-CM | POA: Diagnosis not present

## 2019-10-08 DIAGNOSIS — H524 Presbyopia: Secondary | ICD-10-CM | POA: Diagnosis not present

## 2019-10-08 DIAGNOSIS — H16142 Punctate keratitis, left eye: Secondary | ICD-10-CM | POA: Diagnosis not present

## 2019-10-08 DIAGNOSIS — H5210 Myopia, unspecified eye: Secondary | ICD-10-CM | POA: Diagnosis not present

## 2019-10-09 DIAGNOSIS — Z20822 Contact with and (suspected) exposure to covid-19: Secondary | ICD-10-CM | POA: Diagnosis not present

## 2019-10-09 DIAGNOSIS — Z03818 Encounter for observation for suspected exposure to other biological agents ruled out: Secondary | ICD-10-CM | POA: Diagnosis not present

## 2019-10-10 ENCOUNTER — Other Ambulatory Visit: Payer: Self-pay

## 2019-10-10 ENCOUNTER — Ambulatory Visit (INDEPENDENT_AMBULATORY_CARE_PROVIDER_SITE_OTHER): Payer: BC Managed Care – PPO | Admitting: Family Medicine

## 2019-10-10 ENCOUNTER — Encounter: Payer: Self-pay | Admitting: Family Medicine

## 2019-10-10 VITALS — BP 132/70 | HR 79 | Temp 98.4°F | Ht 66.0 in | Wt 169.6 lb

## 2019-10-10 DIAGNOSIS — B9689 Other specified bacterial agents as the cause of diseases classified elsewhere: Secondary | ICD-10-CM

## 2019-10-10 DIAGNOSIS — H109 Unspecified conjunctivitis: Secondary | ICD-10-CM | POA: Insufficient documentation

## 2019-10-10 MED ORDER — POLYMYXIN B-TRIMETHOPRIM 10000-0.1 UNIT/ML-% OP SOLN: 2.0000 [drp] | OPHTHALMIC | 0 refills | Status: DC

## 2019-10-10 NOTE — Assessment & Plan Note (Signed)
Current bacterial conjunctivitis of both eyes.  Symptoms started approximately 4 days ago and just in the left eye. Had met with her ophthalmologist and was told may have been a stray hair from a haircut that had gotten into her left eye and was causing symptoms.  Reports no corneal abrasion noted on exam, has not been wearing her contacts and her eye provider sent in a prescription for steroid eye drops that she has not yet picked up.  Has had worsening of symptoms and they have started in the right eye as well.  Plan: 1. Begin polytrim drops, 2 drops in each eye, every 4 hours for the next 5-7 days 2. Wash hands well before/after touching face 3. Avoid contacts until infection has cleared 4. Return to clinic as needed

## 2019-10-10 NOTE — Progress Notes (Signed)
Subjective:    Patient ID: Lauren Cervantes, female    DOB: 10/28/1955, 64 y.o.   MRN: [id number]  Lauren Cervantes is a 64 y.o. female presenting on 10/10/2019 for Conjunctivitis (bilatral red eyes, itchy eyes, watery eyes, mucus drainage w/ yellowish discharge, sensitivity light x 4 days. She went to th on Monday and they thought she possible just got something in the left eye and irritated it, but now its both eyes)   HPI  [name] presents to clinic for evaluation of bilateral eye symptoms.  Reports 4 days ago she started to have left eye redness and discomfort.  Went to the eye doctor and was told she may have gotten a hair in her eye from a haircut the day prior and reports no corneal abrasion on staining of the eye.  Was prescribed steroid eye drops, but the pharmacy had to order them so she has not picked them up yet.  Since Monday, she has had an increase in symptoms that are now effecting the right eye as well.  Reports red eyes, itchy, water eyes with yellow mucus drainage, light sensitivity and awakening with eye lids caked together in the morning.  No one else in the home with similar symptoms.  Is contact lens wearer but has switched back to her glasses when her symptoms first presented.  No visual changes.  Depression screen [facility] 2/9 07/23/2019 06/01/2018 02/03/2017  Decreased Interest 0 0 0  Down, Depressed, Hopeless 0 0 0  PHQ - 2 Score 0 0 0  Altered sleeping - 2 2  Tired, decreased energy - 1 2  Change in appetite - 0 0  Feeling bad or failure about yourself  - 0 0  Trouble concentrating - 0 0  Moving slowly or fidgety/restless - 0 0  Suicidal thoughts - 0 0  PHQ-9 Score - 3 4  Difficult doing work/chores - Not difficult at all Not difficult at all    Social History   Tobacco Use  . Smoking status: Current Every Day Smoker    Packs/day: 0.05    Years: 30.00    Pack years: 1.50    Types: Cigarettes    Last attempt to quit: 05/04/2011    Years since quitting: 8.4   . Smokeless tobacco: Never Used  . Tobacco comment: Patient using the electronic cigarette  Vaping Use  . Vaping Use: Former  Substance Use Topics  . Alcohol use: Yes    Alcohol/week: 2.0 standard drinks    Types: 1 Glasses of wine, 1 Standard drinks or equivalent per week    Comment: 1 a day most days  . Drug use: No    Review of Systems  Constitutional: Negative.   HENT: Negative.   Eyes: Positive for photophobia, discharge, redness and itching. Negative for pain and visual disturbance.  Respiratory: Negative.   Cardiovascular: Negative.   Gastrointestinal: Negative.   Endocrine: Negative.   Genitourinary: Negative.   Musculoskeletal: Negative.   Skin: Negative.   Allergic/Immunologic: Negative.   Neurological: Negative.   Hematological: Negative.   Psychiatric/Behavioral: Negative.    Per HPI unless specifically indicated above     Objective:    BP 132/70   Pulse 79   Temp 98.4 F (36.9 C) (Oral)   Ht _0  (1.676 m)   Wt 169 lb 9.6 oz (76.9 kg)   BMI 27.37 kg/m   Wt Readings from Last 3 Encounters:  10/10/19 169 lb 9.6 oz (76.9 kg)  07/23/19 174  lb 9.6 oz (79.2 kg)  06/01/18 170 lb 12.8 oz (77.5 kg)    Physical Exam Vitals reviewed.  Constitutional:      General: She is not in acute distress.    Appearance: Normal appearance. She is well-developed, well-groomed and overweight. She is not ill-appearing or toxic-appearing.  HENT:     Head: Normocephalic and atraumatic.     Nose:     Right Sinus: No maxillary sinus tenderness or frontal sinus tenderness.     Left Sinus: No maxillary sinus tenderness or frontal sinus tenderness.  Eyes:     General: Vision grossly intact. No allergic shiner, visual field deficit or scleral icterus.       Right eye: Discharge present. No foreign body or hordeolum.        Left eye: Discharge present.No foreign body or hordeolum.     Extraocular Movements: Extraocular movements intact.     Conjunctiva/sclera:     Right eye:  Right conjunctiva is injected. Exudate and hemorrhage present. No chemosis.    Left eye: Left conjunctiva is injected. Exudate present. No chemosis or hemorrhage.    Pupils: Pupils are equal, round, and reactive to light.     Comments: Residual caking on bilateral upper and lower lids.  Slight edema to bilateral upper and lower lids  Cardiovascular:     Pulses: Normal pulses.  Pulmonary:     Effort: Pulmonary effort is normal. No respiratory distress.  Skin:    General: Skin is warm and dry.     Capillary Refill: Capillary refill takes less than 2 seconds.  Neurological:     General: No focal deficit present.     Mental Status: She is alert and oriented to person, place, and time.     Cranial Nerves: No cranial nerve deficit.  Psychiatric:        Attention and Perception: Attention and perception normal.        Mood and Affect: Mood and affect normal.        Speech: Speech normal.        Behavior: Behavior normal. Behavior is cooperative.        Thought Content: Thought content normal.        Cognition and Memory: Cognition and memory normal.        Judgment: Judgment normal.    Results for orders placed or performed in visit on 07/23/19  CBC with Differential  Result Value Ref Range   WBC 6.8 3.8 - 10.8 Thousand/uL   RBC 4.10 3.80 - 5.10 Million/uL   Hemoglobin 13.3 11.7 - 15.5 g/dL   HCT 27.9 35 - 45 %   MCV 97.1 80.0 - 100.0 fL   MCH 32.4 27.0 - 33.0 pg   MCHC 33.4 32.0 - 36.0 g/dL   RDW 64.9 71.3 - 00.8 %   Platelets 184 140 - 400 Thousand/uL   MPV 11.4 7.5 - 12.5 fL   Neutro Abs 3,896 1,500 - 7,800 cells/uL   Lymphs Abs 2,264 850 - 3,900 cells/uL   Absolute Monocytes 496 200 - 950 cells/uL   Eosinophils Absolute 102 15 - 500 cells/uL   Basophils Absolute 41 0 - 200 cells/uL   Neutrophils Relative % 57.3 %   Total Lymphocyte 33.3 %   Monocytes Relative 7.3 %   Eosinophils Relative 1.5 %   Basophils Relative 0.6 %  COMPLETE METABOLIC PANEL WITH GFR  Result Value  Ref Range   Glucose, Bld 119 (H) 65 - 99 mg/dL   BUN  11 7 - 25 mg/dL   Creat 0.51 0.50 - 0.99 mg/dL   GFR, Est Non African American 104 > OR = 60 mL/min/1.71m   GFR, Est African American 120 > OR = 60 mL/min/1.748m  BUN/Creatinine Ratio NOT APPLICABLE 6 - 22 (calc)   Sodium 141 135 - 146 mmol/L   Potassium 4.4 3.5 - 5.3 mmol/L   Chloride 108 98 - 110 mmol/L   CO2 25 20 - 32 mmol/L   Calcium 9.4 8.6 - 10.4 mg/dL   Total Protein 6.6 6.1 - 8.1 g/dL   Albumin 4.4 3.6 - 5.1 g/dL   Globulin 2.2 1.9 - 3.7 g/dL (calc)   AG Ratio 2.0 1.0 - 2.5 (calc)   Total Bilirubin 0.4 0.2 - 1.2 mg/dL   Alkaline phosphatase (APISO) 53 37 - 153 U/L   AST 22 10 - 35 U/L   ALT 33 (H) 6 - 29 U/L  Lipid Profile  Result Value Ref Range   Cholesterol 109 <200 mg/dL   HDL 35 (L) > OR = 50 mg/dL   Triglycerides 262 (H) <150 mg/dL   LDL Cholesterol (Calc) 43 mg/dL (calc)   Total CHOL/HDL Ratio 3.1 <5.0 (calc)   Non-HDL Cholesterol (Calc) 74 <130 mg/dL (calc)  Thyroid Panel With TSH  Result Value Ref Range   T3 Uptake 33 22 - 35 %   T4, Total 5.7 5.1 - 11.9 mcg/dL   Free Thyroxine Index 1.9 1.4 - 3.8   TSH 1.80 0.40 - 4.50 mIU/L  HgB A1c  Result Value Ref Range   Hgb A1c MFr Bld 6.3 (H) <5.7 % of total Hgb   Mean Plasma Glucose 134 (calc)   eAG (mmol/L) 7.4 (calc)  Microalbumin / creatinine urine ratio  Result Value Ref Range   Creatinine, Urine 78 20 - 275 mg/dL   Microalb, Ur 0.4 mg/dL   Microalb Creat Ratio 5 <30 mcg/mg creat  Urine Microscopic  Result Value Ref Range   WBC, UA NONE SEEN 0 - 5 /HPF   RBC / HPF NONE SEEN 0 - 2 /HPF   Squamous Epithelial / LPF NONE SEEN < OR = 5 /HPF   Bacteria, UA NONE SEEN NONE SEEN /HPF   Hyaline Cast NONE SEEN NONE SEEN /LPF  POCT Urinalysis Dipstick  Result Value Ref Range   Color, UA Yellow    Clarity, UA clear    Glucose, UA Negative Negative   Bilirubin, UA negative    Ketones, UA negative    Spec Grav, UA 1.015 1.010 - 1.025   Blood, UA trace     pH, UA 5.0 5.0 - 8.0   Protein, UA Negative Negative   Urobilinogen, UA 0.2 0.2 or 1.0 E.U./dL   Nitrite, UA negative    Leukocytes, UA Negative Negative   Appearance     Odor        Assessment & Plan:   Problem List Items Addressed This Visit      Other   Bacterial conjunctivitis of both eyes - Primary    Current bacterial conjunctivitis of both eyes.  Symptoms started approximately 4 days ago and just in the left eye. Had met with her ophthalmologist and was told may have been a stray hair from a haircut that had gotten into her left eye and was causing symptoms.  Reports no corneal abrasion noted on exam, has not been wearing her contacts and her eye provider sent in a prescription for steroid eye drops that she has not  yet picked up.  Has had worsening of symptoms and they have started in the right eye as well.  Plan: 1. Begin polytrim drops, 2 drops in each eye, every 4 hours for the next 5-7 days 2. Wash hands well before/after touching face 3. Avoid contacts until infection has cleared 4. Return to clinic as needed      Relevant Medications   trimethoprim-polymyxin b (POLYTRIM) ophthalmic solution      Meds ordered this encounter  Medications  . trimethoprim-polymyxin b (POLYTRIM) ophthalmic solution    Sig: Place 2 drops into both eyes every 4 (four) hours.    Dispense:  10 mL    Refill:  0      Follow up plan: Return if symptoms worsen or fail to improve.   Harlin Rain, [place] [facility] Nurse Practitioner [facility] 10/10/2019, 9:25 AM

## 2019-10-10 NOTE — Patient Instructions (Signed)
As we discussed, you have bacterial conjunctivitis, AKA "pink eye".  This is contagious until you have been on antibiotics for 24 hours.  I have sent in a prescription for Polytrim eye drops to use 2 drops in each eye for the next 5-7 days.  Avoid wearing your contacts until this infection has cleared.  Wash your hands well before and after touching your face and be sure the eye dropper bottle does not touch your eye.  Wash your pillow cases in hot water and dry on hot heat to prevent reinfection.  We will plan to see you back as needed for this  You will receive a survey after today's visit either digitally by e-mail or paper by [place] mail. Your experiences and feedback matter to [place].  Please respond so we know how we are doing as we provide care for you.  Call us with any questions/concerns/needs.  It is my goal to be available to you for your health concerns.  Thanks for choosing me to be a partner in your healthcare needs!  Harlin Rain, FNP-C Family Nurse Practitioner [facility] Phone: [phone]

## 2019-10-15 ENCOUNTER — Encounter: Payer: Self-pay | Admitting: Family Medicine

## 2019-10-15 ENCOUNTER — Other Ambulatory Visit: Payer: Self-pay

## 2019-10-15 ENCOUNTER — Ambulatory Visit (INDEPENDENT_AMBULATORY_CARE_PROVIDER_SITE_OTHER): Payer: BC Managed Care – PPO | Admitting: Family Medicine

## 2019-10-15 VITALS — Temp 98.0°F

## 2019-10-15 DIAGNOSIS — Z03818 Encounter for observation for suspected exposure to other biological agents ruled out: Secondary | ICD-10-CM | POA: Diagnosis not present

## 2019-10-15 DIAGNOSIS — J0100 Acute maxillary sinusitis, unspecified: Secondary | ICD-10-CM | POA: Diagnosis not present

## 2019-10-15 DIAGNOSIS — Z1159 Encounter for screening for other viral diseases: Secondary | ICD-10-CM | POA: Diagnosis not present

## 2019-10-15 DIAGNOSIS — J329 Chronic sinusitis, unspecified: Secondary | ICD-10-CM | POA: Insufficient documentation

## 2019-10-15 MED ORDER — CETIRIZINE HCL 10 MG PO TABS: 10.0000 mg | ORAL_TABLET | Freq: Every day | ORAL | 1 refills | Status: DC

## 2019-10-15 MED ORDER — FLUTICASONE PROPIONATE 50 MCG/ACT NA SUSP: 2.0000 | Freq: Every day | NASAL | 1 refills | Status: DC

## 2019-10-15 MED ORDER — BENZONATATE 100 MG PO CAPS: 100.0000 mg | ORAL_CAPSULE | Freq: Two times a day (BID) | ORAL | 0 refills | Status: DC | PRN

## 2019-10-15 MED ORDER — AMOXICILLIN-POT CLAVULANATE 875-125 MG PO TABS: 1.0000 | ORAL_TABLET | Freq: Two times a day (BID) | ORAL | 0 refills | Status: DC

## 2019-10-15 NOTE — Patient Instructions (Signed)
As we discussed, take the Augmentin, 1 tablet 2x per day for the next 10 days.  This medication can be hard on your stomach, be sure to take this with food.  I have sent in a prescription for cetirizine 10mg , to take 1 tablet daily.  Flonase, to use 1-2 puffs into each nostril daily, and for tessalon perles to take 1 perle twice daily for cough.  Be sure to rest and to stay hydrated.  We will plan to see you back as needed for this  You will receive a survey after today's visit either digitally by e-mail or paper by [place] mail. Your experiences and feedback matter to [place].  Please respond so we know how we are doing as we provide care for you.  Call us with any questions/concerns/needs.  It is my goal to be available to you for your health concerns.  Thanks for choosing me to be a partner in your healthcare needs!  Harlin Rain, FNP-C Family Nurse Practitioner [facility] Phone: [phone]

## 2019-10-15 NOTE — Progress Notes (Signed)
Virtual Visit via Telephone  The purpose of this virtual visit is to provide medical care while limiting exposure to the novel coronavirus (COVID19) for both patient and office staff.  Consent was obtained for phone visit:  Yes.   Answered questions that patient had about telehealth interaction:  Yes.   I discussed the limitations, risks, security and privacy concerns of performing an evaluation and management service by telephone. I also discussed with the patient that there may be a patient responsible charge related to this service. The patient expressed understanding and agreed to proceed.  Patient is at home and is accessed via telephone Services are provided by Harlin Rain, FNP-C from [facility])  ---------------------------------------------------------------------- Chief Complaint  Patient presents with  . Sore Throat    headaches, sinus pressure, coughing, low grade fever 99.7 temp,  and Rt ear fullness x 4 days     S: Reviewed CMA documentation. I have called patient and gathered additional HPI as follows:  Lauren Cervantes presents for telemedicine visit for concerns of headaches, sinus pressure, intermittent coughing, low grade fever to 99.1F, right ear fullness and purulent nasal drainage x 4 days.  States her symptoms started as a sore throat and headache, the sore throat has gotten better.  Has not had much drainage from her nose when she blows it, but has had yellow drainage since her symptoms have started.  Has been exposed to someone who had a cold in the past 2 weeks.  Denies any change in taste/smell, SOB, CP, abdominal pain, n/v/d.  Patient is currently home Denies any high risk travel to areas of current concern for COVID19. Denies any known or suspected exposure to person with or possibly with COVID19.   Past Medical History:  Diagnosis Date  . Breast cancer ([place]) 2010  . Hyperlipidemia   . Hypertension   . Lymphedema of arm    left  arm.   Social History   Tobacco Use  . Smoking status: Current Every Day Smoker    Packs/day: 0.05    Years: 30.00    Pack years: 1.50    Types: Cigarettes    Last attempt to quit: 05/04/2011    Years since quitting: 8.4  . Smokeless tobacco: Never Used  . Tobacco comment: Patient using the electronic cigarette  Vaping Use  . Vaping Use: Former  Substance Use Topics  . Alcohol use: Yes    Alcohol/week: 2.0 standard drinks    Types: 1 Glasses of wine, 1 Standard drinks or equivalent per week    Comment: 1 a day most days  . Drug use: No    Current Outpatient Medications:  .  acyclovir (ZOVIRAX) 400 MG tablet, Take 1 tablet (400 mg total) by mouth 2 (two) times daily as needed., Disp: 30 tablet, Rfl: 1 .  anastrozole (ARIMIDEX) 1 MG tablet, Take 1 mg by mouth daily.  , Disp: , Rfl:  .  aspirin EC 81 MG tablet, Take by mouth., Disp: , Rfl:  .  blood glucose meter kit and supplies, Dispense based on patient and insurance preference. Use once daily as directed. (FOR ICD-9 250.00, 250.01)., Disp: 1 each, Rfl: 0 .  Blood Glucose Monitoring Suppl (ONETOUCH VERIO) w/Device KIT, U UTD, Disp: , Rfl:  .  Calcium Carb-Cholecalciferol (CALCIUM 1000 + D) 1000-800 MG-UNIT TABS, Take 1 tablet by mouth daily. Vitamin D 417m, Disp: 90 tablet, Rfl: 2 .  carvedilol (COREG) 25 MG tablet, TAKE 1 TABLET(25 MG) BY MOUTH TWICE  DAILY WITH A MEAL, Disp: 180 tablet, Rfl: 3 .  dextromethorphan-guaiFENesin (MUCINEX DM) 30-600 MG 12hr tablet, Take 1 tablet by mouth 2 (two) times daily., Disp: , Rfl:  .  ezetimibe (ZETIA) 10 MG tablet, TAKE 1 TABLET(10 MG) BY MOUTH DAILY, Disp: 90 tablet, Rfl: 2 .  glucose blood (ONETOUCH VERIO) test strip, USE ONCE DAILY AS DIRECTED, Disp: 100 each, Rfl: 3 .  icosapent Ethyl (VASCEPA) 1 g capsule, Take 2 capsules (2 g total) by mouth 2 (two) times daily., Disp: 360 capsule, Rfl: 4 .  losartan (COZAAR) 100 MG tablet, TAKE 1 TABLET(100 MG) BY MOUTH DAILY, Disp: 90 tablet, Rfl: 0 .   Multiple Vitamins-Minerals (MULTIVITAMIN WITH MINERALS) tablet, Take 1 tablet by mouth daily.  , Disp: , Rfl:  .  ONETOUCH DELICA LANCETS FINE MISC, USE AS DIRECTED ONCE DAILY, Disp: 100 each, Rfl: 3 .  rosuvastatin (CRESTOR) 20 MG tablet, TAKE 1 TABLET BY MOUTH AT BEDTIME, Disp: 90 tablet, Rfl: 1 .  amoxicillin-clavulanate (AUGMENTIN) 875-125 MG tablet, Take 1 tablet by mouth 2 (two) times daily., Disp: 20 tablet, Rfl: 0 .  benzonatate (TESSALON) 100 MG capsule, Take 1 capsule (100 mg total) by mouth 2 (two) times daily as needed for cough., Disp: 20 capsule, Rfl: 0 .  cetirizine (ZYRTEC) 10 MG tablet, Take 1 tablet (10 mg total) by mouth daily., Disp: 30 tablet, Rfl: 1 .  fluticasone (FLONASE) 50 MCG/ACT nasal spray, Place 2 sprays into both nostrils daily., Disp: 16 g, Rfl: 1 .  valACYclovir (VALTREX) 1000 MG tablet, TAKE 2 TABLETS BY MOUTH ONCE AT START OF A COLD SORE. TAKE 1 TABLET DAILY UNTIL FEVER BILSTER IS GONE OR FOR TOTAL OF 5 DAYS, Disp: 20 tablet, Rfl: 1  Depression screen [facility] 2/9 07/23/2019 06/01/2018 02/03/2017  Decreased Interest 0 0 0  Down, Depressed, Hopeless 0 0 0  PHQ - 2 Score 0 0 0  Altered sleeping - 2 2  Tired, decreased energy - 1 2  Change in appetite - 0 0  Feeling bad or failure about yourself  - 0 0  Trouble concentrating - 0 0  Moving slowly or fidgety/restless - 0 0  Suicidal thoughts - 0 0  PHQ-9 Score - 3 4  Difficult doing work/chores - Not difficult at all Not difficult at all    No flowsheet data found.  -------------------------------------------------------------------------- O: No physical exam performed due to remote telephone encounter.  Physical Exam: Patient remotely monitored without video.  Verbal communication appropriate.  Cognition normal.  Recent Results (from the past 2160 hour(s))  POCT Urinalysis Dipstick     Status: Abnormal   Collection Time: 07/23/19  8:14 AM  Result Value Ref Range   Color, UA Yellow    Clarity, UA clear     Glucose, UA Negative Negative   Bilirubin, UA negative    Ketones, UA negative    Spec Grav, UA 1.015 1.010 - 1.025   Blood, UA trace    pH, UA 5.0 5.0 - 8.0   Protein, UA Negative Negative   Urobilinogen, UA 0.2 0.2 or 1.0 E.U./dL   Nitrite, UA negative    Leukocytes, UA Negative Negative   Appearance     Odor    CBC with Differential     Status: None   Collection Time: 07/23/19  8:47 AM  Result Value Ref Range   WBC 6.8 3.8 - 10.8 Thousand/uL   RBC 4.10 3.80 - 5.10 Million/uL   Hemoglobin 13.3 11.7 - 15.5 g/dL  HCT 39.8 35 - 45 %   MCV 97.1 80.0 - 100.0 fL   MCH 32.4 27.0 - 33.0 pg   MCHC 33.4 32.0 - 36.0 g/dL   RDW 13.1 11.0 - 15.0 %   Platelets 184 140 - 400 Thousand/uL   MPV 11.4 7.5 - 12.5 fL   Neutro Abs 3,896 1,500 - 7,800 cells/uL   Lymphs Abs 2,264 850 - 3,900 cells/uL   Absolute Monocytes 496 200 - 950 cells/uL   Eosinophils Absolute 102 15 - 500 cells/uL   Basophils Absolute 41 0 - 200 cells/uL   Neutrophils Relative % 57.3 %   Total Lymphocyte 33.3 %   Monocytes Relative 7.3 %   Eosinophils Relative 1.5 %   Basophils Relative 0.6 %  COMPLETE METABOLIC PANEL WITH GFR     Status: Abnormal   Collection Time: 07/23/19  8:47 AM  Result Value Ref Range   Glucose, Bld 119 (H) 65 - 99 mg/dL    Comment: .            Fasting reference interval . For someone without known diabetes, a glucose value between 100 and 125 mg/dL is consistent with prediabetes and should be confirmed with a follow-up test. .    BUN 11 7 - 25 mg/dL   Creat 0.51 0.50 - 0.99 mg/dL    Comment: For patients >55 years of age, the reference limit for Creatinine is approximately 13% higher for people identified as African-American. .    GFR, Est Non African American 104 > OR = 60 mL/min/1.69m   GFR, Est African American 120 > OR = 60 mL/min/1.773m  BUN/Creatinine Ratio NOT APPLICABLE 6 - 22 (calc)   Sodium 141 135 - 146 mmol/L   Potassium 4.4 3.5 - 5.3 mmol/L   Chloride 108 98 - 110  mmol/L   CO2 25 20 - 32 mmol/L   Calcium 9.4 8.6 - 10.4 mg/dL   Total Protein 6.6 6.1 - 8.1 g/dL   Albumin 4.4 3.6 - 5.1 g/dL   Globulin 2.2 1.9 - 3.7 g/dL (calc)   AG Ratio 2.0 1.0 - 2.5 (calc)   Total Bilirubin 0.4 0.2 - 1.2 mg/dL   Alkaline phosphatase (APISO) 53 37 - 153 U/L   AST 22 10 - 35 U/L   ALT 33 (H) 6 - 29 U/L  Lipid Profile     Status: Abnormal   Collection Time: 07/23/19  8:47 AM  Result Value Ref Range   Cholesterol 109 <200 mg/dL   HDL 35 (L) > OR = 50 mg/dL   Triglycerides 262 (H) <150 mg/dL    Comment: . If a non-fasting specimen was collected, consider repeat triglyceride testing on a fasting specimen if clinically indicated.  JaYates Decampt al. J. of Clin. Lipidol. [id number];0:071-219. Marland Kitchen  LDL Cholesterol (Calc) 43 mg/dL (calc)    Comment: Reference range: <100 . Desirable range <100 mg/dL for primary prevention;   <70 mg/dL for patients with CHD or diabetic patients  with > or = 2 CHD risk factors. . Marland KitchenDL-C is now calculated using the Martin-Hopkins  calculation, which is a validated novel method providing  better accuracy than the Friedewald equation in the  estimation of LDL-C.  MaCresenciano Genret al. JAAnnamaria [id number];[id number]  (http://education.[facility].com/faq/FAQ164)    Total CHOL/HDL Ratio 3.1 <5.0 (calc)   Non-HDL Cholesterol (Calc) 74 <130 mg/dL (calc)    Comment: For patients with diabetes plus 1 major ASCVD risk  factor, treating to a non-HDL-C goal  of <100 mg/dL  (LDL-C of <70 mg/dL) is considered a therapeutic  option.   Thyroid Panel With TSH     Status: None   Collection Time: 07/23/19  8:47 AM  Result Value Ref Range   T3 Uptake 33 22 - 35 %   T4, Total 5.7 5.1 - 11.9 mcg/dL   Free Thyroxine Index 1.9 1.4 - 3.8   TSH 1.80 0.40 - 4.50 mIU/L  HgB A1c     Status: Abnormal   Collection Time: 07/23/19  8:47 AM  Result Value Ref Range   Hgb A1c MFr Bld 6.3 (H) <5.7 % of total Hgb    Comment: For someone without known diabetes, a  hemoglobin  A1c value between 5.7% and 6.4% is consistent with prediabetes and should be confirmed with a  follow-up test. . For someone with known diabetes, a value <7% indicates that their diabetes is well controlled. A1c targets should be individualized based on duration of diabetes, age, comorbid conditions, and other considerations. . This assay result is consistent with an increased risk of diabetes. . Currently, no consensus exists regarding use of hemoglobin A1c for diagnosis of diabetes for children. .    Mean Plasma Glucose 134 (calc)   eAG (mmol/L) 7.4 (calc)  Microalbumin / creatinine urine ratio     Status: None   Collection Time: 07/23/19  8:47 AM  Result Value Ref Range   Creatinine, Urine 78 20 - 275 mg/dL   Microalb, Ur 0.4 mg/dL    Comment: Reference Range Not established    Microalb Creat Ratio 5 <30 mcg/mg creat    Comment: . The ADA defines abnormalities in albumin excretion as follows: Marland Kitchen Category         Result (mcg/mg creatinine) . Normal                    <30 Microalbuminuria         30-299  Clinical albuminuria   > OR = 300 . The ADA recommends that at least two of three specimens collected within a 3-6 month period be abnormal before considering a patient to be within a diagnostic category.   Urine Microscopic     Status: None   Collection Time: 07/23/19  8:47 AM  Result Value Ref Range   WBC, UA NONE SEEN 0 - 5 /HPF   RBC / HPF NONE SEEN 0 - 2 /HPF   Squamous Epithelial / LPF NONE SEEN < OR = 5 /HPF   Bacteria, UA NONE SEEN NONE SEEN /HPF   Hyaline Cast NONE SEEN NONE SEEN /LPF    -------------------------------------------------------------------------- A&P:  Problem List Items Addressed This Visit      Respiratory   Sinusitis - Primary    Acute sinusitis x 4 days, with purulent nasal drainage and low grade temp at 99.50F.  Has been exposed to a sick contact, no known contacts with COVID.  Treating based on reported symptoms and  history.  Plan: 1. Begin augmentin 875/125, 1 tablet twice per day for the next 10 days. 2. Rx for tessalon perles for cough, cetirizine and flonase for sinus pressure/mucus sent to pharmacy on file, to use as directed 3. Can use OTC netti pot or sinus rinse, educated to use sterile water or boiled water that has cooled to room temperature 4. Follow up as needed      Relevant Medications   dextromethorphan-guaiFENesin (MUCINEX DM) 30-600 MG 12hr tablet   benzonatate (TESSALON) 100 MG capsule  amoxicillin-clavulanate (AUGMENTIN) 875-125 MG tablet   fluticasone (FLONASE) 50 MCG/ACT nasal spray   cetirizine (ZYRTEC) 10 MG tablet      Meds ordered this encounter  Medications  . benzonatate (TESSALON) 100 MG capsule    Sig: Take 1 capsule (100 mg total) by mouth 2 (two) times daily as needed for cough.    Dispense:  20 capsule    Refill:  0  . amoxicillin-clavulanate (AUGMENTIN) 875-125 MG tablet    Sig: Take 1 tablet by mouth 2 (two) times daily.    Dispense:  20 tablet    Refill:  0  . fluticasone (FLONASE) 50 MCG/ACT nasal spray    Sig: Place 2 sprays into both nostrils daily.    Dispense:  16 g    Refill:  1  . cetirizine (ZYRTEC) 10 MG tablet    Sig: Take 1 tablet (10 mg total) by mouth daily.    Dispense:  30 tablet    Refill:  1    Follow-up: - Return as needed  Patient verbalizes understanding with the above medical recommendations including the limitation of remote medical advice.  Specific follow-up and call-back criteria were given for patient to follow-up or seek medical care more urgently if needed.  Time spent in direct consultation with patient on phone: 6 minutes   Harlin Rain, [facility] 10/15/2019, 9:42 AM

## 2019-10-15 NOTE — Assessment & Plan Note (Signed)
Acute sinusitis x 4 days, with purulent nasal drainage and low grade temp at 99.28F.  Has been exposed to a sick contact, no known contacts with COVID.  Treating based on reported symptoms and history.  Plan: 1. Begin augmentin 875/125, 1 tablet twice per day for the next 10 days. 2. Rx for tessalon perles for cough, cetirizine and flonase for sinus pressure/mucus sent to pharmacy on file, to use as directed 3. Can use OTC netti pot or sinus rinse, educated to use sterile water or boiled water that has cooled to room temperature 4. Follow up as needed

## 2019-10-22 ENCOUNTER — Telehealth: Payer: Self-pay

## 2019-10-22 NOTE — Telephone Encounter (Signed)
Called pt left VM to take mucinex and Flonase OTC that it should help with her syptoms. VM had verbalized her first and last name.  Will route result note to [facility] Nurse Triage for follow up when patient returns call to clinic.     KP

## 2019-10-22 NOTE — Telephone Encounter (Signed)
Copied from [place] [id number]. Topic: General - Inquiry >> Oct 22, 2019  8:07 AM Scherrie Gerlach wrote: Reason for CRM: pt would like Lauren Cervantes to know her sinus issue, congestion, has just about cleared up.  The only thing now, she is having right ear pain, stopped up feeling and hard to hear out of that ear.  Pt has 2 days of abx left.Marland Kitchen

## 2019-10-22 NOTE — Telephone Encounter (Signed)
Should continue to get better.  If having continued ear pain, can use over the counter nasal spray (flonase) and mucinex.  The mucinex can be taken according to the package directions and she should be able to hear crackling/popping in her ear.  This medication is helping loosen the mucus up so it can drain.  This should help reduce her right ear pain.

## 2019-10-26 ENCOUNTER — Ambulatory Visit (INDEPENDENT_AMBULATORY_CARE_PROVIDER_SITE_OTHER): Payer: BC Managed Care – PPO | Admitting: Family Medicine

## 2019-10-26 ENCOUNTER — Other Ambulatory Visit: Payer: Self-pay

## 2019-10-26 ENCOUNTER — Encounter: Payer: Self-pay | Admitting: Family Medicine

## 2019-10-26 ENCOUNTER — Telehealth: Payer: Self-pay

## 2019-10-26 VITALS — BP 147/79 | HR 74 | Temp 98.7°F | Ht 66.0 in | Wt 165.0 lb

## 2019-10-26 DIAGNOSIS — H6981 Other specified disorders of Eustachian tube, right ear: Secondary | ICD-10-CM | POA: Diagnosis not present

## 2019-10-26 MED ORDER — PREDNISONE 20 MG PO TABS: 40.0000 mg | ORAL_TABLET | Freq: Every day | ORAL | 0 refills | Status: AC

## 2019-10-26 NOTE — Assessment & Plan Note (Signed)
Right sided eustachian tube dysfunction.  Has completed augmentin 1 day ago for a 10 day course for a sinus infection.  Continued popping and clicking with blowing nose or chewing.  Has used flonase and allergy medication without relief of this symptom.  Plan: 1. Begin prednisone 40mg  daily for the next 5 days 2. RTC if symptoms worsen or no improvement with current treatment plan

## 2019-10-26 NOTE — Patient Instructions (Signed)
As we discussed, you have eustachian tube dysfunction.  This is when the eustachian tube of the ear does not fully allow for drainage and can trap air/fluid behind the tympanic membrane.  I have sent in a prescription for Prednisone to take 40mg  daily for the next 5 days.  Take this in the morning.  If you have continued symptoms after finishing this prescription, restart on the flonase and allergy medicine and we can refer you to an ear, nose and throat specialist.  We will plan to see you back as needed for this  You will receive a survey after today's visit either digitally by e-mail or paper by [place] mail. Your experiences and feedback matter to [place].  Please respond so we know how we are doing as we provide care for you.  Call us with any questions/concerns/needs.  It is my goal to be available to you for your health concerns.  Thanks for choosing me to be a partner in your healthcare needs!  Harlin Rain, FNP-C Family Nurse Practitioner [facility] Phone: [phone]

## 2019-10-26 NOTE — Progress Notes (Signed)
Subjective:    Patient ID: Lauren Cervantes, female    DOB: 1955-06-22, 64 y.o.   MRN: [id number]  Lauren Cervantes is a 64 y.o. female presenting on 10/26/2019 for Otitis Media (X3 weeks, ear pain, no drainage, muffled hearing)   HPI  [name] presents to clinic for concerns of right ear pain, has continued for 3 weeks with muffled hearing, popping and clicking sounds when blowing nose or chewing.  Denies any drainage.  10 days ago had a sinus infection and has completed antibiotics with a resolution of all symptoms except for her right ear pain.  Depression screen [facility] 2/9 10/26/2019 07/23/2019 06/01/2018  Decreased Interest 0 0 0  Down, Depressed, Hopeless 0 0 0  PHQ - 2 Score 0 0 0  Altered sleeping 0 - 2  Tired, decreased energy 0 - 1  Change in appetite 0 - 0  Feeling bad or failure about yourself  0 - 0  Trouble concentrating 0 - 0  Moving slowly or fidgety/restless 0 - 0  Suicidal thoughts 0 - 0  PHQ-9 Score 0 - 3  Difficult doing work/chores Not difficult at all - Not difficult at all    Social History   Tobacco Use  . Smoking status: Current Every Day Smoker    Packs/day: 0.05    Years: 30.00    Pack years: 1.50    Types: Cigarettes  . Smokeless tobacco: Never Used  . Tobacco comment: smkes 1-3 cigs a day   Vaping Use  . Vaping Use: Former  Substance Use Topics  . Alcohol use: Yes    Alcohol/week: 2.0 standard drinks    Types: 1 Glasses of wine, 1 Standard drinks or equivalent per week    Comment: 1 a day most days  . Drug use: No    Review of Systems  Constitutional: Negative.   HENT: Positive for ear pain. Negative for congestion, dental problem, drooling, ear discharge, facial swelling, hearing loss, mouth sores, nosebleeds, postnasal drip, rhinorrhea, sinus pressure, sinus pain, sneezing, sore throat, tinnitus, trouble swallowing and voice change.   Eyes: Negative.   Respiratory: Negative.   Cardiovascular: Negative.   Gastrointestinal: Negative.     Endocrine: Negative.   Genitourinary: Negative.   Musculoskeletal: Negative.   Skin: Negative.   Allergic/Immunologic: Negative.   Neurological: Negative.   Hematological: Negative.   Psychiatric/Behavioral: Negative.    Per HPI unless specifically indicated above     Objective:    BP (!) 147/79   Pulse 74   Temp 98.7 F (37.1 C) (Oral)   Ht 5\' 6"  (1.676 m)   Wt 165 lb (74.8 kg)   SpO2 99%   BMI 26.63 kg/m   Wt Readings from Last 3 Encounters:  10/26/19 165 lb (74.8 kg)  10/10/19 169 lb 9.6 oz (76.9 kg)  07/23/19 174 lb 9.6 oz (79.2 kg)    Physical Exam Vitals reviewed.  Constitutional:      General: She is not in acute distress.    Appearance: Normal appearance. She is well-developed and well-groomed. She is not ill-appearing or toxic-appearing.  HENT:     Head: Normocephalic and atraumatic.     Right Ear: Ear canal and external ear normal. No drainage, swelling or tenderness. No middle ear effusion. There is no impacted cerumen. No foreign body. No mastoid tenderness. No PE tube. Tympanic membrane is erythematous and bulging. Tympanic membrane is not injected, scarred, perforated or retracted.     Left Ear: Tympanic membrane, ear  canal and external ear normal. There is no impacted cerumen.     Nose:     Comments: [name] is in place, covering mouth and nose. Eyes:     General: Lids are normal. Vision grossly intact.        Right eye: No discharge.        Left eye: No discharge.     Extraocular Movements: Extraocular movements intact.     Conjunctiva/sclera: Conjunctivae normal.     Pupils: Pupils are equal, round, and reactive to light.  Cardiovascular:     Pulses: Normal pulses.          Dorsalis pedis pulses are 2+ on the right side and 2+ on the left side.  Pulmonary:     Effort: Pulmonary effort is normal. No respiratory distress.  Musculoskeletal:     Right lower leg: No edema.     Left lower leg: No edema.  Skin:    General: Skin is warm and dry.      Capillary Refill: Capillary refill takes less than 2 seconds.  Neurological:     General: No focal deficit present.     Mental Status: She is alert and oriented to person, place, and time.  Psychiatric:        Attention and Perception: Attention and perception normal.        Mood and Affect: Mood and affect normal.        Speech: Speech normal.        Behavior: Behavior normal. Behavior is cooperative.        Thought Content: Thought content normal.        Cognition and Memory: Cognition and memory normal.        Judgment: Judgment normal.    Results for orders placed or performed in visit on 07/23/19  CBC with Differential  Result Value Ref Range   WBC 6.8 3.8 - 10.8 Thousand/uL   RBC 4.10 3.80 - 5.10 Million/uL   Hemoglobin 13.3 11.7 - 15.5 g/dL   HCT 39.8 35 - 45 %   MCV 97.1 80.0 - 100.0 fL   MCH 32.4 27.0 - 33.0 pg   MCHC 33.4 32.0 - 36.0 g/dL   RDW 13.1 11.0 - 15.0 %   Platelets 184 140 - 400 Thousand/uL   MPV 11.4 7.5 - 12.5 fL   Neutro Abs 3,896 1,500 - 7,800 cells/uL   Lymphs Abs 2,264 850 - 3,900 cells/uL   Absolute Monocytes 496 200 - 950 cells/uL   Eosinophils Absolute 102 15 - 500 cells/uL   Basophils Absolute 41 0 - 200 cells/uL   Neutrophils Relative % 57.3 %   Total Lymphocyte 33.3 %   Monocytes Relative 7.3 %   Eosinophils Relative 1.5 %   Basophils Relative 0.6 %  COMPLETE METABOLIC PANEL WITH GFR  Result Value Ref Range   Glucose, Bld 119 (H) 65 - 99 mg/dL   BUN 11 7 - 25 mg/dL   Creat 0.51 0.50 - 0.99 mg/dL   GFR, Est Non African American 104 > OR = 60 mL/min/1.77m2   GFR, Est African American 120 > OR = 60 mL/min/1.29m2   BUN/Creatinine Ratio NOT APPLICABLE 6 - 22 (calc)   Sodium 141 135 - 146 mmol/L   Potassium 4.4 3.5 - 5.3 mmol/L   Chloride 108 98 - 110 mmol/L   CO2 25 20 - 32 mmol/L   Calcium 9.4 8.6 - 10.4 mg/dL   Total Protein 6.6 6.1 - 8.1 g/dL  Albumin 4.4 3.6 - 5.1 g/dL   Globulin 2.2 1.9 - 3.7 g/dL (calc)   AG Ratio 2.0 1.0 - 2.5  (calc)   Total Bilirubin 0.4 0.2 - 1.2 mg/dL   Alkaline phosphatase (APISO) 53 37 - 153 U/L   AST 22 10 - 35 U/L   ALT 33 (H) 6 - 29 U/L  Lipid Profile  Result Value Ref Range   Cholesterol 109 <200 mg/dL   HDL 35 (L) > OR = 50 mg/dL   Triglycerides 262 (H) <150 mg/dL   LDL Cholesterol (Calc) 43 mg/dL (calc)   Total CHOL/HDL Ratio 3.1 <5.0 (calc)   Non-HDL Cholesterol (Calc) 74 <130 mg/dL (calc)  Thyroid Panel With TSH  Result Value Ref Range   T3 Uptake 33 22 - 35 %   T4, Total 5.7 5.1 - 11.9 mcg/dL   Free Thyroxine Index 1.9 1.4 - 3.8   TSH 1.80 0.40 - 4.50 mIU/L  HgB A1c  Result Value Ref Range   Hgb A1c MFr Bld 6.3 (H) <5.7 % of total Hgb   Mean Plasma Glucose 134 (calc)   eAG (mmol/L) 7.4 (calc)  Microalbumin / creatinine urine ratio  Result Value Ref Range   Creatinine, Urine 78 20 - 275 mg/dL   Microalb, Ur 0.4 mg/dL   Microalb Creat Ratio 5 <30 mcg/mg creat  Urine Microscopic  Result Value Ref Range   WBC, UA NONE SEEN 0 - 5 /HPF   RBC / HPF NONE SEEN 0 - 2 /HPF   Squamous Epithelial / LPF NONE SEEN < OR = 5 /HPF   Bacteria, UA NONE SEEN NONE SEEN /HPF   Hyaline Cast NONE SEEN NONE SEEN /LPF  POCT Urinalysis Dipstick  Result Value Ref Range   Color, UA Yellow    Clarity, UA clear    Glucose, UA Negative Negative   Bilirubin, UA negative    Ketones, UA negative    Spec Grav, UA 1.015 1.010 - 1.025   Blood, UA trace    pH, UA 5.0 5.0 - 8.0   Protein, UA Negative Negative   Urobilinogen, UA 0.2 0.2 or 1.0 E.U./dL   Nitrite, UA negative    Leukocytes, UA Negative Negative   Appearance     Odor        Assessment & Plan:   Problem List Items Addressed This Visit      Nervous and Auditory   Eustachian tube dysfunction, right    Right sided eustachian tube dysfunction.  Has completed augmentin 1 day ago for a 10 day course for a sinus infection.  Continued popping and clicking with blowing nose or chewing.  Has used flonase and allergy medication without  relief of this symptom.  Plan: 1. Begin prednisone 40mg  daily for the next 5 days 2. RTC if symptoms worsen or no improvement with current treatment plan       Other Visit Diagnoses    Dysfunction of right eustachian tube    -  Primary   Relevant Medications   predniSONE (DELTASONE) 20 MG tablet      Meds ordered this encounter  Medications  . predniSONE (DELTASONE) 20 MG tablet    Sig: Take 2 tablets (40 mg total) by mouth daily with breakfast for 5 days.    Dispense:  10 tablet    Refill:  0      Follow up plan: Return if symptoms worsen or fail to improve.   Harlin Rain, [facility] Practition[facility] 10/26/2019, 9:34 AM

## 2019-10-26 NOTE — Telephone Encounter (Signed)
Called pt schedule appt to be seen today. 10/26/2019  KP    Copied from [facility] #[id number]. Topic: General - Call Back - No Documentation >> Oct 26, 2019  6:48 AM Lauren Cervantes wrote: Reason for CRM: PT stated that she completed antibiotics from sinus infection, stated that her Right ear is still in pain , stated she took tylenol for pain in right ear PT wants to be advised on what she should do next, states she cant hear out of it and thinks it's an ear infection

## 2019-11-20 ENCOUNTER — Encounter: Payer: Self-pay | Admitting: Family Medicine

## 2019-11-23 DIAGNOSIS — H6981 Other specified disorders of Eustachian tube, right ear: Secondary | ICD-10-CM | POA: Diagnosis not present

## 2019-11-23 DIAGNOSIS — H903 Sensorineural hearing loss, bilateral: Secondary | ICD-10-CM | POA: Diagnosis not present

## 2019-11-28 ENCOUNTER — Other Ambulatory Visit: Payer: Self-pay | Admitting: Family Medicine

## 2019-11-28 DIAGNOSIS — I10 Essential (primary) hypertension: Secondary | ICD-10-CM

## 2019-12-12 DIAGNOSIS — Z1211 Encounter for screening for malignant neoplasm of colon: Secondary | ICD-10-CM | POA: Diagnosis not present

## 2019-12-12 LAB — COLOGUARD: Cologuard: NEGATIVE

## 2019-12-24 ENCOUNTER — Other Ambulatory Visit: Payer: Self-pay | Admitting: Family Medicine

## 2019-12-24 ENCOUNTER — Encounter: Payer: Self-pay | Admitting: Family Medicine

## 2020-03-03 ENCOUNTER — Other Ambulatory Visit: Payer: Self-pay | Admitting: Family Medicine

## 2020-03-03 DIAGNOSIS — I10 Essential (primary) hypertension: Secondary | ICD-10-CM

## 2020-03-04 ENCOUNTER — Other Ambulatory Visit: Payer: Self-pay | Admitting: Family Medicine

## 2020-03-04 DIAGNOSIS — I10 Essential (primary) hypertension: Secondary | ICD-10-CM

## 2020-03-04 MED ORDER — LOSARTAN POTASSIUM 100 MG PO TABS: ORAL_TABLET | ORAL | 0 refills | Status: DC

## 2020-04-24 ENCOUNTER — Telehealth (INDEPENDENT_AMBULATORY_CARE_PROVIDER_SITE_OTHER): Payer: BC Managed Care – PPO | Admitting: Family Medicine

## 2020-04-24 ENCOUNTER — Other Ambulatory Visit: Payer: Self-pay

## 2020-04-24 ENCOUNTER — Encounter: Payer: Self-pay | Admitting: Family Medicine

## 2020-04-24 VITALS — BP 130/87 | HR 87 | Temp 98.0°F | Ht 66.0 in | Wt 165.0 lb

## 2020-04-24 DIAGNOSIS — J069 Acute upper respiratory infection, unspecified: Secondary | ICD-10-CM | POA: Diagnosis not present

## 2020-04-24 MED ORDER — PREDNISONE 50 MG PO TABS: ORAL_TABLET | ORAL | 0 refills | Status: DC

## 2020-04-24 NOTE — Assessment & Plan Note (Signed)
Likely viral URI, will treat with prednisone burst of 50mg  daily x 5 days.  Will continue OTC mucinex, dayquil and nyquil for additional symptom management.  Strict ER precautions given.

## 2020-04-24 NOTE — Progress Notes (Signed)
Virtual Visit via Telephone  The purpose of this virtual visit is to provide medical care while limiting exposure to the novel coronavirus (COVID19) for both patient and office staff.  Consent was obtained for phone visit:  Yes.   Answered questions that patient had about telehealth interaction:  Yes.   I discussed the limitations, risks, security and privacy concerns of performing an evaluation and management service by telephone. I also discussed with the patient that there may be a patient responsible charge related to this service. The patient expressed understanding and agreed to proceed.  Patient is at home and is accessed via telephone Services are provided by Harlin Rain, FNP-C from [facility])  ---------------------------------------------------------------------- Chief Complaint  Patient presents with  . Cough    Cold onset 03/20/2020 got better and then is now back before 5 days -all three test was negative for Covid denies chills or fever or SOB    S: Reviewed CMA documentation. I have called patient and gathered additional HPI as follows:  Phynix presents for virtual telemedicine visit via telephone with concerns of having a cold that started around 03/20/2020, that resolved after a few weeks, but has recently returned after being around her grandchildren for Christmas.  Reports non-productive cough.  Denies fevers, sore throat, change in taste/smell, chills, SOB, CP, DOE, abdominal pain, n/v/d.  Reports taking 3 COVID tests that were all negative.  Patient is currently home Denies any high risk travel to areas of current concern for COVID19. Denies any known or suspected exposure to person with or possibly with COVID19.  Past Medical History:  Diagnosis Date  . Breast cancer ([place]) 2010  . Hyperlipidemia   . Hypertension   . Lymphedema of arm    left arm.   Social History   Tobacco Use  . Smoking status: Current Every Day Smoker     Packs/day: 0.05    Years: 30.00    Pack years: 1.50    Types: Cigarettes  . Smokeless tobacco: Never Used  . Tobacco comment: smkes 1-3 cigs a day   Vaping Use  . Vaping Use: Former  Substance Use Topics  . Alcohol use: Yes    Alcohol/week: 2.0 standard drinks    Types: 1 Glasses of wine, 1 Standard drinks or equivalent per week    Comment: 1 a day most days  . Drug use: No    Current Outpatient Medications:  .  acyclovir (ZOVIRAX) 400 MG tablet, Take 1 tablet (400 mg total) by mouth 2 (two) times daily as needed., Disp: 30 tablet, Rfl: 1 .  anastrozole (ARIMIDEX) 1 MG tablet, Take 1 mg by mouth daily., Disp: , Rfl:  .  aspirin EC 81 MG tablet, Take by mouth., Disp: , Rfl:  .  Calcium Carb-Cholecalciferol (CALCIUM 1000 + D) 1000-800 MG-UNIT TABS, Take 1 tablet by mouth daily. Vitamin D 440m, Disp: 90 tablet, Rfl: 2 .  carvedilol (COREG) 25 MG tablet, TAKE 1 TABLET(25 MG) BY MOUTH TWICE DAILY WITH A MEAL, Disp: 180 tablet, Rfl: 3 .  ezetimibe (ZETIA) 10 MG tablet, TAKE 1 TABLET(10 MG) BY MOUTH DAILY, Disp: 90 tablet, Rfl: 2 .  icosapent Ethyl (VASCEPA) 1 g capsule, Take 2 capsules (2 g total) by mouth 2 (two) times daily., Disp: 360 capsule, Rfl: 4 .  losartan (COZAAR) 100 MG tablet, Take one tablet by mouth daily., Disp: 90 tablet, Rfl: 0 .  Multiple Vitamins-Minerals (MULTIVITAMIN WITH MINERALS) tablet, Take 1 tablet by mouth daily., Disp: ,  Rfl:  .  predniSONE (DELTASONE) 50 MG tablet, Take 1 tablet daily x 5 days, Disp: 5 tablet, Rfl: 0 .  rosuvastatin (CRESTOR) 20 MG tablet, TAKE 1 TABLET BY MOUTH AT BEDTIME, Disp: 90 tablet, Rfl: 1 .  valACYclovir (VALTREX) 1000 MG tablet, TAKE 2 TABLETS BY MOUTH ONCE AT START OF A COLD SORE. TAKE 1 TABLET DAILY UNTIL FEVER BILSTER IS GONE OR FOR TOTAL OF 5 DAYS, Disp: 20 tablet, Rfl: 1 .  blood glucose meter kit and supplies, Dispense based on patient and insurance preference. Use once daily as directed. (FOR ICD-9 250.00, 250.01)., Disp: 1 each,  Rfl: 0 .  Blood Glucose Monitoring Suppl (ONETOUCH VERIO) w/Device KIT, U UTD, Disp: , Rfl:  .  glucose blood (ONETOUCH VERIO) test strip, USE ONCE DAILY AS DIRECTED, Disp: 100 each, Rfl: 3 .  ONETOUCH DELICA LANCETS FINE MISC, USE AS DIRECTED ONCE DAILY, Disp: 100 each, Rfl: 3  Depression screen [facility] 2/9 10/26/2019 07/23/2019 06/01/2018  Decreased Interest 0 0 0  Down, Depressed, Hopeless 0 0 0  PHQ - 2 Score 0 0 0  Altered sleeping 0 - 2  Tired, decreased energy 0 - 1  Change in appetite 0 - 0  Feeling bad or failure about yourself  0 - 0  Trouble concentrating 0 - 0  Moving slowly or fidgety/restless 0 - 0  Suicidal thoughts 0 - 0  PHQ-9 Score 0 - 3  Difficult doing work/chores Not difficult at all - Not difficult at all    No flowsheet data found.  -------------------------------------------------------------------------- O: No physical exam performed due to remote telephone encounter.  Physical Exam: Patient remotely monitored without video.  Verbal communication appropriate.  Cognition normal.  No results found for this or any previous visit (from the past 2160 hour(s)).  -------------------------------------------------------------------------- A&P:  Problem List Items Addressed This Visit      Respiratory   Upper respiratory tract infection - Primary    Likely viral URI, will treat with prednisone burst of 56m daily x 5 days.  Will continue OTC mucinex, dayquil and nyquil for additional symptom management.  Strict ER precautions given.       Relevant Medications   predniSONE (DELTASONE) 50 MG tablet      Meds ordered this encounter  Medications  . predniSONE (DELTASONE) 50 MG tablet    Sig: Take 1 tablet daily x 5 days    Dispense:  5 tablet    Refill:  0    Follow-up: - Return if symptoms worsen or fail to improve   Patient verbalizes understanding with the above medical recommendations including the limitation of remote medical advice.  Specific  follow-up and call-back criteria were given for patient to follow-up or seek medical care more urgently if needed.  - Time spent in direct consultation with patient on phone: 6 minutes  NHarlin Rain [facility] 04/24/2020, 3:01 PM

## 2020-05-21 ENCOUNTER — Other Ambulatory Visit: Payer: Self-pay | Admitting: Family Medicine

## 2020-05-21 DIAGNOSIS — E782 Mixed hyperlipidemia: Secondary | ICD-10-CM

## 2020-06-04 ENCOUNTER — Other Ambulatory Visit: Payer: Self-pay

## 2020-06-04 DIAGNOSIS — E782 Mixed hyperlipidemia: Secondary | ICD-10-CM

## 2020-06-04 DIAGNOSIS — I10 Essential (primary) hypertension: Secondary | ICD-10-CM

## 2020-06-04 MED ORDER — EZETIMIBE 10 MG PO TABS: ORAL_TABLET | ORAL | 2 refills | Status: DC

## 2020-06-04 MED ORDER — LOSARTAN POTASSIUM 100 MG PO TABS: ORAL_TABLET | ORAL | 1 refills | Status: DC

## 2020-06-05 ENCOUNTER — Other Ambulatory Visit: Payer: Self-pay

## 2020-06-19 ENCOUNTER — Other Ambulatory Visit: Payer: Self-pay

## 2020-06-19 DIAGNOSIS — E782 Mixed hyperlipidemia: Secondary | ICD-10-CM

## 2020-06-19 MED ORDER — ROSUVASTATIN CALCIUM 20 MG PO TABS: 20.0000 mg | ORAL_TABLET | Freq: Every day | ORAL | 0 refills | Status: DC

## 2020-06-19 MED ORDER — CETIRIZINE HCL 10 MG PO TABS: 10.0000 mg | ORAL_TABLET | Freq: Every day | ORAL | 11 refills | Status: DC

## 2020-07-01 DIAGNOSIS — Z720 Tobacco use: Secondary | ICD-10-CM | POA: Diagnosis not present

## 2020-08-07 ENCOUNTER — Emergency Department
Admission: EM | Admit: 2020-08-07 | Discharge: 2020-08-07 | Disposition: A | Discharge status: Other Acute Inpatient Hospital | Payer: BC Managed Care – PPO | Attending: Physician Assistant | Admitting: Physician Assistant

## 2020-08-07 ENCOUNTER — Emergency Department: Payer: BC Managed Care – PPO

## 2020-08-07 ENCOUNTER — Other Ambulatory Visit: Payer: Self-pay

## 2020-08-07 DIAGNOSIS — T07XXXA Unspecified multiple injuries, initial encounter: Secondary | ICD-10-CM | POA: Diagnosis not present

## 2020-08-07 DIAGNOSIS — Z20822 Contact with and (suspected) exposure to covid-19: Secondary | ICD-10-CM | POA: Diagnosis not present

## 2020-08-07 DIAGNOSIS — R52 Pain, unspecified: Secondary | ICD-10-CM | POA: Diagnosis not present

## 2020-08-07 DIAGNOSIS — S83289A Other tear of lateral meniscus, current injury, unspecified knee, initial encounter: Secondary | ICD-10-CM | POA: Diagnosis not present

## 2020-08-07 DIAGNOSIS — Z7982 Long term (current) use of aspirin: Secondary | ICD-10-CM | POA: Diagnosis not present

## 2020-08-07 DIAGNOSIS — Z79899 Other long term (current) drug therapy: Secondary | ICD-10-CM | POA: Insufficient documentation

## 2020-08-07 DIAGNOSIS — S82102A Unspecified fracture of upper end of left tibia, initial encounter for closed fracture: Secondary | ICD-10-CM | POA: Diagnosis not present

## 2020-08-07 DIAGNOSIS — S8992XA Unspecified injury of left lower leg, initial encounter: Secondary | ICD-10-CM | POA: Diagnosis not present

## 2020-08-07 DIAGNOSIS — R531 Weakness: Secondary | ICD-10-CM | POA: Diagnosis not present

## 2020-08-07 DIAGNOSIS — Z6826 Body mass index (BMI) 26.0-26.9, adult: Secondary | ICD-10-CM | POA: Diagnosis not present

## 2020-08-07 DIAGNOSIS — Y92812 Truck as the place of occurrence of the external cause: Secondary | ICD-10-CM | POA: Insufficient documentation

## 2020-08-07 DIAGNOSIS — W19XXXA Unspecified fall, initial encounter: Secondary | ICD-10-CM

## 2020-08-07 DIAGNOSIS — S82142A Displaced bicondylar fracture of left tibia, initial encounter for closed fracture: Secondary | ICD-10-CM | POA: Diagnosis not present

## 2020-08-07 DIAGNOSIS — E785 Hyperlipidemia, unspecified: Secondary | ICD-10-CM | POA: Diagnosis not present

## 2020-08-07 DIAGNOSIS — S82252A Displaced comminuted fracture of shaft of left tibia, initial encounter for closed fracture: Secondary | ICD-10-CM | POA: Insufficient documentation

## 2020-08-07 DIAGNOSIS — Y30XXXA Falling, jumping or pushed from a high place, undetermined intent, initial encounter: Secondary | ICD-10-CM | POA: Insufficient documentation

## 2020-08-07 DIAGNOSIS — R0902 Hypoxemia: Secondary | ICD-10-CM | POA: Diagnosis not present

## 2020-08-07 DIAGNOSIS — M7989 Other specified soft tissue disorders: Secondary | ICD-10-CM | POA: Diagnosis not present

## 2020-08-07 DIAGNOSIS — I10 Essential (primary) hypertension: Secondary | ICD-10-CM | POA: Insufficient documentation

## 2020-08-07 DIAGNOSIS — E874 Mixed disorder of acid-base balance: Secondary | ICD-10-CM | POA: Diagnosis not present

## 2020-08-07 DIAGNOSIS — Z853 Personal history of malignant neoplasm of breast: Secondary | ICD-10-CM | POA: Diagnosis not present

## 2020-08-07 DIAGNOSIS — Z7409 Other reduced mobility: Secondary | ICD-10-CM | POA: Diagnosis not present

## 2020-08-07 DIAGNOSIS — Y990 Civilian activity done for income or pay: Secondary | ICD-10-CM | POA: Insufficient documentation

## 2020-08-07 DIAGNOSIS — F17210 Nicotine dependence, cigarettes, uncomplicated: Secondary | ICD-10-CM | POA: Diagnosis not present

## 2020-08-07 DIAGNOSIS — S82192A Other fracture of upper end of left tibia, initial encounter for closed fracture: Secondary | ICD-10-CM | POA: Diagnosis not present

## 2020-08-07 DIAGNOSIS — Z9010 Acquired absence of unspecified breast and nipple: Secondary | ICD-10-CM | POA: Diagnosis not present

## 2020-08-07 DIAGNOSIS — R5383 Other fatigue: Secondary | ICD-10-CM | POA: Diagnosis not present

## 2020-08-07 DIAGNOSIS — W1789XA Other fall from one level to another, initial encounter: Secondary | ICD-10-CM | POA: Diagnosis not present

## 2020-08-07 DIAGNOSIS — R110 Nausea: Secondary | ICD-10-CM | POA: Diagnosis not present

## 2020-08-07 MED ORDER — HYDROMORPHONE HCL 1 MG/ML IJ SOLN
0.5000 mg | Freq: Once | INTRAMUSCULAR | Status: AC
  Administered 2020-08-07: 0.5 mg via INTRAVENOUS
  Filled 2020-08-07: qty 1

## 2020-08-07 MED ORDER — OXYCODONE HCL 5 MG PO TABS
5.0000 mg | ORAL_TABLET | Freq: Once | ORAL | Status: AC
  Administered 2020-08-07: 5 mg via ORAL
  Filled 2020-08-07: qty 1

## 2020-08-07 MED ORDER — ACETAMINOPHEN 500 MG PO TABS
1000.0000 mg | ORAL_TABLET | Freq: Once | ORAL | Status: AC
  Administered 2020-08-07: 1000 mg via ORAL
  Filled 2020-08-07: qty 2

## 2020-08-07 MED ORDER — HYDROMORPHONE HCL 1 MG/ML IJ SOLN
0.5000 mg | Freq: Once | INTRAMUSCULAR | Status: AC
Start: 2020-08-07 — End: 2020-08-07
  Administered 2020-08-07: 0.5 mg via INTRAVENOUS
  Filled 2020-08-07: qty 1

## 2020-08-07 NOTE — ED Notes (Signed)
[facility] called for transfer, [facility] called and patient accepted ED to ED  [id number]

## 2020-08-07 NOTE — ED Triage Notes (Signed)
Pt to ED [facility] from work where she jumped off truck when it was malfunctioning. Is NOT workers comp. Landed on feet, had injury to left knee with obvious deformity noted.  Pedal pulses felt Denies loc  EMS placed 20g to Right hand and gave 21mcg fentanyl  4mg  zofran  Pt seen by Ron PA at time of triage

## 2020-08-07 NOTE — ED Notes (Signed)
Pre RN [facility] to transport patient, waiting for transport

## 2020-08-07 NOTE — ED Notes (Signed)
Report to Education administrator at [facility] ED.  Pt being transferred ED to ED.  Report to [place] [facility] as well, they ask if BLS is okay due to shortages of other trucks. This pt sees to be appropiate for that as she has no drips or IV fluids hanging and is not currently on the cardiac monitor at this time, will discuss this with MD to make sure this is okay.

## 2020-08-07 NOTE — ED Provider Notes (Signed)
[facility] Emergency Department Provider Note   ____________________________________________   Event Date/Time   First MD Initiated Contact with Patient 08/07/20 1405     (approximate)  I have reviewed the triage vital signs and the nursing notes.   HISTORY  Chief Complaint Leg Injury    HPI Lauren Cervantes is a 65 y.o. female patient complain of left knee pain and deformity secondary to jumping off a truck when it malfunction.  Patient landed on her feet but has a deformity to the proximal tib-fib.  Denies LOC.  Patient pain was controlled with fentanyl given in route by EMS.         Past Medical History:  Diagnosis Date  . Breast cancer ([place]) 2010  . Hyperlipidemia   . Hypertension   . Lymphedema of arm    left arm.    Patient Active Problem List   Diagnosis Date Noted  . Upper respiratory tract infection 04/24/2020  . Eustachian tube dysfunction, right 10/26/2019  . Sinusitis 10/15/2019  . Bacterial conjunctivitis of both eyes 10/10/2019  . Osteopenia of neck of left femur 06/01/2018  . Malignant neoplasm of overlapping sites of left breast in female, estrogen receptor positive ([place]) 06/01/2018  . Osteoporosis of lumbar spine 02/03/2017  . Hyperlipidemia 02/03/2017  . Pre-diabetes 02/03/2017  . Migraines 03/29/2016  . Low back pain 11/24/2015  . History of left breast cancer 02/07/2015  . Herpes labialis 02/07/2015  . ESSENTIAL HYPERTENSION, MALIGNANT 06/11/2009  . Sinus tachycardia 06/11/2009    Past Surgical History:  Procedure Laterality Date  . BREAST LUMPECTOMY    . TOTAL ABDOMINAL HYSTERECTOMY      Prior to Admission medications   Medication Sig Start Date End Date Taking? Authorizing Provider  acyclovir (ZOVIRAX) 400 MG tablet Take 1 tablet (400 mg total) by mouth 2 (two) times daily as needed. 07/23/19   Malfi, Lupita Raider, FNP  anastrozole (ARIMIDEX) 1 MG tablet Take 1 mg by mouth daily.    [provider]   aspirin EC 81 MG tablet Take by mouth.    [provider]  blood glucose meter kit and supplies Dispense based on patient and insurance preference. Use once daily as directed. (FOR ICD-9 250.00, 250.01). 07/23/19   Malfi, Lupita Raider, FNP  Blood Glucose Monitoring Suppl (ONETOUCH VERIO) w/Device KIT U UTD 02/04/17   [provider]  Calcium Carb-Cholecalciferol (CALCIUM 1000 + D) 1000-800 MG-UNIT TABS Take 1 tablet by mouth daily. Vitamin D $RemoveB'400mg'NmNLpsWI$  07/23/19   Malfi, Lupita Raider, FNP  carvedilol (COREG) 25 MG tablet TAKE 1 TABLET(25 MG) BY MOUTH TWICE DAILY WITH A MEAL 07/23/19   Malfi, Lupita Raider, FNP  cetirizine (ZYRTEC) 10 MG tablet Take 1 tablet (10 mg total) by mouth daily. 06/19/20   Karamalegos, Devonne Doughty, DO  ezetimibe (ZETIA) 10 MG tablet TAKE 1 TABLET(10 MG) BY MOUTH DAILY 06/04/20   Parks Ranger, Devonne Doughty, DO  glucose blood (ONETOUCH VERIO) test strip USE ONCE DAILY AS DIRECTED 07/23/19   Malfi, Lupita Raider, FNP  icosapent Ethyl (VASCEPA) 1 g capsule Take 2 capsules (2 g total) by mouth 2 (two) times daily. 07/23/19   Malfi, Lupita Raider, FNP  losartan (COZAAR) 100 MG tablet Take one tablet by mouth daily. 06/04/20   Karamalegos, Devonne Doughty, DO  Multiple Vitamins-Minerals (MULTIVITAMIN WITH MINERALS) tablet Take 1 tablet by mouth daily.    [provider]  [facility] DELICA LANCETS FINE MISC USE AS DIRECTED ONCE DAILY 05/06/17   Cassell Smiles  Renee, NP  predniSONE (DELTASONE) 50 MG tablet Take 1 tablet daily x 5 days 04/24/20   Verl Bangs, FNP  rosuvastatin (CRESTOR) 20 MG tablet Take 1 tablet (20 mg total) by mouth at bedtime. 06/19/20   Karamalegos, Devonne Doughty, DO  valACYclovir (VALTREX) 1000 MG tablet TAKE 2 TABLETS BY MOUTH ONCE AT START OF A COLD SORE. TAKE 1 TABLET DAILY UNTIL FEVER BILSTER IS GONE OR FOR TOTAL OF 5 DAYS 07/23/19   Malfi, Lupita Raider, FNP    Allergies Hydrocodone-acetaminophen  Family History  Problem Relation Age of Onset  . Hypertension Mother   .  Hypertension Father   . Stroke Father   . Cancer Other   . Coronary artery disease Other   . Stroke Other   . Hyperlipidemia Other   . Hypertension Other     Social History Social History   Tobacco Use  . Smoking status: Current Every Day Smoker    Packs/day: 0.05    Years: 30.00    Pack years: 1.50    Types: Cigarettes  . Smokeless tobacco: Never Used  . Tobacco comment: smkes 1-3 cigs a day   Vaping Use  . Vaping Use: Former  Substance Use Topics  . Alcohol use: Yes    Alcohol/week: 2.0 standard drinks    Types: 1 Glasses of wine, 1 Standard drinks or equivalent per week    Comment: 1 a day most days  . Drug use: No    Review of Systems Constitutional: No fever/chills Eyes: No visual changes. ENT: No sore throat. Cardiovascular: Denies chest pain. Respiratory: Denies shortness of breath. Gastrointestinal: No abdominal pain.  No nausea, no vomiting.  No diarrhea.  No constipation. Genitourinary: Negative for dysuria. Musculoskeletal: Left knee/lower leg pain. Skin: Negative for rash. Neurological: Negative for headaches, focal weakness or numbness. Endocrine:  Diabetes, hyperlipidemia, hypertension. Allergic/Immunilogical: Hydrocodone and acetaminophen ____________________________________________   PHYSICAL EXAM:  VITAL SIGNS: ED Triage Vitals  Enc Vitals Group     BP 08/07/20 1331 (!) 162/94     Pulse Rate 08/07/20 1331 81     Resp 08/07/20 1331 16     Temp 08/07/20 1331 97.7 F (36.5 C)     Temp Source 08/07/20 1331 Oral     SpO2 08/07/20 1331 98 %     Weight 08/07/20 1327 165 lb (74.8 kg)     Height 08/07/20 1327 $RemoveBefor'5\' 6"'VnOujxnNjwsp$  (1.676 m)     Head Circumference --      Peak Flow --      Pain Score 08/07/20 1326 5     Pain Loc --      Pain Edu? --      Excl. in [place]? --     Constitutional: Alert and oriented. Well appearing and in no acute distress. Eyes: Conjunctivae are normal. PERRL. EOMI. Head: Atraumatic. Nose: No  congestion/rhinnorhea. Mouth/Throat: Mucous membranes are moist.  Oropharynx non-erythematous NeckNo cervical spine tenderness to palpation Hematological/Lymphatic/Immunilogical: No cervical lymphadenopathy. Cardiovascular: Normal rate, regular rhythm. Grossly normal heart sounds.  Good peripheral circulation. Respiratory: Normal respiratory effort.  No retractions. Lungs CTAB. Gastrointestinal: Soft and nontender. No distention. No abdominal bruits. No CVA tenderness. Genitourinary: Deferred Musculoskeletal: No lower extremity tenderness nor edema.  No joint effusions. Neurologic:  Normal speech and language. No gross focal neurologic deficits are appreciated. No gait instability. Skin:  Skin is warm, dry and intact. No rash noted. Psychiatric: Mood and affect are normal. Speech and behavior are normal.  ____________________________________________   LABS (all labs  ordered are listed, but only abnormal results are displayed)  Labs Reviewed  SARS CORONAVIRUS 2 (TAT 6-24 HRS)   ____________________________________________  EKG   ____________________________________________  RADIOLOGY I, Sable Feil, personally viewed and evaluated these images (plain radiographs) as part of my medical decision making, as well as reviewing the written report by the radiologist.  ED MD interpretation: Left proximal tibia fracture  Official radiology report(s): DG Tibia/Fibula Left Port  Result Date: 08/07/2020 CLINICAL DATA:  Jump off malfunctioning truck with trauma to the left knee. EXAM: PORTABLE LEFT TIBIA AND FIBULA - 2 VIEW COMPARISON:  None. FINDINGS: Severely comminuted displaced impacted triplane fracture of the proximal left tibia with large intra-articular extension. The medial tibial plateau is included in a large butterfly fragment. Large suprapatellar hemorrhagic effusion. Soft tissue swelling. IMPRESSION: 1. Severely comminuted displaced impacted triplane fracture of the proximal  left tibia with large intra-articular extension. 2. Large suprapatellar hemorrhagic effusion. Electronically Signed   By: Fidela Salisbury M.D.   On: 08/07/2020 14:30    ____________________________________________   PROCEDURES  Procedure(s) performed (including Critical Care):  Procedures   ____________________________________________   INITIAL IMPRESSION / ASSESSMENT AND PLAN / ED COURSE  As part of my medical decision making, I reviewed the following data within the [place]         Patient arrived via EMS with deformity to the proximal left tibia secondary to jumping off a truck.  Discussed x-ray findings with patient shown left proximal displaced tibia fracture.  Discussed patient with on-call orthopedics (Dr. Roland Rack) who advised to contact [facility] trauma Ortho. Doctor Vladimir Crofts assume care of Patient.      ____________________________________________   FINAL CLINICAL IMPRESSION(S) / ED DIAGNOSES  Final diagnoses:  Fall  Closed fracture of proximal end of left tibia, unspecified fracture morphology, initial encounter     ED Discharge Orders    None      *Please note:  Lauren Cervantes was evaluated in Emergency Department on 08/07/2020 for the symptoms described in the history of present illness. She was evaluated in the context of the global COVID-19 pandemic, which necessitated consideration that the patient might be at risk for infection with the SARS-CoV-2 virus that causes COVID-19. Institutional protocols and algorithms that pertain to the evaluation of patients at risk for COVID-19 are in a state of rapid change based on information released by regulatory bodies including the CDC and federal and state organizations. These policies and algorithms were followed during the patient's care in the ED.  Some ED evaluations and interventions may be delayed as a result of limited staffing during and the pandemic.*   Note:  This document was  prepared using Dragon voice recognition software and may include unintentional dictation errors.    Sable Feil, PA-C 08/07/20 1549    Vladimir Crofts, MD 08/07/20 1700

## 2020-08-08 DIAGNOSIS — S82252A Displaced comminuted fracture of shaft of left tibia, initial encounter for closed fracture: Secondary | ICD-10-CM | POA: Diagnosis not present

## 2020-08-08 DIAGNOSIS — S82142A Displaced bicondylar fracture of left tibia, initial encounter for closed fracture: Secondary | ICD-10-CM | POA: Diagnosis not present

## 2020-08-08 LAB — SARS CORONAVIRUS 2 (TAT 6-24 HRS): SARS Coronavirus 2: NEGATIVE

## 2020-08-12 DIAGNOSIS — R2689 Other abnormalities of gait and mobility: Secondary | ICD-10-CM | POA: Diagnosis not present

## 2020-08-12 DIAGNOSIS — F17200 Nicotine dependence, unspecified, uncomplicated: Secondary | ICD-10-CM | POA: Diagnosis not present

## 2020-08-12 DIAGNOSIS — S82142A Displaced bicondylar fracture of left tibia, initial encounter for closed fracture: Secondary | ICD-10-CM | POA: Diagnosis not present

## 2020-08-12 DIAGNOSIS — R52 Pain, unspecified: Secondary | ICD-10-CM | POA: Diagnosis not present

## 2020-08-12 DIAGNOSIS — F17210 Nicotine dependence, cigarettes, uncomplicated: Secondary | ICD-10-CM | POA: Diagnosis not present

## 2020-08-13 DIAGNOSIS — S82142A Displaced bicondylar fracture of left tibia, initial encounter for closed fracture: Secondary | ICD-10-CM | POA: Diagnosis not present

## 2020-08-13 DIAGNOSIS — G8918 Other acute postprocedural pain: Secondary | ICD-10-CM | POA: Diagnosis not present

## 2020-08-13 DIAGNOSIS — S83282A Other tear of lateral meniscus, current injury, left knee, initial encounter: Secondary | ICD-10-CM | POA: Diagnosis not present

## 2020-08-14 DIAGNOSIS — M79605 Pain in left leg: Secondary | ICD-10-CM | POA: Diagnosis not present

## 2020-08-14 DIAGNOSIS — S82142A Displaced bicondylar fracture of left tibia, initial encounter for closed fracture: Secondary | ICD-10-CM | POA: Diagnosis not present

## 2020-08-14 DIAGNOSIS — G8918 Other acute postprocedural pain: Secondary | ICD-10-CM | POA: Diagnosis not present

## 2020-08-15 DIAGNOSIS — R2689 Other abnormalities of gait and mobility: Secondary | ICD-10-CM | POA: Diagnosis not present

## 2020-08-15 DIAGNOSIS — S83422D Sprain of lateral collateral ligament of left knee, subsequent encounter: Secondary | ICD-10-CM | POA: Diagnosis not present

## 2020-08-15 DIAGNOSIS — S82132D Displaced fracture of medial condyle of left tibia, subsequent encounter for closed fracture with routine healing: Secondary | ICD-10-CM | POA: Diagnosis not present

## 2020-08-15 DIAGNOSIS — Z9889 Other specified postprocedural states: Secondary | ICD-10-CM | POA: Diagnosis not present

## 2020-08-17 DIAGNOSIS — S82102A Unspecified fracture of upper end of left tibia, initial encounter for closed fracture: Secondary | ICD-10-CM | POA: Diagnosis not present

## 2020-08-19 DIAGNOSIS — F17210 Nicotine dependence, cigarettes, uncomplicated: Secondary | ICD-10-CM | POA: Diagnosis not present

## 2020-08-19 DIAGNOSIS — W19XXXD Unspecified fall, subsequent encounter: Secondary | ICD-10-CM | POA: Diagnosis not present

## 2020-08-19 DIAGNOSIS — I10 Essential (primary) hypertension: Secondary | ICD-10-CM | POA: Diagnosis not present

## 2020-08-19 DIAGNOSIS — Z7982 Long term (current) use of aspirin: Secondary | ICD-10-CM | POA: Diagnosis not present

## 2020-08-19 DIAGNOSIS — S82142D Displaced bicondylar fracture of left tibia, subsequent encounter for closed fracture with routine healing: Secondary | ICD-10-CM | POA: Diagnosis not present

## 2020-08-19 DIAGNOSIS — Z853 Personal history of malignant neoplasm of breast: Secondary | ICD-10-CM | POA: Diagnosis not present

## 2020-08-19 DIAGNOSIS — Z7901 Long term (current) use of anticoagulants: Secondary | ICD-10-CM | POA: Diagnosis not present

## 2020-09-08 DIAGNOSIS — I10 Essential (primary) hypertension: Secondary | ICD-10-CM | POA: Diagnosis not present

## 2020-09-08 DIAGNOSIS — F17210 Nicotine dependence, cigarettes, uncomplicated: Secondary | ICD-10-CM | POA: Diagnosis not present

## 2020-09-08 DIAGNOSIS — Z7901 Long term (current) use of anticoagulants: Secondary | ICD-10-CM | POA: Diagnosis not present

## 2020-09-08 DIAGNOSIS — S82142D Displaced bicondylar fracture of left tibia, subsequent encounter for closed fracture with routine healing: Secondary | ICD-10-CM | POA: Diagnosis not present

## 2020-09-12 ENCOUNTER — Other Ambulatory Visit: Payer: Self-pay | Admitting: Family Medicine

## 2020-09-12 DIAGNOSIS — I10 Essential (primary) hypertension: Secondary | ICD-10-CM

## 2020-09-12 NOTE — Telephone Encounter (Signed)
Medication Refill - Medication:   carvedilol (COREG) 25 MG tablet  Has the patient contacted their pharmacy? Yes.   contact pcp office. Requested 90 day supply.  Preferred Pharmacy (with phone number or street name):   [id number] - [address], [address]  [place] [address]  Phone: [phone] Fax: [phone]   Agent: Please be advised that RX refills may take up to 3 business days. We ask that you follow-up with your pharmacy.

## 2020-09-12 NOTE — Telephone Encounter (Signed)
Patient called and advised she is due for an appointment to establish with a new provider that we have a refill request. She says she had leg surgery due to a fracture and at this time she is not mobile enough to come in the office. She says she's w/c bound and uses a walker, not able to bear weight on the operative leg. She says she just received a 90 day supply on her other medications and asked if the Coreg and her Boniva be refilled for a 90 day supply. I advised I will send this to Dr. Parks Ranger for approval.

## 2020-09-12 NOTE — Telephone Encounter (Signed)
Requested medication (s) are due for refill today: Yes  Requested medication (s) are on the active medication list: Yes  Last refill:  1 and 3 years ago  Future visit scheduled: No  Notes to clinic:  Unable to refill per protocol, Rx expired. See attached notes as to the reason why unable to come to the office.      Requested Prescriptions  Pending Prescriptions Disp Refills   carvedilol (COREG) 25 MG tablet 180 tablet 3    Sig: TAKE 1 TABLET(25 MG) BY MOUTH TWICE DAILY WITH A MEAL      Cardiovascular:  Beta Blockers Failed - 09/12/2020 12:19 PM      Failed - Last BP in normal range    BP Readings from Last 1 Encounters:  08/07/20 (!) 167/90          Failed - Valid encounter within last 6 months    Recent Outpatient Visits           4 months ago Upper respiratory tract infection, unspecified type   [facility], Lupita Raider, FNP   10 months ago Dysfunction of right eustachian tube   [facility], Lupita Raider, FNP   11 months ago Acute non-recurrent maxillary sinusitis   [facility], Lupita Raider, FNP   11 months ago Bacterial conjunctivitis of both eyes   [place], FNP   1 year ago Essential hypertension, malignant   [facility], Lupita Raider, [place]                Passed - Last Heart Rate in normal range    Pulse Readings from Last 1 Encounters:  08/07/20 85            ibandronate (BONIVA) 150 MG tablet  6      There is no refill protocol information for this order

## 2020-09-15 MED ORDER — IBANDRONATE SODIUM 150 MG PO TABS: 150.0000 mg | ORAL_TABLET | ORAL | 0 refills | Status: DC

## 2020-09-15 MED ORDER — CARVEDILOL 25 MG PO TABS: ORAL_TABLET | ORAL | 0 refills | Status: DC

## 2020-09-16 DIAGNOSIS — S82102A Unspecified fracture of upper end of left tibia, initial encounter for closed fracture: Secondary | ICD-10-CM | POA: Diagnosis not present

## 2020-09-23 DIAGNOSIS — S82142A Displaced bicondylar fracture of left tibia, initial encounter for closed fracture: Secondary | ICD-10-CM | POA: Diagnosis not present

## 2020-09-23 DIAGNOSIS — X58XXXA Exposure to other specified factors, initial encounter: Secondary | ICD-10-CM | POA: Diagnosis not present

## 2020-09-24 ENCOUNTER — Other Ambulatory Visit: Payer: Self-pay | Admitting: Family Medicine

## 2020-09-24 DIAGNOSIS — E781 Pure hyperglyceridemia: Secondary | ICD-10-CM

## 2020-09-24 NOTE — Telephone Encounter (Signed)
Medication Refill - Medication: icosapent Ethyl (VASCEPA) 1 g capsule     Preferred Pharmacy (with phone number or street name): [facility] [place], [place]: Please be advised that RX refills may take up to 3 business days. We ask that you follow-up with your pharmacy.

## 2020-09-24 NOTE — Telephone Encounter (Signed)
Requested medication (s) are due for refill today: yes  Requested medication (s) are on the active medication list: yes  Last refill:  07/23/19  Future visit scheduled: no  Notes to clinic:  no assigned protocol    Requested Prescriptions  Pending Prescriptions Disp Refills   icosapent Ethyl (VASCEPA) 1 g capsule 360 capsule 4    Sig: Take 2 capsules (2 g total) by mouth 2 (two) times daily.      Off-Protocol Failed - 09/24/2020 11:19 AM      Failed - Medication not assigned to a protocol, review manually.      Passed - Valid encounter within last 12 months    Recent Outpatient Visits           5 months ago Upper respiratory tract infection, unspecified type   [facility], Lupita Raider, FNP   11 months ago Dysfunction of right eustachian tube   [place], FNP   11 months ago Acute non-recurrent maxillary sinusitis   [facility], Lupita Raider, FNP   11 months ago Bacterial conjunctivitis of both eyes   [place], FNP   1 year ago Essential hypertension, malignant   [facility], Lupita Raider, [place]

## 2020-09-25 DIAGNOSIS — F17210 Nicotine dependence, cigarettes, uncomplicated: Secondary | ICD-10-CM | POA: Diagnosis not present

## 2020-09-25 DIAGNOSIS — Z853 Personal history of malignant neoplasm of breast: Secondary | ICD-10-CM | POA: Diagnosis not present

## 2020-09-25 DIAGNOSIS — Z7901 Long term (current) use of anticoagulants: Secondary | ICD-10-CM | POA: Diagnosis not present

## 2020-09-25 DIAGNOSIS — S82142D Displaced bicondylar fracture of left tibia, subsequent encounter for closed fracture with routine healing: Secondary | ICD-10-CM | POA: Diagnosis not present

## 2020-09-25 DIAGNOSIS — Z7982 Long term (current) use of aspirin: Secondary | ICD-10-CM | POA: Diagnosis not present

## 2020-09-25 DIAGNOSIS — I10 Essential (primary) hypertension: Secondary | ICD-10-CM | POA: Diagnosis not present

## 2020-09-25 DIAGNOSIS — W19XXXD Unspecified fall, subsequent encounter: Secondary | ICD-10-CM | POA: Diagnosis not present

## 2020-09-26 ENCOUNTER — Other Ambulatory Visit: Payer: Self-pay

## 2020-09-26 DIAGNOSIS — Z7901 Long term (current) use of anticoagulants: Secondary | ICD-10-CM | POA: Diagnosis not present

## 2020-09-26 DIAGNOSIS — W19XXXD Unspecified fall, subsequent encounter: Secondary | ICD-10-CM | POA: Diagnosis not present

## 2020-09-26 DIAGNOSIS — Z853 Personal history of malignant neoplasm of breast: Secondary | ICD-10-CM | POA: Diagnosis not present

## 2020-09-26 DIAGNOSIS — S82142D Displaced bicondylar fracture of left tibia, subsequent encounter for closed fracture with routine healing: Secondary | ICD-10-CM | POA: Diagnosis not present

## 2020-09-26 DIAGNOSIS — I10 Essential (primary) hypertension: Secondary | ICD-10-CM | POA: Diagnosis not present

## 2020-09-26 DIAGNOSIS — F17210 Nicotine dependence, cigarettes, uncomplicated: Secondary | ICD-10-CM | POA: Diagnosis not present

## 2020-09-26 DIAGNOSIS — Z7982 Long term (current) use of aspirin: Secondary | ICD-10-CM | POA: Diagnosis not present

## 2020-09-26 DIAGNOSIS — E781 Pure hyperglyceridemia: Secondary | ICD-10-CM

## 2020-09-26 MED ORDER — ICOSAPENT ETHYL 1 G PO CAPS: 2.0000 g | ORAL_CAPSULE | Freq: Two times a day (BID) | ORAL | 4 refills | Status: DC

## 2020-09-29 DIAGNOSIS — Z7982 Long term (current) use of aspirin: Secondary | ICD-10-CM | POA: Diagnosis not present

## 2020-09-29 DIAGNOSIS — S82142D Displaced bicondylar fracture of left tibia, subsequent encounter for closed fracture with routine healing: Secondary | ICD-10-CM | POA: Diagnosis not present

## 2020-09-29 DIAGNOSIS — I10 Essential (primary) hypertension: Secondary | ICD-10-CM | POA: Diagnosis not present

## 2020-09-29 DIAGNOSIS — W19XXXD Unspecified fall, subsequent encounter: Secondary | ICD-10-CM | POA: Diagnosis not present

## 2020-09-29 DIAGNOSIS — F17210 Nicotine dependence, cigarettes, uncomplicated: Secondary | ICD-10-CM | POA: Diagnosis not present

## 2020-09-29 DIAGNOSIS — Z7901 Long term (current) use of anticoagulants: Secondary | ICD-10-CM | POA: Diagnosis not present

## 2020-09-29 DIAGNOSIS — Z853 Personal history of malignant neoplasm of breast: Secondary | ICD-10-CM | POA: Diagnosis not present

## 2020-10-02 DIAGNOSIS — I10 Essential (primary) hypertension: Secondary | ICD-10-CM | POA: Diagnosis not present

## 2020-10-02 DIAGNOSIS — S82142D Displaced bicondylar fracture of left tibia, subsequent encounter for closed fracture with routine healing: Secondary | ICD-10-CM | POA: Diagnosis not present

## 2020-10-02 DIAGNOSIS — F17210 Nicotine dependence, cigarettes, uncomplicated: Secondary | ICD-10-CM | POA: Diagnosis not present

## 2020-10-02 DIAGNOSIS — Z7901 Long term (current) use of anticoagulants: Secondary | ICD-10-CM | POA: Diagnosis not present

## 2020-10-02 DIAGNOSIS — Z7982 Long term (current) use of aspirin: Secondary | ICD-10-CM | POA: Diagnosis not present

## 2020-10-02 DIAGNOSIS — Z853 Personal history of malignant neoplasm of breast: Secondary | ICD-10-CM | POA: Diagnosis not present

## 2020-10-02 DIAGNOSIS — W19XXXD Unspecified fall, subsequent encounter: Secondary | ICD-10-CM | POA: Diagnosis not present

## 2020-10-06 DIAGNOSIS — F17210 Nicotine dependence, cigarettes, uncomplicated: Secondary | ICD-10-CM | POA: Diagnosis not present

## 2020-10-06 DIAGNOSIS — I10 Essential (primary) hypertension: Secondary | ICD-10-CM | POA: Diagnosis not present

## 2020-10-06 DIAGNOSIS — W19XXXD Unspecified fall, subsequent encounter: Secondary | ICD-10-CM | POA: Diagnosis not present

## 2020-10-06 DIAGNOSIS — Z853 Personal history of malignant neoplasm of breast: Secondary | ICD-10-CM | POA: Diagnosis not present

## 2020-10-06 DIAGNOSIS — Z7901 Long term (current) use of anticoagulants: Secondary | ICD-10-CM | POA: Diagnosis not present

## 2020-10-06 DIAGNOSIS — S82142D Displaced bicondylar fracture of left tibia, subsequent encounter for closed fracture with routine healing: Secondary | ICD-10-CM | POA: Diagnosis not present

## 2020-10-06 DIAGNOSIS — Z7982 Long term (current) use of aspirin: Secondary | ICD-10-CM | POA: Diagnosis not present

## 2020-10-09 DIAGNOSIS — Z7982 Long term (current) use of aspirin: Secondary | ICD-10-CM | POA: Diagnosis not present

## 2020-10-09 DIAGNOSIS — Z853 Personal history of malignant neoplasm of breast: Secondary | ICD-10-CM | POA: Diagnosis not present

## 2020-10-09 DIAGNOSIS — S82142D Displaced bicondylar fracture of left tibia, subsequent encounter for closed fracture with routine healing: Secondary | ICD-10-CM | POA: Diagnosis not present

## 2020-10-09 DIAGNOSIS — W19XXXD Unspecified fall, subsequent encounter: Secondary | ICD-10-CM | POA: Diagnosis not present

## 2020-10-09 DIAGNOSIS — Z7901 Long term (current) use of anticoagulants: Secondary | ICD-10-CM | POA: Diagnosis not present

## 2020-10-09 DIAGNOSIS — I10 Essential (primary) hypertension: Secondary | ICD-10-CM | POA: Diagnosis not present

## 2020-10-09 DIAGNOSIS — F17210 Nicotine dependence, cigarettes, uncomplicated: Secondary | ICD-10-CM | POA: Diagnosis not present

## 2020-10-13 DIAGNOSIS — Z853 Personal history of malignant neoplasm of breast: Secondary | ICD-10-CM | POA: Diagnosis not present

## 2020-10-13 DIAGNOSIS — S82142D Displaced bicondylar fracture of left tibia, subsequent encounter for closed fracture with routine healing: Secondary | ICD-10-CM | POA: Diagnosis not present

## 2020-10-13 DIAGNOSIS — F17210 Nicotine dependence, cigarettes, uncomplicated: Secondary | ICD-10-CM | POA: Diagnosis not present

## 2020-10-13 DIAGNOSIS — Z7982 Long term (current) use of aspirin: Secondary | ICD-10-CM | POA: Diagnosis not present

## 2020-10-13 DIAGNOSIS — Z7901 Long term (current) use of anticoagulants: Secondary | ICD-10-CM | POA: Diagnosis not present

## 2020-10-13 DIAGNOSIS — I10 Essential (primary) hypertension: Secondary | ICD-10-CM | POA: Diagnosis not present

## 2020-10-13 DIAGNOSIS — W19XXXD Unspecified fall, subsequent encounter: Secondary | ICD-10-CM | POA: Diagnosis not present

## 2020-10-16 DIAGNOSIS — I10 Essential (primary) hypertension: Secondary | ICD-10-CM | POA: Diagnosis not present

## 2020-10-16 DIAGNOSIS — Z7982 Long term (current) use of aspirin: Secondary | ICD-10-CM | POA: Diagnosis not present

## 2020-10-16 DIAGNOSIS — S82142D Displaced bicondylar fracture of left tibia, subsequent encounter for closed fracture with routine healing: Secondary | ICD-10-CM | POA: Diagnosis not present

## 2020-10-16 DIAGNOSIS — Z853 Personal history of malignant neoplasm of breast: Secondary | ICD-10-CM | POA: Diagnosis not present

## 2020-10-16 DIAGNOSIS — Z7901 Long term (current) use of anticoagulants: Secondary | ICD-10-CM | POA: Diagnosis not present

## 2020-10-16 DIAGNOSIS — F17210 Nicotine dependence, cigarettes, uncomplicated: Secondary | ICD-10-CM | POA: Diagnosis not present

## 2020-10-16 DIAGNOSIS — W19XXXD Unspecified fall, subsequent encounter: Secondary | ICD-10-CM | POA: Diagnosis not present

## 2020-10-17 DIAGNOSIS — S82102A Unspecified fracture of upper end of left tibia, initial encounter for closed fracture: Secondary | ICD-10-CM | POA: Diagnosis not present

## 2020-10-21 DIAGNOSIS — X58XXXD Exposure to other specified factors, subsequent encounter: Secondary | ICD-10-CM | POA: Diagnosis not present

## 2020-10-21 DIAGNOSIS — S82252D Displaced comminuted fracture of shaft of left tibia, subsequent encounter for closed fracture with routine healing: Secondary | ICD-10-CM | POA: Diagnosis not present

## 2020-10-21 DIAGNOSIS — S82142A Displaced bicondylar fracture of left tibia, initial encounter for closed fracture: Secondary | ICD-10-CM | POA: Diagnosis not present

## 2020-10-21 DIAGNOSIS — S82142D Displaced bicondylar fracture of left tibia, subsequent encounter for closed fracture with routine healing: Secondary | ICD-10-CM | POA: Diagnosis not present

## 2020-10-30 DIAGNOSIS — S82142D Displaced bicondylar fracture of left tibia, subsequent encounter for closed fracture with routine healing: Secondary | ICD-10-CM | POA: Diagnosis not present

## 2020-10-30 DIAGNOSIS — F17210 Nicotine dependence, cigarettes, uncomplicated: Secondary | ICD-10-CM | POA: Diagnosis not present

## 2020-10-30 DIAGNOSIS — Z853 Personal history of malignant neoplasm of breast: Secondary | ICD-10-CM | POA: Diagnosis not present

## 2020-10-30 DIAGNOSIS — W19XXXD Unspecified fall, subsequent encounter: Secondary | ICD-10-CM | POA: Diagnosis not present

## 2020-10-30 DIAGNOSIS — I10 Essential (primary) hypertension: Secondary | ICD-10-CM | POA: Diagnosis not present

## 2020-10-30 DIAGNOSIS — Z7982 Long term (current) use of aspirin: Secondary | ICD-10-CM | POA: Diagnosis not present

## 2020-11-03 DIAGNOSIS — I10 Essential (primary) hypertension: Secondary | ICD-10-CM | POA: Diagnosis not present

## 2020-11-03 DIAGNOSIS — W19XXXD Unspecified fall, subsequent encounter: Secondary | ICD-10-CM | POA: Diagnosis not present

## 2020-11-03 DIAGNOSIS — S82142D Displaced bicondylar fracture of left tibia, subsequent encounter for closed fracture with routine healing: Secondary | ICD-10-CM | POA: Diagnosis not present

## 2020-11-03 DIAGNOSIS — Z7982 Long term (current) use of aspirin: Secondary | ICD-10-CM | POA: Diagnosis not present

## 2020-11-03 DIAGNOSIS — F17210 Nicotine dependence, cigarettes, uncomplicated: Secondary | ICD-10-CM | POA: Diagnosis not present

## 2020-11-03 DIAGNOSIS — Z853 Personal history of malignant neoplasm of breast: Secondary | ICD-10-CM | POA: Diagnosis not present

## 2020-11-06 DIAGNOSIS — S82142D Displaced bicondylar fracture of left tibia, subsequent encounter for closed fracture with routine healing: Secondary | ICD-10-CM | POA: Diagnosis not present

## 2020-11-06 DIAGNOSIS — Z853 Personal history of malignant neoplasm of breast: Secondary | ICD-10-CM | POA: Diagnosis not present

## 2020-11-06 DIAGNOSIS — F17210 Nicotine dependence, cigarettes, uncomplicated: Secondary | ICD-10-CM | POA: Diagnosis not present

## 2020-11-06 DIAGNOSIS — W19XXXD Unspecified fall, subsequent encounter: Secondary | ICD-10-CM | POA: Diagnosis not present

## 2020-11-06 DIAGNOSIS — Z7982 Long term (current) use of aspirin: Secondary | ICD-10-CM | POA: Diagnosis not present

## 2020-11-06 DIAGNOSIS — I10 Essential (primary) hypertension: Secondary | ICD-10-CM | POA: Diagnosis not present

## 2020-11-10 DIAGNOSIS — Z7982 Long term (current) use of aspirin: Secondary | ICD-10-CM | POA: Diagnosis not present

## 2020-11-10 DIAGNOSIS — F17210 Nicotine dependence, cigarettes, uncomplicated: Secondary | ICD-10-CM | POA: Diagnosis not present

## 2020-11-10 DIAGNOSIS — I10 Essential (primary) hypertension: Secondary | ICD-10-CM | POA: Diagnosis not present

## 2020-11-10 DIAGNOSIS — W19XXXD Unspecified fall, subsequent encounter: Secondary | ICD-10-CM | POA: Diagnosis not present

## 2020-11-10 DIAGNOSIS — S82142D Displaced bicondylar fracture of left tibia, subsequent encounter for closed fracture with routine healing: Secondary | ICD-10-CM | POA: Diagnosis not present

## 2020-11-10 DIAGNOSIS — Z853 Personal history of malignant neoplasm of breast: Secondary | ICD-10-CM | POA: Diagnosis not present

## 2020-11-13 DIAGNOSIS — Z7982 Long term (current) use of aspirin: Secondary | ICD-10-CM | POA: Diagnosis not present

## 2020-11-13 DIAGNOSIS — I10 Essential (primary) hypertension: Secondary | ICD-10-CM | POA: Diagnosis not present

## 2020-11-13 DIAGNOSIS — F17210 Nicotine dependence, cigarettes, uncomplicated: Secondary | ICD-10-CM | POA: Diagnosis not present

## 2020-11-13 DIAGNOSIS — Z853 Personal history of malignant neoplasm of breast: Secondary | ICD-10-CM | POA: Diagnosis not present

## 2020-11-13 DIAGNOSIS — S82142D Displaced bicondylar fracture of left tibia, subsequent encounter for closed fracture with routine healing: Secondary | ICD-10-CM | POA: Diagnosis not present

## 2020-11-13 DIAGNOSIS — W19XXXD Unspecified fall, subsequent encounter: Secondary | ICD-10-CM | POA: Diagnosis not present

## 2020-11-16 DIAGNOSIS — S82102A Unspecified fracture of upper end of left tibia, initial encounter for closed fracture: Secondary | ICD-10-CM | POA: Diagnosis not present

## 2020-11-18 DIAGNOSIS — W19XXXD Unspecified fall, subsequent encounter: Secondary | ICD-10-CM | POA: Diagnosis not present

## 2020-11-18 DIAGNOSIS — Z7982 Long term (current) use of aspirin: Secondary | ICD-10-CM | POA: Diagnosis not present

## 2020-11-18 DIAGNOSIS — F17210 Nicotine dependence, cigarettes, uncomplicated: Secondary | ICD-10-CM | POA: Diagnosis not present

## 2020-11-18 DIAGNOSIS — I10 Essential (primary) hypertension: Secondary | ICD-10-CM | POA: Diagnosis not present

## 2020-11-18 DIAGNOSIS — Z853 Personal history of malignant neoplasm of breast: Secondary | ICD-10-CM | POA: Diagnosis not present

## 2020-11-18 DIAGNOSIS — S82142D Displaced bicondylar fracture of left tibia, subsequent encounter for closed fracture with routine healing: Secondary | ICD-10-CM | POA: Diagnosis not present

## 2020-11-20 DIAGNOSIS — S82142D Displaced bicondylar fracture of left tibia, subsequent encounter for closed fracture with routine healing: Secondary | ICD-10-CM | POA: Diagnosis not present

## 2020-11-20 DIAGNOSIS — Z853 Personal history of malignant neoplasm of breast: Secondary | ICD-10-CM | POA: Diagnosis not present

## 2020-11-20 DIAGNOSIS — F17210 Nicotine dependence, cigarettes, uncomplicated: Secondary | ICD-10-CM | POA: Diagnosis not present

## 2020-11-20 DIAGNOSIS — W19XXXD Unspecified fall, subsequent encounter: Secondary | ICD-10-CM | POA: Diagnosis not present

## 2020-11-20 DIAGNOSIS — Z7982 Long term (current) use of aspirin: Secondary | ICD-10-CM | POA: Diagnosis not present

## 2020-11-20 DIAGNOSIS — I10 Essential (primary) hypertension: Secondary | ICD-10-CM | POA: Diagnosis not present

## 2020-11-24 DIAGNOSIS — F17210 Nicotine dependence, cigarettes, uncomplicated: Secondary | ICD-10-CM | POA: Diagnosis not present

## 2020-11-24 DIAGNOSIS — W19XXXD Unspecified fall, subsequent encounter: Secondary | ICD-10-CM | POA: Diagnosis not present

## 2020-11-24 DIAGNOSIS — I10 Essential (primary) hypertension: Secondary | ICD-10-CM | POA: Diagnosis not present

## 2020-11-24 DIAGNOSIS — Z853 Personal history of malignant neoplasm of breast: Secondary | ICD-10-CM | POA: Diagnosis not present

## 2020-11-24 DIAGNOSIS — Z7982 Long term (current) use of aspirin: Secondary | ICD-10-CM | POA: Diagnosis not present

## 2020-11-24 DIAGNOSIS — S82142D Displaced bicondylar fracture of left tibia, subsequent encounter for closed fracture with routine healing: Secondary | ICD-10-CM | POA: Diagnosis not present

## 2020-11-27 DIAGNOSIS — I10 Essential (primary) hypertension: Secondary | ICD-10-CM | POA: Diagnosis not present

## 2020-11-27 DIAGNOSIS — S82142D Displaced bicondylar fracture of left tibia, subsequent encounter for closed fracture with routine healing: Secondary | ICD-10-CM | POA: Diagnosis not present

## 2020-11-27 DIAGNOSIS — Z7982 Long term (current) use of aspirin: Secondary | ICD-10-CM | POA: Diagnosis not present

## 2020-11-27 DIAGNOSIS — F17210 Nicotine dependence, cigarettes, uncomplicated: Secondary | ICD-10-CM | POA: Diagnosis not present

## 2020-11-27 DIAGNOSIS — Z853 Personal history of malignant neoplasm of breast: Secondary | ICD-10-CM | POA: Diagnosis not present

## 2020-11-27 DIAGNOSIS — W19XXXD Unspecified fall, subsequent encounter: Secondary | ICD-10-CM | POA: Diagnosis not present

## 2020-12-01 ENCOUNTER — Other Ambulatory Visit: Payer: Self-pay | Admitting: Internal Medicine

## 2020-12-01 ENCOUNTER — Telehealth: Payer: Self-pay | Admitting: Family Medicine

## 2020-12-01 DIAGNOSIS — E782 Mixed hyperlipidemia: Secondary | ICD-10-CM

## 2020-12-01 DIAGNOSIS — F17210 Nicotine dependence, cigarettes, uncomplicated: Secondary | ICD-10-CM | POA: Diagnosis not present

## 2020-12-01 DIAGNOSIS — Z853 Personal history of malignant neoplasm of breast: Secondary | ICD-10-CM | POA: Diagnosis not present

## 2020-12-01 DIAGNOSIS — Z7982 Long term (current) use of aspirin: Secondary | ICD-10-CM | POA: Diagnosis not present

## 2020-12-01 DIAGNOSIS — I10 Essential (primary) hypertension: Secondary | ICD-10-CM | POA: Diagnosis not present

## 2020-12-01 DIAGNOSIS — S82142D Displaced bicondylar fracture of left tibia, subsequent encounter for closed fracture with routine healing: Secondary | ICD-10-CM | POA: Diagnosis not present

## 2020-12-01 DIAGNOSIS — W19XXXD Unspecified fall, subsequent encounter: Secondary | ICD-10-CM | POA: Diagnosis not present

## 2020-12-02 NOTE — Telephone Encounter (Signed)
Requested medications are due for refill today yes  Requested medications are on the active medication list yes  Last refill 5/23  Last visit 04/24/20  Future visit scheduled no  Notes to clinic Failed protocol due to no valid visit within 6  months, no upcoming visit scheduled.

## 2020-12-02 NOTE — Telephone Encounter (Signed)
Requested medications are due for refill today yes  Requested medications are on the active medication list yes  Last refill 5/18  Last visit 04/25/2020  Future visit scheduled no  Notes to clinic Failed protocol due to no valid visit within 12  months, labs are over 38 days old, no upcoming visit scheduled.

## 2020-12-03 DIAGNOSIS — Z7982 Long term (current) use of aspirin: Secondary | ICD-10-CM | POA: Diagnosis not present

## 2020-12-03 DIAGNOSIS — F17210 Nicotine dependence, cigarettes, uncomplicated: Secondary | ICD-10-CM | POA: Diagnosis not present

## 2020-12-03 DIAGNOSIS — I10 Essential (primary) hypertension: Secondary | ICD-10-CM | POA: Diagnosis not present

## 2020-12-03 DIAGNOSIS — S82142D Displaced bicondylar fracture of left tibia, subsequent encounter for closed fracture with routine healing: Secondary | ICD-10-CM | POA: Diagnosis not present

## 2020-12-04 DIAGNOSIS — F17210 Nicotine dependence, cigarettes, uncomplicated: Secondary | ICD-10-CM | POA: Diagnosis not present

## 2020-12-04 DIAGNOSIS — I10 Essential (primary) hypertension: Secondary | ICD-10-CM | POA: Diagnosis not present

## 2020-12-04 DIAGNOSIS — S82142D Displaced bicondylar fracture of left tibia, subsequent encounter for closed fracture with routine healing: Secondary | ICD-10-CM | POA: Diagnosis not present

## 2020-12-04 DIAGNOSIS — W19XXXD Unspecified fall, subsequent encounter: Secondary | ICD-10-CM | POA: Diagnosis not present

## 2020-12-04 DIAGNOSIS — Z853 Personal history of malignant neoplasm of breast: Secondary | ICD-10-CM | POA: Diagnosis not present

## 2020-12-04 DIAGNOSIS — Z7982 Long term (current) use of aspirin: Secondary | ICD-10-CM | POA: Diagnosis not present

## 2020-12-16 DIAGNOSIS — S82142D Displaced bicondylar fracture of left tibia, subsequent encounter for closed fracture with routine healing: Secondary | ICD-10-CM | POA: Diagnosis not present

## 2020-12-16 DIAGNOSIS — M94262 Chondromalacia, left knee: Secondary | ICD-10-CM | POA: Diagnosis not present

## 2020-12-16 DIAGNOSIS — X58XXXA Exposure to other specified factors, initial encounter: Secondary | ICD-10-CM | POA: Diagnosis not present

## 2020-12-16 DIAGNOSIS — S82252D Displaced comminuted fracture of shaft of left tibia, subsequent encounter for closed fracture with routine healing: Secondary | ICD-10-CM | POA: Diagnosis not present

## 2020-12-16 DIAGNOSIS — S82142A Displaced bicondylar fracture of left tibia, initial encounter for closed fracture: Secondary | ICD-10-CM | POA: Diagnosis not present

## 2020-12-17 DIAGNOSIS — S82102A Unspecified fracture of upper end of left tibia, initial encounter for closed fracture: Secondary | ICD-10-CM | POA: Diagnosis not present

## 2020-12-23 NOTE — Telephone Encounter (Signed)
This is actually my patient and not Dr. Parks Ranger.  Refill denied.  She has to be seen.

## 2020-12-23 NOTE — Telephone Encounter (Signed)
Pt called saying she needs refill on her blood pressure meds.  She says she knows she needs an appt but she said she can not walk reight now because of a leg injury.  I offered her a virtual appt.  She said her insurance will not pay for that.  She said PT takes her blood pressure 2 times a week and that is good.  CB#  [phone]

## 2020-12-26 NOTE — Telephone Encounter (Signed)
Lauren Cervantes is off on Tuesday. That is the only reason she is scheduled with Dr. Raliegh Ip. No refills will be sent until that appt.

## 2020-12-26 NOTE — Telephone Encounter (Signed)
Patient called in scheduled a virtual visit for Tuesday morning but demands that Dr Raliegh Ip fill her medication for 30 days since she is all out and say that she will talk to Dr Raliegh Ip on Tuesday morning. Please advise

## 2020-12-26 NOTE — Telephone Encounter (Signed)
Lauren Cervantes noted three days ago that she is her patient. She denied refills until she is seen in office.

## 2020-12-30 ENCOUNTER — Ambulatory Visit: Payer: BC Managed Care – PPO | Admitting: Family Medicine

## 2020-12-30 ENCOUNTER — Other Ambulatory Visit: Payer: Self-pay

## 2020-12-30 ENCOUNTER — Encounter: Payer: Self-pay | Admitting: Family Medicine

## 2020-12-30 VITALS — Ht 66.0 in | Wt 150.0 lb

## 2020-12-30 DIAGNOSIS — M810 Age-related osteoporosis without current pathological fracture: Secondary | ICD-10-CM | POA: Diagnosis not present

## 2020-12-30 DIAGNOSIS — I10 Essential (primary) hypertension: Secondary | ICD-10-CM | POA: Diagnosis not present

## 2020-12-30 DIAGNOSIS — B001 Herpesviral vesicular dermatitis: Secondary | ICD-10-CM

## 2020-12-30 DIAGNOSIS — E782 Mixed hyperlipidemia: Secondary | ICD-10-CM

## 2020-12-30 MED ORDER — CARVEDILOL 25 MG PO TABS: ORAL_TABLET | ORAL | 1 refills | Status: DC

## 2020-12-30 MED ORDER — VALACYCLOVIR HCL 1 G PO TABS: ORAL_TABLET | ORAL | 1 refills | Status: DC

## 2020-12-30 MED ORDER — LOSARTAN POTASSIUM 100 MG PO TABS: ORAL_TABLET | ORAL | 1 refills | Status: DC

## 2020-12-30 MED ORDER — IBANDRONATE SODIUM 150 MG PO TABS: 150.0000 mg | ORAL_TABLET | ORAL | 1 refills | Status: DC

## 2020-12-30 MED ORDER — ROSUVASTATIN CALCIUM 20 MG PO TABS: 20.0000 mg | ORAL_TABLET | Freq: Every day | ORAL | 1 refills | Status: DC

## 2020-12-30 NOTE — Patient Instructions (Addendum)
   Please schedule a Follow-up Appointment to: Return if symptoms worsen or fail to improve.  If you have any other questions or concerns, please feel free to call the office or send a message through MyChart. You may also schedule an earlier appointment if necessary.  Additionally, you may be receiving a survey about your experience at our office within a few days to 1 week by e-mail or mail. We value your feedback.  Daphene Chisholm, DO [facility] 

## 2020-12-30 NOTE — Progress Notes (Signed)
Virtual Visit via Telephone The purpose of this virtual visit is to provide medical care while limiting exposure to the novel coronavirus (COVID19) for both patient and office staff.  Consent was obtained for phone visit:  Yes.   Answered questions that patient had about telehealth interaction:  Yes.   I discussed the limitations, risks, security and privacy concerns of performing an evaluation and management service by telephone. I also discussed with the patient that there may be a patient responsible charge related to this service. The patient expressed understanding and agreed to proceed.  Patient Location: Home Provider Location: [facility] (Office)  Participants in virtual visit: - Patient: Lauren Cervantes - CMA: Orinda Kenner, CMA - Provider: Dr Parks Ranger  ---------------------------------------------------------------------- Chief Complaint  Patient presents with   Hypertension    S: Reviewed CMA documentation. I have called patient and gathered additional HPI as follows:  Left Tibial Fracture S/p ORIF surgery 08/13/20 Prior hospitalization 07/2020 Improving gradually on PT. Using mobility assistance device.  Essential Hypertension Needs refill Carvedilol and Losartan. Out of Losartan Had been controlled  Fever blisters recurrent Needs refill on Valtrex.  Hyperlipidemia On Statin and Zetia Needs statin refill  Osteoporosis Hx fracture recently On Boniva. Needs re order for monthly dose  Denies any high risk travel to areas of current concern for COVID19. Denies any known or suspected exposure to person with or possibly with COVID19.  Denies any fevers, chills, sweats, body ache, cough, shortness of breath, sinus pain or pressure, headache, abdominal pain, diarrhea  Past Medical History:  Diagnosis Date   Breast cancer ([place]) 2010   Hyperlipidemia    Hypertension    Lymphedema of arm    left arm.   Social History   Tobacco Use    Smoking status: Every Day    Packs/day: 0.05    Years: 30.00    Pack years: 1.50    Types: Cigarettes   Smokeless tobacco: Never   Tobacco comments:    smkes 1-3 cigs a day   Vaping Use   Vaping Use: Former  Substance Use Topics   Alcohol use: Yes    Alcohol/week: 2.0 standard drinks    Types: 1 Glasses of wine, 1 Standard drinks or equivalent per week    Comment: 1 a day most days   Drug use: No    Current Outpatient Medications:    anastrozole (ARIMIDEX) 1 MG tablet, Take 1 mg by mouth daily., Disp: , Rfl:    aspirin EC 81 MG tablet, Take by mouth., Disp: , Rfl:    blood glucose meter kit and supplies, Dispense based on patient and insurance preference. Use once daily as directed. (FOR ICD-9 250.00, 250.01)., Disp: 1 each, Rfl: 0   Blood Glucose Monitoring Suppl (ONETOUCH VERIO) w/Device KIT, U UTD, Disp: , Rfl:    Calcium Carb-Cholecalciferol (CALCIUM 1000 + D) 1000-800 MG-UNIT TABS, Take 1 tablet by mouth daily. Vitamin D 435m, Disp: 90 tablet, Rfl: 2   cetirizine (ZYRTEC) 10 MG tablet, Take 1 tablet (10 mg total) by mouth daily., Disp: 30 tablet, Rfl: 11   ezetimibe (ZETIA) 10 MG tablet, TAKE 1 TABLET(10 MG) BY MOUTH DAILY, Disp: 90 tablet, Rfl: 2   glucose blood (ONETOUCH VERIO) test strip, USE ONCE DAILY AS DIRECTED, Disp: 100 each, Rfl: 3   icosapent Ethyl (VASCEPA) 1 g capsule, Take 2 capsules (2 g total) by mouth 2 (two) times daily., Disp: 360 capsule, Rfl: 4   Multiple Vitamins-Minerals (MULTIVITAMIN WITH MINERALS)  tablet, Take 1 tablet by mouth daily., Disp: , Rfl:    ONETOUCH DELICA LANCETS FINE MISC, USE AS DIRECTED ONCE DAILY, Disp: 100 each, Rfl: 3   carvedilol (COREG) 25 MG tablet, TAKE 1 TABLET(25 MG) BY MOUTH TWICE DAILY WITH A MEAL, Disp: 180 tablet, Rfl: 1   ibandronate (BONIVA) 150 MG tablet, Take 1 tablet (150 mg total) by mouth every 30 (thirty) days., Disp: 3 tablet, Rfl: 1   losartan (COZAAR) 100 MG tablet, Take one tablet by mouth daily., Disp: 90 tablet,  Rfl: 1   rosuvastatin (CRESTOR) 20 MG tablet, Take 1 tablet (20 mg total) by mouth at bedtime., Disp: 90 tablet, Rfl: 1   valACYclovir (VALTREX) 1000 MG tablet, TAKE 2 TABLETS BY MOUTH ONCE AT START OF A COLD SORE. TAKE 1 TABLET DAILY UNTIL FEVER BILSTER IS GONE OR FOR TOTAL OF 5 DAYS, Disp: 20 tablet, Rfl: 1  Depression screen [facility] 2/9 10/26/2019 07/23/2019 06/01/2018  Decreased Interest 0 0 0  Down, Depressed, Hopeless 0 0 0  PHQ - 2 Score 0 0 0  Altered sleeping 0 - 2  Tired, decreased energy 0 - 1  Change in appetite 0 - 0  Feeling bad or failure about yourself  0 - 0  Trouble concentrating 0 - 0  Moving slowly or fidgety/restless 0 - 0  Suicidal thoughts 0 - 0  PHQ-9 Score 0 - 3  Difficult doing work/chores Not difficult at all - Not difficult at all    No flowsheet data found.  -------------------------------------------------------------------------- O: No physical exam performed due to remote telephone encounter.  Lab results reviewed.  No results found for this or any previous visit (from the past 2160 hour(s)).  -------------------------------------------------------------------------- A&P:  Problem List Items Addressed This Visit     Osteoporosis of lumbar spine   Relevant Medications   ibandronate (BONIVA) 150 MG tablet   Hyperlipidemia   Relevant Medications   losartan (COZAAR) 100 MG tablet   rosuvastatin (CRESTOR) 20 MG tablet   carvedilol (COREG) 25 MG tablet   Herpes labialis   Relevant Medications   valACYclovir (VALTREX) 1000 MG tablet   ESSENTIAL HYPERTENSION, MALIGNANT - Primary   Relevant Medications   losartan (COZAAR) 100 MG tablet   rosuvastatin (CRESTOR) 20 MG tablet   carvedilol (COREG) 25 MG tablet   Reviewed current chronic conditions Re order medications as requested  HTN HLD Controlled currently  HSV No acute flare, needs valtrex for PRN flare  OP On Boniva, continuation therapy  Meds ordered this encounter  Medications    losartan (COZAAR) 100 MG tablet    Sig: Take one tablet by mouth daily.    Dispense:  90 tablet    Refill:  1   rosuvastatin (CRESTOR) 20 MG tablet    Sig: Take 1 tablet (20 mg total) by mouth at bedtime.    Dispense:  90 tablet    Refill:  1   carvedilol (COREG) 25 MG tablet    Sig: TAKE 1 TABLET(25 MG) BY MOUTH TWICE DAILY WITH A MEAL    Dispense:  180 tablet    Refill:  1   ibandronate (BONIVA) 150 MG tablet    Sig: Take 1 tablet (150 mg total) by mouth every 30 (thirty) days.    Dispense:  3 tablet    Refill:  1   valACYclovir (VALTREX) 1000 MG tablet    Sig: TAKE 2 TABLETS BY MOUTH ONCE AT START OF A COLD SORE. TAKE 1 TABLET DAILY UNTIL FEVER  BILSTER IS GONE OR FOR TOTAL OF 5 DAYS    Dispense:  20 tablet    Refill:  1    Follow-up: - Return PRN  Patient verbalizes understanding with the above medical recommendations including the limitation of remote medical advice.  Specific follow-up and call-back criteria were given for patient to follow-up or seek medical care more urgently if needed.   - Time spent in direct consultation with patient on phone: 10 minutes   Nobie Putnam, [facility] 12/30/2020, 8:38 AM

## 2021-01-08 DIAGNOSIS — R2681 Unsteadiness on feet: Secondary | ICD-10-CM | POA: Diagnosis not present

## 2021-01-08 DIAGNOSIS — R262 Difficulty in walking, not elsewhere classified: Secondary | ICD-10-CM | POA: Diagnosis not present

## 2021-01-08 DIAGNOSIS — M25562 Pain in left knee: Secondary | ICD-10-CM | POA: Diagnosis not present

## 2021-01-08 DIAGNOSIS — M6281 Muscle weakness (generalized): Secondary | ICD-10-CM | POA: Diagnosis not present

## 2021-01-12 DIAGNOSIS — R262 Difficulty in walking, not elsewhere classified: Secondary | ICD-10-CM | POA: Diagnosis not present

## 2021-01-12 DIAGNOSIS — M25562 Pain in left knee: Secondary | ICD-10-CM | POA: Diagnosis not present

## 2021-01-12 DIAGNOSIS — R2681 Unsteadiness on feet: Secondary | ICD-10-CM | POA: Diagnosis not present

## 2021-01-12 DIAGNOSIS — M6281 Muscle weakness (generalized): Secondary | ICD-10-CM | POA: Diagnosis not present

## 2021-01-14 DIAGNOSIS — R2681 Unsteadiness on feet: Secondary | ICD-10-CM | POA: Diagnosis not present

## 2021-01-14 DIAGNOSIS — M25562 Pain in left knee: Secondary | ICD-10-CM | POA: Diagnosis not present

## 2021-01-14 DIAGNOSIS — M6281 Muscle weakness (generalized): Secondary | ICD-10-CM | POA: Diagnosis not present

## 2021-01-14 DIAGNOSIS — R262 Difficulty in walking, not elsewhere classified: Secondary | ICD-10-CM | POA: Diagnosis not present

## 2021-01-17 DIAGNOSIS — S82102A Unspecified fracture of upper end of left tibia, initial encounter for closed fracture: Secondary | ICD-10-CM | POA: Diagnosis not present

## 2021-01-19 DIAGNOSIS — M6281 Muscle weakness (generalized): Secondary | ICD-10-CM | POA: Diagnosis not present

## 2021-01-19 DIAGNOSIS — M25562 Pain in left knee: Secondary | ICD-10-CM | POA: Diagnosis not present

## 2021-01-19 DIAGNOSIS — R2681 Unsteadiness on feet: Secondary | ICD-10-CM | POA: Diagnosis not present

## 2021-01-19 DIAGNOSIS — R262 Difficulty in walking, not elsewhere classified: Secondary | ICD-10-CM | POA: Diagnosis not present

## 2021-01-21 DIAGNOSIS — M25562 Pain in left knee: Secondary | ICD-10-CM | POA: Diagnosis not present

## 2021-01-21 DIAGNOSIS — R262 Difficulty in walking, not elsewhere classified: Secondary | ICD-10-CM | POA: Diagnosis not present

## 2021-01-21 DIAGNOSIS — R2681 Unsteadiness on feet: Secondary | ICD-10-CM | POA: Diagnosis not present

## 2021-01-21 DIAGNOSIS — M6281 Muscle weakness (generalized): Secondary | ICD-10-CM | POA: Diagnosis not present

## 2021-01-26 DIAGNOSIS — M6281 Muscle weakness (generalized): Secondary | ICD-10-CM | POA: Diagnosis not present

## 2021-01-26 DIAGNOSIS — M25562 Pain in left knee: Secondary | ICD-10-CM | POA: Diagnosis not present

## 2021-01-26 DIAGNOSIS — R262 Difficulty in walking, not elsewhere classified: Secondary | ICD-10-CM | POA: Diagnosis not present

## 2021-01-26 DIAGNOSIS — R2681 Unsteadiness on feet: Secondary | ICD-10-CM | POA: Diagnosis not present

## 2021-01-28 DIAGNOSIS — R2681 Unsteadiness on feet: Secondary | ICD-10-CM | POA: Diagnosis not present

## 2021-01-28 DIAGNOSIS — M25562 Pain in left knee: Secondary | ICD-10-CM | POA: Diagnosis not present

## 2021-01-28 DIAGNOSIS — R262 Difficulty in walking, not elsewhere classified: Secondary | ICD-10-CM | POA: Diagnosis not present

## 2021-01-28 DIAGNOSIS — M6281 Muscle weakness (generalized): Secondary | ICD-10-CM | POA: Diagnosis not present

## 2021-02-02 DIAGNOSIS — M6281 Muscle weakness (generalized): Secondary | ICD-10-CM | POA: Diagnosis not present

## 2021-02-02 DIAGNOSIS — M25562 Pain in left knee: Secondary | ICD-10-CM | POA: Diagnosis not present

## 2021-02-02 DIAGNOSIS — R262 Difficulty in walking, not elsewhere classified: Secondary | ICD-10-CM | POA: Diagnosis not present

## 2021-02-04 DIAGNOSIS — M25562 Pain in left knee: Secondary | ICD-10-CM | POA: Diagnosis not present

## 2021-02-04 DIAGNOSIS — R262 Difficulty in walking, not elsewhere classified: Secondary | ICD-10-CM | POA: Diagnosis not present

## 2021-02-04 DIAGNOSIS — M6281 Muscle weakness (generalized): Secondary | ICD-10-CM | POA: Diagnosis not present

## 2021-02-11 DIAGNOSIS — M6281 Muscle weakness (generalized): Secondary | ICD-10-CM | POA: Diagnosis not present

## 2021-02-11 DIAGNOSIS — R262 Difficulty in walking, not elsewhere classified: Secondary | ICD-10-CM | POA: Diagnosis not present

## 2021-02-11 DIAGNOSIS — M25562 Pain in left knee: Secondary | ICD-10-CM | POA: Diagnosis not present

## 2021-02-16 DIAGNOSIS — S82102A Unspecified fracture of upper end of left tibia, initial encounter for closed fracture: Secondary | ICD-10-CM | POA: Diagnosis not present

## 2021-02-17 DIAGNOSIS — S82142D Displaced bicondylar fracture of left tibia, subsequent encounter for closed fracture with routine healing: Secondary | ICD-10-CM | POA: Diagnosis not present

## 2021-02-18 DIAGNOSIS — R262 Difficulty in walking, not elsewhere classified: Secondary | ICD-10-CM | POA: Diagnosis not present

## 2021-02-18 DIAGNOSIS — M25562 Pain in left knee: Secondary | ICD-10-CM | POA: Diagnosis not present

## 2021-02-18 DIAGNOSIS — M6281 Muscle weakness (generalized): Secondary | ICD-10-CM | POA: Diagnosis not present

## 2021-03-09 ENCOUNTER — Other Ambulatory Visit: Payer: Self-pay

## 2021-03-09 ENCOUNTER — Ambulatory Visit: Payer: Self-pay | Admitting: *Deleted

## 2021-03-09 ENCOUNTER — Encounter: Payer: Self-pay | Admitting: Internal Medicine

## 2021-03-09 ENCOUNTER — Telehealth (INDEPENDENT_AMBULATORY_CARE_PROVIDER_SITE_OTHER): Payer: BC Managed Care – PPO | Admitting: Internal Medicine

## 2021-03-09 VITALS — BP 120/80 | Temp 97.0°F

## 2021-03-09 DIAGNOSIS — B9789 Other viral agents as the cause of diseases classified elsewhere: Secondary | ICD-10-CM | POA: Diagnosis not present

## 2021-03-09 DIAGNOSIS — J988 Other specified respiratory disorders: Secondary | ICD-10-CM

## 2021-03-09 MED ORDER — PREDNISONE 10 MG PO TABS: ORAL_TABLET | ORAL | 0 refills | Status: DC

## 2021-03-09 MED ORDER — BENZONATATE 200 MG PO CAPS: 200.0000 mg | ORAL_CAPSULE | Freq: Three times a day (TID) | ORAL | 0 refills | Status: DC | PRN

## 2021-03-09 NOTE — Telephone Encounter (Signed)
Patient calling in with complaints of a burning sensation in the chest that started on Friday. Patient states she experiences the pain in the middle of her chest and both sides of her chest with coughing and it hurts to touch. Patient states that she experiences diarrhea that is loose, watery, and has a bad smell every time she coughs. Patient denies any drainage or nasal drip.Patient states that she believes she had a fever on Friday night due to waking up in a sweat. Patient states that she has been able to eat and drink without difficulty. Patient states that she did a COVID test at home on yesterday which was negative. Patient scheduled for appt for today at 4 pm.Attempted to call the [facility] to see if there was a sooner appt available in the day per patient request. Patient also provided with virtual care option on [facility] website, but pt refuses at this time and would like to wait for appt. Patient advised to return the call to the office with worsening symptoms before appt.    Reason for Disposition  [1] Chest pain(s) lasting a few seconds from coughing AND [2] persists > 3 days  Answer Assessment - Initial Assessment Questions 1. LOCATION: "Where does it hurt?"      In the middle and on both sides of chest  2. RADIATION: "Does the pain go anywhere else?" (e.g., into neck, jaw, arms, back)    no 3. ONSET: "When did the chest pain begin?" (Minutes, hours or days)      Started on Friday with coughing 4. PATTERN "Does the pain come and go, or has it been constant since it started?"  "Does it get worse with exertion?"      Happens with coughing and hurts to touch 5. DURATION: "How long does it last" (e.g., seconds, minutes, hours)     Friday 6. SEVERITY: "How bad is the pain?"  (e.g., Scale 1-10; mild, moderate, or severe)    - MILD (1-3): doesn't interfere with normal activities     - MODERATE (4-7): interferes with normal activities or awakens from sleep    - SEVERE (8-10): excruciating pain,  unable to do any normal activities       8 7. CARDIAC RISK FACTORS: "Do you have any history of heart problems or risk factors for heart disease?" (e.g., angina, prior heart attack; diabetes, high blood pressure, high cholesterol, smoker, or strong family history of heart disease)     High blood pressure and a smoker 8. PULMONARY RISK FACTORS: "Do you have any history of lung disease?"  (e.g., blood clots in lung, asthma, emphysema, birth control pills)     No 9. CAUSE: "What do you think is causing the chest pain?"     unknown 10. OTHER SYMPTOMS: "Do you have any other symptoms?" (e.g., dizziness, nausea, vomiting, sweating, fever, difficulty breathing, cough)       Cough, diarrhea 11. PREGNANCY: "Is there any chance you are pregnant?" "When was your last menstrual period?"       N/a  Protocols used: Chest Pain-A-AH

## 2021-03-09 NOTE — Progress Notes (Signed)
Virtual Visit via Video Note  I connected with Lauren Cervantes on 03/09/21 at  4:00 PM EST by a video enabled telemedicine application and verified that I am speaking with the correct person using two identifiers.  Location: Patient: Home Provider: Office  Person's participating in this video call: Webb Silversmith, NP and Fredderick Severance.   I discussed the limitations of evaluation and management by telemedicine and the availability of in person appointments. The patient expressed understanding and agreed to proceed.  History of Present Illness:  Pt reports cough and chest congestion. This started 3 days ago. The cough is nonproductive. She reports some chest tightness but denies chest pain or SOB. She did have headache, sore throat and chills in the beginning but this has subsided. She denies runny nose, nasal congestion, ear pain, nausea, vomiting or diarrhea. She has had sick contacts but no know flu or covid exposure. She has tried Nyquil with some relief of symptoms. She is up to date on covid vaccine but not her flu vaccine. She does smoke but has no history of asthma or COPD.   Past Medical History:  Diagnosis Date   Breast cancer ([place]) 2010   Hyperlipidemia    Hypertension    Lymphedema of arm    left arm.    Current Outpatient Medications  Medication Sig Dispense Refill   anastrozole (ARIMIDEX) 1 MG tablet Take 1 mg by mouth daily.     aspirin EC 81 MG tablet Take by mouth.     blood glucose meter kit and supplies Dispense based on patient and insurance preference. Use once daily as directed. (FOR ICD-9 250.00, 250.01). 1 each 0   Blood Glucose Monitoring Suppl (ONETOUCH VERIO) w/Device KIT U UTD     Calcium Carb-Cholecalciferol (CALCIUM 1000 + D) 1000-800 MG-UNIT TABS Take 1 tablet by mouth daily. Vitamin D $RemoveB'400mg'VsZXPNoX$  90 tablet 2   carvedilol (COREG) 25 MG tablet TAKE 1 TABLET(25 MG) BY MOUTH TWICE DAILY WITH A MEAL 180 tablet 1   cetirizine (ZYRTEC) 10 MG tablet Take 1 tablet  (10 mg total) by mouth daily. 30 tablet 11   ezetimibe (ZETIA) 10 MG tablet TAKE 1 TABLET(10 MG) BY MOUTH DAILY 90 tablet 2   glucose blood (ONETOUCH VERIO) test strip USE ONCE DAILY AS DIRECTED 100 each 3   ibandronate (BONIVA) 150 MG tablet Take 1 tablet (150 mg total) by mouth every 30 (thirty) days. 3 tablet 1   icosapent Ethyl (VASCEPA) 1 g capsule Take 2 capsules (2 g total) by mouth 2 (two) times daily. 360 capsule 4   losartan (COZAAR) 100 MG tablet Take one tablet by mouth daily. 90 tablet 1   Multiple Vitamins-Minerals (MULTIVITAMIN WITH MINERALS) tablet Take 1 tablet by mouth daily.     ONETOUCH DELICA LANCETS FINE MISC USE AS DIRECTED ONCE DAILY 100 each 3   rosuvastatin (CRESTOR) 20 MG tablet Take 1 tablet (20 mg total) by mouth at bedtime. 90 tablet 1   valACYclovir (VALTREX) 1000 MG tablet TAKE 2 TABLETS BY MOUTH ONCE AT START OF A COLD SORE. TAKE 1 TABLET DAILY UNTIL FEVER BILSTER IS GONE OR FOR TOTAL OF 5 DAYS 20 tablet 1   No current facility-administered medications for this visit.    Allergies  Allergen Reactions   Codeine Rash   Hydrocodone-Acetaminophen Rash    Family History  Problem Relation Age of Onset   Hypertension Mother    Hypertension Father    Stroke Father    Cancer Other  Coronary artery disease Other    Stroke Other    Hyperlipidemia Other    Hypertension Other     Social History   Socioeconomic History   Marital status: Single    Spouse name: Not on file   Number of children: Not on file   Years of education: Not on file   Highest education level: Not on file  Occupational History   Occupation: Glass blower/designer and Ins Agent  Tobacco Use   Smoking status: Every Day    Packs/day: 0.05    Years: 30.00    Pack years: 1.50    Types: Cigarettes   Smokeless tobacco: Never   Tobacco comments:    smkes 1-3 cigs a day   Vaping Use   Vaping Use: Former  Substance and Sexual Activity   Alcohol use: Yes    Alcohol/week: 2.0 standard  drinks    Types: 1 Glasses of wine, 1 Standard drinks or equivalent per week    Comment: 1 a day most days   Drug use: No   Sexual activity: Not on file  Other Topics Concern   Not on file  Social History Narrative   Regular exercise. Getting back into after cancer         Social Determinants of Health   Financial Resource Strain: Not on file  Food Insecurity: Not on file  Transportation Needs: Not on file  Physical Activity: Not on file  Stress: Not on file  Social Connections: Not on file  Intimate Partner Violence: Not on file     Constitutional: Denies fever, malaise, fatigue, headache or abrupt weight changes.  HEENT: Denies eye pain, eye redness, ear pain, ringing in the ears, wax buildup, runny nose, nasal congestion, bloody nose, or sore throat. Respiratory: Pt reports cough and chest congestion. Denies difficulty breathing, shortness of breath, or sputum production.   Cardiovascular: Pt reports chest tightness. Denies chest pain, palpitations or swelling in the hands or feet.  Gastrointestinal: Denies abdominal pain, bloating, constipation, diarrhea, or blood in the stool.   No other specific complaints in a complete review of systems (except as listed in HPI above).    Observations/Objective:   Wt Readings from Last 3 Encounters:  12/30/20 150 lb (68 kg)  08/07/20 165 lb (74.8 kg)  04/24/20 165 lb (74.8 kg)    General: Appears her stated age, appears unwell but in NAD. HEENT: Head: normal shape and size; Nose: no congestion noted; Throat/Mouth: hoarseness noted. Pulmonary/Chest: Normal effort. Deep cough noted. No respiratory distress.  Neurological: Alert and oriented.  BMET    Component Value Date/Time   NA 141 07/23/2019 0847   NA 143 05/12/2017 0835   K 4.4 07/23/2019 0847   CL 108 07/23/2019 0847   CO2 25 07/23/2019 0847   GLUCOSE 119 (H) 07/23/2019 0847   BUN 11 07/23/2019 0847   BUN 15 05/12/2017 0835   CREATININE 0.51 07/23/2019 0847    CALCIUM 9.4 07/23/2019 0847   GFRNONAA 104 07/23/2019 0847   GFRAA 120 07/23/2019 0847    Lipid Panel     Component Value Date/Time   CHOL 109 07/23/2019 0847   CHOL 125 05/12/2017 0835   TRIG 262 (H) 07/23/2019 0847   HDL 35 (L) 07/23/2019 0847   HDL 35 (L) 05/12/2017 0835   CHOLHDL 3.1 07/23/2019 0847   VLDL NOT CALC mg/dL 03/23/2010 2120   LDLCALC 43 07/23/2019 0847    CBC    Component Value Date/Time   WBC 6.8 07/23/2019  0847   RBC 4.10 07/23/2019 0847   HGB 13.3 07/23/2019 0847   HGB 13.6 06/24/2016 0851   HCT 39.8 07/23/2019 0847   HCT 40.8 06/24/2016 0851   PLT 184 07/23/2019 0847   PLT 185 06/24/2016 0851   MCV 97.1 07/23/2019 0847   MCV 95 06/24/2016 0851   MCH 32.4 07/23/2019 0847   MCHC 33.4 07/23/2019 0847   RDW 13.1 07/23/2019 0847   RDW 14.5 06/24/2016 0851   LYMPHSABS 2,264 07/23/2019 0847   LYMPHSABS 2.4 06/24/2016 0851   EOSABS 102 07/23/2019 0847   EOSABS 0.1 06/24/2016 0851   BASOSABS 41 07/23/2019 0847   BASOSABS 0.0 06/24/2016 0851    Hgb A1C Lab Results  Component Value Date   HGBA1C 6.3 (H) 07/23/2019        Assessment and Plan:  Viral Respiratory Illness:  Discussed flu/covid testing however at this time, it would not change our management Encouraged rest and fluids RX for Pred Taper x 6 days RX for Tessalon 200 mg TID prn No indication for abx at this time but if not feeling better by Friday, would consider Zpack  Return precautions discussed Follow Up Instructions:    I discussed the assessment and treatment plan with the patient. The patient was provided an opportunity to ask questions and all were answered. The patient agreed with the plan and demonstrated an understanding of the instructions.   The patient was advised to call back or seek an in-person evaluation if the symptoms worsen or if the condition fails to improve as anticipated.    Webb Silversmith, NP

## 2021-03-09 NOTE — Patient Instructions (Signed)

## 2021-03-11 ENCOUNTER — Other Ambulatory Visit: Payer: Self-pay | Admitting: Family Medicine

## 2021-03-11 DIAGNOSIS — E782 Mixed hyperlipidemia: Secondary | ICD-10-CM

## 2021-03-11 NOTE — Telephone Encounter (Signed)
Requested medication (s) are due for refill today:   Yes  Requested medication (s) are on the active medication list:   Yes  Future visit scheduled:   No Had a virtual visit with Dr. Parks Ranger on 12/30/2020    Last ordered: 06/04/2020 #90, 2 refills  Returned because lab work is due so protocol failed.   Requested Prescriptions  Pending Prescriptions Disp Refills   ezetimibe (ZETIA) 10 MG tablet [Pharmacy Med Name: EZETIMIBE 10MG  TABLETS] 90 tablet 2    Sig: TAKE 1 TABLET(10 MG) BY MOUTH DAILY     Cardiovascular:  Antilipid - Sterol Transport Inhibitors Failed - 03/11/2021  7:11 AM      Failed - Total Cholesterol in normal range and within 360 days    Cholesterol, Total  Date Value Ref Range Status  05/12/2017 125 100 - 199 mg/dL Final   Cholesterol  Date Value Ref Range Status  07/23/2019 109 <200 mg/dL Final          Failed - LDL in normal range and within 360 days    LDL Cholesterol (Calc)  Date Value Ref Range Status  07/23/2019 43 mg/dL (calc) Final    Comment:    Reference range: <100 . Desirable range <100 mg/dL for primary prevention;   <70 mg/dL for patients with CHD or diabetic patients  with > or = 2 CHD risk factors. Marland Kitchen LDL-C is now calculated using the Martin-Hopkins  calculation, which is a validated novel method providing  better accuracy than the Friedewald equation in the  estimation of LDL-C.  Cresenciano Genre et al. Annamaria Helling. 9622;297(98): 2061-2068  (http://education.[facility].com/faq/FAQ164)           Failed - HDL in normal range and within 360 days    HDL  Date Value Ref Range Status  07/23/2019 35 (L) > OR = 50 mg/dL Final  05/12/2017 35 (L) >39 mg/dL Final          Failed - Triglycerides in normal range and within 360 days    Triglycerides  Date Value Ref Range Status  07/23/2019 262 (H) <150 mg/dL Final    Comment:    . If a non-fasting specimen was collected, consider repeat triglyceride testing on a fasting specimen if clinically  indicated.  Yates Decamp et al. J. of Clin. Lipidol. 9211;9:417-408. Marland Kitchen           Passed - Valid encounter within last 12 months    Recent Outpatient Visits           2 days ago Viral respiratory illness   [facility] [place], Coralie Keens, NP   2 months ago Essential hypertension, malignant   [facility] [place], Devonne Doughty, DO   10 months ago Upper respiratory tract infection, unspecified type   [place], FNP   1 year ago Dysfunction of right eustachian tube   [place], FNP   1 year ago Acute non-recurrent maxillary sinusitis   [facility], Lupita Raider, [place]

## 2021-03-19 DIAGNOSIS — S82102A Unspecified fracture of upper end of left tibia, initial encounter for closed fracture: Secondary | ICD-10-CM | POA: Diagnosis not present

## 2021-03-21 ENCOUNTER — Other Ambulatory Visit: Payer: Self-pay | Admitting: Family Medicine

## 2021-03-21 DIAGNOSIS — E782 Mixed hyperlipidemia: Secondary | ICD-10-CM

## 2021-03-22 NOTE — Telephone Encounter (Signed)
Needs appt and blood work Last RF 06/04/20 #90 2 RF Sent pt message to call [facility] to make appt for further refills. Requested Prescriptions  Pending Prescriptions Disp Refills   ezetimibe (ZETIA) 10 MG tablet [Pharmacy Med Name: EZETIMIBE 10MG  TABLETS] 90 tablet 2    Sig: TAKE 1 TABLET(10 MG) BY MOUTH DAILY     Cardiovascular:  Antilipid - Sterol Transport Inhibitors Failed - 03/21/2021  1:19 PM      Failed - Total Cholesterol in normal range and within 360 days    Cholesterol, Total  Date Value Ref Range Status  05/12/2017 125 100 - 199 mg/dL Final   Cholesterol  Date Value Ref Range Status  07/23/2019 109 <200 mg/dL Final          Failed - LDL in normal range and within 360 days    LDL Cholesterol (Calc)  Date Value Ref Range Status  07/23/2019 43 mg/dL (calc) Final    Comment:    Reference range: <100 . Desirable range <100 mg/dL for primary prevention;   <70 mg/dL for patients with CHD or diabetic patients  with > or = 2 CHD risk factors. Marland Kitchen LDL-C is now calculated using the Martin-Hopkins  calculation, which is a validated novel method providing  better accuracy than the Friedewald equation in the  estimation of LDL-C.  Cresenciano Genre et al. Annamaria Helling. 2458;099(83): 2061-2068  (http://education.[facility].com/faq/FAQ164)           Failed - HDL in normal range and within 360 days    HDL  Date Value Ref Range Status  07/23/2019 35 (L) > OR = 50 mg/dL Final  05/12/2017 35 (L) >39 mg/dL Final          Failed - Triglycerides in normal range and within 360 days    Triglycerides  Date Value Ref Range Status  07/23/2019 262 (H) <150 mg/dL Final    Comment:    . If a non-fasting specimen was collected, consider repeat triglyceride testing on a fasting specimen if clinically indicated.  Yates Decamp et al. J. of Clin. Lipidol. 3825;0:539-767. Marland Kitchen           Passed - Valid encounter within last 12 months    Recent Outpatient Visits           1 week ago Viral  respiratory illness   [facility] [place], Coralie Keens, NP   2 months ago Essential hypertension, malignant   [facility] [place] Hauser, DO   11 months ago Upper respiratory tract infection, unspecified type   [place], FNP   1 year ago Dysfunction of right eustachian tube   [place], FNP   1 year ago Acute non-recurrent maxillary sinusitis   [facility], Lupita Raider, [place]

## 2021-03-23 ENCOUNTER — Other Ambulatory Visit: Payer: Self-pay | Admitting: Internal Medicine

## 2021-03-23 DIAGNOSIS — E782 Mixed hyperlipidemia: Secondary | ICD-10-CM

## 2021-03-23 NOTE — Telephone Encounter (Signed)
Copied from [place] [id number]. Topic: Quick Communication - Rx Refill/Question >> Mar 23, 2021 10:13 AM Yvette Rack wrote: Medication: ezetimibe (ZETIA) 10 MG tablet  Has the patient contacted their pharmacy? Yes.  Pt advised to contact provider (Agent: If no, request that the patient contact the pharmacy for the refill. If patient does not wish to contact the pharmacy document the reason why and proceed with request.) (Agent: If yes, when and what did the pharmacy advise?)  Preferred Pharmacy (with phone number or street name): [place], [address] Phone: [phone]   Fax: [phone]  Has the patient been seen for an appointment in the last year OR does the patient have an upcoming appointment? Yes.    Agent: Please be advised that RX refills may take up to 3 business days. We ask that you follow-up with your pharmacy.

## 2021-03-25 NOTE — Telephone Encounter (Signed)
Requested medication (s) are due for refill today: yes  Requested medication (s) are on the active medication list: yes  Last refill:  06/04/20 #90/2RF  Future visit scheduled: no  Notes to clinic:  Unable to refill per protocol due to failed labs, no updated results.      Requested Prescriptions  Pending Prescriptions Disp Refills   ezetimibe (ZETIA) 10 MG tablet 90 tablet 2    Sig: TAKE 1 TABLET(10 MG) BY MOUTH DAILY     Cardiovascular:  Antilipid - Sterol Transport Inhibitors Failed - 03/23/2021  8:31 PM      Failed - Total Cholesterol in normal range and within 360 days    Cholesterol, Total  Date Value Ref Range Status  05/12/2017 125 100 - 199 mg/dL Final   Cholesterol  Date Value Ref Range Status  07/23/2019 109 <200 mg/dL Final          Failed - LDL in normal range and within 360 days    LDL Cholesterol (Calc)  Date Value Ref Range Status  07/23/2019 43 mg/dL (calc) Final    Comment:    Reference range: <100 . Desirable range <100 mg/dL for primary prevention;   <70 mg/dL for patients with CHD or diabetic patients  with > or = 2 CHD risk factors. Marland Kitchen LDL-C is now calculated using the Martin-Hopkins  calculation, which is a validated novel method providing  better accuracy than the Friedewald equation in the  estimation of LDL-C.  Cresenciano Genre et al. Annamaria Helling. 9201;007(12): 2061-2068  (http://education.[facility].com/faq/FAQ164)           Failed - HDL in normal range and within 360 days    HDL  Date Value Ref Range Status  07/23/2019 35 (L) > OR = 50 mg/dL Final  05/12/2017 35 (L) >39 mg/dL Final          Failed - Triglycerides in normal range and within 360 days    Triglycerides  Date Value Ref Range Status  07/23/2019 262 (H) <150 mg/dL Final    Comment:    . If a non-fasting specimen was collected, consider repeat triglyceride testing on a fasting specimen if clinically indicated.  Yates Decamp et al. J. of Clin. Lipidol. 1975;8:832-549. Marland Kitchen            Passed - Valid encounter within last 12 months    Recent Outpatient Visits           2 weeks ago Viral respiratory illness   [facility] [place], Coralie Keens, NP   2 months ago Essential hypertension, malignant   [facility] Olin Hauser, DO   11 months ago Upper respiratory tract infection, unspecified type   [place], FNP   1 year ago Dysfunction of right eustachian tube   [place], FNP   1 year ago Acute non-recurrent maxillary sinusitis   [facility], Lupita Raider, [place]

## 2021-03-30 MED ORDER — EZETIMIBE 10 MG PO TABS: ORAL_TABLET | ORAL | 2 refills | Status: DC

## 2021-04-18 DIAGNOSIS — S82102A Unspecified fracture of upper end of left tibia, initial encounter for closed fracture: Secondary | ICD-10-CM | POA: Diagnosis not present

## 2021-05-05 DIAGNOSIS — S82142A Displaced bicondylar fracture of left tibia, initial encounter for closed fracture: Secondary | ICD-10-CM | POA: Diagnosis not present

## 2021-05-05 DIAGNOSIS — I10 Essential (primary) hypertension: Secondary | ICD-10-CM | POA: Diagnosis not present

## 2021-05-05 DIAGNOSIS — E785 Hyperlipidemia, unspecified: Secondary | ICD-10-CM | POA: Diagnosis not present

## 2021-05-05 DIAGNOSIS — X58XXXA Exposure to other specified factors, initial encounter: Secondary | ICD-10-CM | POA: Diagnosis not present

## 2021-05-05 DIAGNOSIS — S82142D Displaced bicondylar fracture of left tibia, subsequent encounter for closed fracture with routine healing: Secondary | ICD-10-CM | POA: Diagnosis not present

## 2021-05-19 DIAGNOSIS — S82102A Unspecified fracture of upper end of left tibia, initial encounter for closed fracture: Secondary | ICD-10-CM | POA: Diagnosis not present

## 2021-06-10 ENCOUNTER — Other Ambulatory Visit: Payer: Self-pay | Admitting: Family Medicine

## 2021-06-10 DIAGNOSIS — M810 Age-related osteoporosis without current pathological fracture: Secondary | ICD-10-CM

## 2021-06-10 DIAGNOSIS — E782 Mixed hyperlipidemia: Secondary | ICD-10-CM

## 2021-06-10 DIAGNOSIS — I10 Essential (primary) hypertension: Secondary | ICD-10-CM

## 2021-06-10 NOTE — Telephone Encounter (Signed)
Requested medication (s) are due for refill today: All meds due 06/29/21 ° °Requested medication (s) are on the active medication list: yes   ° °Last refill: All meds last refilled 12/30/20 for 6 month supply ° °Future visit scheduled no ° °Notes to clinic:Failed due to labs, please review. Thank you. ° °Requested Prescriptions  °Pending Prescriptions Disp Refills  ° rosuvastatin (CRESTOR) 20 MG tablet [Pharmacy Med Name: ROSUVASTATIN 20MG TABLETS] 90 tablet 1  °  Sig: TAKE 1 TABLET(20 MG) BY MOUTH AT BEDTIME  °  ° Cardiovascular:  Antilipid - Statins 2 Failed - 06/10/2021  7:09 AM  °  °  Failed - Cr in normal range and within 360 days  °  Creat  °Date Value Ref Range Status  °07/23/2019 0.51 0.50 - 0.99 mg/dL Final  °  Comment:  °  For patients >49 years of age, the reference limit °for Creatinine is approximately 13% higher for people °identified as African-American. °. °  ° °Creatinine, Urine  °Date Value Ref Range Status  °07/23/2019 78 20 - 275 mg/dL Final  °  °  °  °  Failed - Lipid Panel in normal range within the last 12 months  °  Cholesterol, Total  °Date Value Ref Range Status  °05/12/2017 125 100 - 199 mg/dL Final  ° °Cholesterol  °Date Value Ref Range Status  °07/23/2019 109 <200 mg/dL Final  ° °LDL Cholesterol (Calc)  °Date Value Ref Range Status  °07/23/2019 43 mg/dL (calc) Final  °  Comment:  °  Reference range: <100 °. °Desirable range <100 mg/dL for primary prevention;   °<70 mg/dL for patients with CHD or diabetic patients  °with > or = 2 CHD risk factors. °. °LDL-C is now calculated using the Martin-Hopkins  °calculation, which is a validated novel method providing  °better accuracy than the Friedewald equation in the  °estimation of LDL-C.  °Martin SS et al. JAMA. 2013;310(19): 2061-2068  °(http://education.[facility].com/faq/FAQ164) °  ° °HDL  °Date Value Ref Range Status  °07/23/2019 35 (L) > OR = 50 mg/dL Final  °05/12/2017 35 (L) >39 mg/dL Final  ° °Triglycerides  °Date Value Ref Range  Status  °07/23/2019 262 (H) <150 mg/dL Final  °  Comment:  °  . °If a non-fasting specimen was collected, consider °repeat triglyceride testing on a fasting specimen °if clinically indicated.  °Jacobson et al. J. of Clin. Lipidol. 2015;9:129-169. °. °  ° °  °  °  Passed - Patient is not pregnant  °  °  Passed - Valid encounter within last 12 months  °  Recent Outpatient Visits   ° °      ° 3 months ago Viral respiratory illness  ° [facility] Baity, Regina W, NP  ° 5 months ago Essential hypertension, malignant  ° [facility] Karamalegos, Alexander J, DO  ° 1 year ago Upper respiratory tract infection, unspecified type  ° [facility] Malfi, Nicole M, FNP  ° 1 year ago Dysfunction of right eustachian tube  ° [facility] Malfi, Nicole M, FNP  ° 1 year ago Acute non-recurrent maxillary sinusitis  ° [facility] Malfi, Nicole M, FNP  ° °  °  ° °  °  °  ° carvedilol (COREG) 25 MG tablet [Pharmacy Med Name: CARVEDILOL 25MG TABLETS] 180 tablet 1  °  Sig: TAKE 1 TABLET(25 MG) BY MOUTH TWICE DAILY WITH A MEAL  °  ° Cardiovascular:   Beta Blockers 3 Failed - 06/10/2021  7:09 AM      Failed - Cr in normal range and within 360 days    Creat  Date Value Ref Range Status  07/23/2019 0.51 0.50 - 0.99 mg/dL Final    Comment:    For patients >24 years of age, the reference limit for Creatinine is approximately 13% higher for people identified as African-American. .    Creatinine, Urine  Date Value Ref Range Status  07/23/2019 78 20 - 275 mg/dL Final          Failed - AST in normal range and within 360 days    AST  Date Value Ref Range Status  07/23/2019 22 10 - 35 U/L Final          Failed - ALT in normal range and within 360 days    ALT  Date Value Ref Range Status  07/23/2019 33 (H) 6 - 29 U/L Final          Passed - Last BP in normal range    BP Readings from Last 1 Encounters:  03/09/21 120/80          Passed - Last  Heart Rate in normal range    Pulse Readings from Last 1 Encounters:  08/07/20 85          Passed - Valid encounter within last 6 months    Recent Outpatient Visits           3 months ago Viral respiratory illness   [place], Coralie Keens, NP   5 months ago Essential hypertension, malignant   [facility] [place], Devonne Doughty, DO   1 year ago Upper respiratory tract infection, unspecified type   [facility], Lupita Raider, FNP   1 year ago Dysfunction of right eustachian tube   [facility], Lupita Raider, FNP   1 year ago Acute non-recurrent maxillary sinusitis   [place], Lupita Raider, FNP               ibandronate (BONIVA) 150 MG tablet [Pharmacy Med Name: IBANDRONATE SODIUM 150MG TABLETS] 3 tablet 1    Sig: TAKE 1 TABLET(150 MG) BY MOUTH EVERY 30 DAYS     Endocrinology:  Bisphosphonates Failed - 06/10/2021  7:09 AM      Failed - Ca in normal range and within 360 days    Calcium  Date Value Ref Range Status  07/23/2019 9.4 8.6 - 10.4 mg/dL Final          Failed - Vitamin D in normal range and within 360 days    No results found for: [id number], [id number], [id number], 25OHVITD3, 25OHVITD2, 25OHVITD3, 25OHVITD2, 25OHVITD1, 25OHVITD2, 25OHVITD3, VD25OH        Failed - Cr in normal range and within 360 days    Creat  Date Value Ref Range Status  07/23/2019 0.51 0.50 - 0.99 mg/dL Final    Comment:    For patients >44 years of age, the reference limit for Creatinine is approximately 13% higher for people identified as African-American. .    Creatinine, Urine  Date Value Ref Range Status  07/23/2019 78 20 - 275 mg/dL Final          Failed - Mg Level in normal range and within 360 days    No results found for: MG        Failed - Phosphate in normal range and within  360 days    No results found for: PHOS        Failed - eGFR is 30 or above and within 360 days     GFR, Est African American  Date Value Ref Range Status  07/23/2019 120 > OR = 60 mL/min/1.50m Final   GFR, Est Non African American  Date Value Ref Range Status  07/23/2019 104 > OR = 60 mL/min/1.72mFinal          Failed - Bone Mineral Density or Dexa Scan completed in the last 2 years      Passed - Valid encounter within last 12 months    Recent Outpatient Visits           3 months ago Viral respiratory illness   [place]   5 months ago Essential hypertension, malignant   [facility]   1 year ago Upper respiratory tract infection, unspecified type   [place]   1 year ago Dysfunction of right eustachian tube   [place]   1 year ago Acute non-recurrent maxillary sinusitis   [facility]

## 2021-07-13 ENCOUNTER — Other Ambulatory Visit: Payer: Self-pay | Admitting: Internal Medicine

## 2021-07-13 DIAGNOSIS — I10 Essential (primary) hypertension: Secondary | ICD-10-CM

## 2021-07-13 NOTE — Telephone Encounter (Signed)
Medication Refill - Medication: losartan (COZAAR) 100 MG tablet  ? ?Has the patient contacted their pharmacy? Yes.   ?(Agent: If no, request that the patient contact the pharmacy for the refill. If patient does not wish to contact the pharmacy document the reason why and proceed with request.) ?(Agent: If yes, when and what did the pharmacy advise?)they  gave her 5 pills and she needed to contact office  ? ?Preferred Pharmacy (with phone number or street name): [facility] [place]  ?[place], [place] [address]  ?Phone:  [phone]  Fax:  [phone] ?Has the patient been seen for an appointment in the last year OR does the patient have an upcoming appointment? Yes.   ? ?Agent: Please be advised that RX refills may take up to 3 business days. We ask that you follow-up with your pharmacy. ?

## 2021-07-14 MED ORDER — LOSARTAN POTASSIUM 100 MG PO TABS: ORAL_TABLET | ORAL | 0 refills | Status: DC

## 2021-07-14 NOTE — Telephone Encounter (Signed)
Requested medication (s) are due for refill today: Yes ? ?Requested medication (s) are on the active medication list: Yes ? ?Last refill:  12/30/20 ? ?Future visit scheduled: Yes ? ?Notes to clinic:  Unable to refill per protocol due to failed labs, no updated results. ? ? ? ? ? ?Requested Prescriptions  ?Pending Prescriptions Disp Refills  ? losartan (COZAAR) 100 MG tablet 90 tablet 1  ?  Sig: Take one tablet by mouth daily.  ?  ? Cardiovascular:  Angiotensin Receptor Blockers Failed - 07/13/2021  2:32 PM  ?  ?  Failed - Cr in normal range and within 180 days  ?  Creat  ?Date Value Ref Range Status  ?07/23/2019 0.51 0.50 - 0.99 mg/dL Final  ?  Comment:  ?  For patients >39 years of age, the reference limit ?for Creatinine is approximately 13% higher for people ?identified as African-American. ?. ?  ? ?Creatinine, Urine  ?Date Value Ref Range Status  ?07/23/2019 78 20 - 275 mg/dL Final  ?  ?  ?  ?  Failed - K in normal range and within 180 days  ?  Potassium  ?Date Value Ref Range Status  ?07/23/2019 4.4 3.5 - 5.3 mmol/L Final  ?  ?  ?  ?  Passed - Patient is not pregnant  ?  ?  Passed - Last BP in normal range  ?  BP Readings from Last 1 Encounters:  ?03/09/21 120/80  ?  ?  ?  ?  Passed - Valid encounter within last 6 months  ?  Recent Outpatient Visits   ? ?      ? 4 months ago Viral respiratory illness  ? [facility] [place], [place] W, NP  ? 6 months ago Essential hypertension, malignant  ? Black, DO  ? 1 year ago Upper respiratory tract infection, unspecified type  ? [place], FNP  ? 1 year ago Dysfunction of right eustachian tube  ? [place], FNP  ? 1 year ago Acute non-recurrent maxillary sinusitis  ? [facility], Lupita Raider, FNP  ? ?  ?  ?Future Appointments   ? ?        ? In 6 days Mecum, Dani Gobble, PA-C [facility], [place]  ? ?  ? ?  ?  ?  ? ? ? ? ?

## 2021-07-17 DIAGNOSIS — Z1231 Encounter for screening mammogram for malignant neoplasm of breast: Secondary | ICD-10-CM | POA: Diagnosis not present

## 2021-07-17 LAB — HM MAMMOGRAPHY

## 2021-07-20 ENCOUNTER — Other Ambulatory Visit: Payer: Self-pay

## 2021-07-20 ENCOUNTER — Ambulatory Visit (INDEPENDENT_AMBULATORY_CARE_PROVIDER_SITE_OTHER): Payer: BC Managed Care – PPO | Admitting: Physician Assistant

## 2021-07-20 ENCOUNTER — Encounter: Payer: Self-pay | Admitting: Physician Assistant

## 2021-07-20 VITALS — BP 126/68 | HR 69 | Temp 96.4°F | Wt 154.0 lb

## 2021-07-20 DIAGNOSIS — M810 Age-related osteoporosis without current pathological fracture: Secondary | ICD-10-CM | POA: Diagnosis not present

## 2021-07-20 DIAGNOSIS — E781 Pure hyperglyceridemia: Secondary | ICD-10-CM

## 2021-07-20 DIAGNOSIS — F17200 Nicotine dependence, unspecified, uncomplicated: Secondary | ICD-10-CM | POA: Insufficient documentation

## 2021-07-20 DIAGNOSIS — I10 Essential (primary) hypertension: Secondary | ICD-10-CM | POA: Diagnosis not present

## 2021-07-20 DIAGNOSIS — E785 Hyperlipidemia, unspecified: Secondary | ICD-10-CM

## 2021-07-20 DIAGNOSIS — R7303 Prediabetes: Secondary | ICD-10-CM

## 2021-07-20 MED ORDER — CARVEDILOL 25 MG PO TABS: 25.0000 mg | ORAL_TABLET | Freq: Two times a day (BID) | ORAL | 0 refills | Status: DC

## 2021-07-20 MED ORDER — ICOSAPENT ETHYL 1 G PO CAPS: 2.0000 g | ORAL_CAPSULE | Freq: Two times a day (BID) | ORAL | 1 refills | Status: DC

## 2021-07-20 MED ORDER — IBANDRONATE SODIUM 150 MG PO TABS: 150.0000 mg | ORAL_TABLET | ORAL | 1 refills | Status: DC

## 2021-07-20 MED ORDER — LOSARTAN POTASSIUM 100 MG PO TABS: 100.0000 mg | ORAL_TABLET | Freq: Every day | ORAL | 0 refills | Status: DC

## 2021-07-20 NOTE — Assessment & Plan Note (Addendum)
Chronic, historic condition  ?Appears to be stable at this time, managed with losartan 100 mg PO QD along with Carvedilol 25 mg PO BID  ?Continue current medications ?Patient reports BP measures are typically in 120s/ 70s ?Discussed diet and exercise as appropriate supplementation to medication regimen ?Follow up in 6 months for monitoring  ?

## 2021-07-20 NOTE — Progress Notes (Signed)
? ?Established Patient Office Visit ? ?Subjective:  ?Patient ID: Lauren Cervantes, female    DOB: 10/02/1955  Age: 66 y.o. MRN: [id number] ? ?Today's Provider: Talitha Givens, MHS, PA-C ?Introduced myself to the patient as a Journalist, newspaper and provided education on APPs in clinical practice.  ? ? ? ?CC:  Chronic condition follow up, medication refills ? ? ? ?HPI ?Lauren Cervantes presents for chronic condition follow up and lab work.  ? ?HTN ?Currently taking Losartan and Carvedilol.  ?She is concerned that her BP is getting too low at times - states it has been in 120s/70s  ?States she is cautious in the AM to prevent orthostatic changes. ? ?Diet: Follow normal diet. Is trying to balance macros. ?Exercise: Unable to walk due to leg injury, exercise is limited  ? ?Prediabetes ?Not checking blood glucose at home  ?Previous A1c in 2021 was 6.3% ? ?Hyperlipidemia ?Is trying to reduce saturated fat consumption ?Currently taking Zetia and Rosuvastatin, Vascepa  ? ?Smoking Cessation ?She is currently smoking 4-5 cigarettes per day but has intention to quit. ?Patient is not interested in resources but wants to quit once her work stress has been reduced.  ? ? ? ?Past Medical History:  ?Diagnosis Date  ? Breast cancer ([place]) 2010  ? Hyperlipidemia   ? Hypertension   ? Lymphedema of arm   ? left arm.  ? ? ?Past Surgical History:  ?Procedure Laterality Date  ? BREAST LUMPECTOMY    ? TOTAL ABDOMINAL HYSTERECTOMY    ? ? ?Family History  ?Problem Relation Age of Onset  ? Hypertension Mother   ? Hypertension Father   ? Stroke Father   ? Cancer Other   ? Coronary artery disease Other   ? Stroke Other   ? Hyperlipidemia Other   ? Hypertension Other   ? ? ?Social History  ? ?Socioeconomic History  ? Marital status: Single  ?  Spouse name: Not on file  ? Number of children: Not on file  ? Years of education: Not on file  ? Highest education level: Not on file  ?Occupational History  ? Occupation: Writer  ?Tobacco Use  ?  Smoking status: Every Day  ?  Packs/day: 0.05  ?  Years: 30.00  ?  Pack years: 1.50  ?  Types: Cigarettes  ? Smokeless tobacco: Never  ? Tobacco comments:  ?  smkes 1-3 cigs a day   ?Vaping Use  ? Vaping Use: Former  ?Substance and Sexual Activity  ? Alcohol use: Yes  ?  Alcohol/week: 2.0 standard drinks  ?  Types: 1 Glasses of wine, 1 Standard drinks or equivalent per week  ?  Comment: 1 a day most days  ? Drug use: No  ? Sexual activity: Not on file  ?Other Topics Concern  ? Not on file  ?Social History Narrative  ? Regular exercise. Getting back into after cancer  ?   ?   ? ?Social Determinants of Health  ? ?Financial Resource Strain: Not on file  ?Food Insecurity: Not on file  ?Transportation Needs: Not on file  ?Physical Activity: Not on file  ?Stress: Not on file  ?Social Connections: Not on file  ?Intimate Partner Violence: Not on file  ? ? ?Outpatient Medications Prior to Visit  ?Medication Sig Dispense Refill  ? aspirin EC 81 MG tablet Take by mouth.    ? Calcium Carb-Cholecalciferol (CALCIUM 1000 + D) 1000-800 MG-UNIT TABS Take 1 tablet by mouth daily. Vitamin  D 425m 90 tablet 2  ? cetirizine (ZYRTEC) 10 MG tablet Take 1 tablet (10 mg total) by mouth daily. 30 tablet 11  ? ezetimibe (ZETIA) 10 MG tablet TAKE 1 TABLET(10 MG) BY MOUTH DAILY 90 tablet 2  ? Multiple Vitamins-Minerals (MULTIVITAMIN WITH MINERALS) tablet Take 1 tablet by mouth daily.    ? rosuvastatin (CRESTOR) 20 MG tablet Take 1 tablet (20 mg total) by mouth at bedtime. 90 tablet 1  ? valACYclovir (VALTREX) 1000 MG tablet TAKE 2 TABLETS BY MOUTH ONCE AT START OF A COLD SORE. TAKE 1 TABLET DAILY UNTIL FEVER BILSTER IS GONE OR FOR TOTAL OF 5 DAYS 20 tablet 1  ? anastrozole (ARIMIDEX) 1 MG tablet Take 1 mg by mouth daily.    ? carvedilol (COREG) 25 MG tablet TAKE 1 TABLET(25 MG) BY MOUTH TWICE DAILY WITH A MEAL 180 tablet 1  ? ibandronate (BONIVA) 150 MG tablet Take 1 tablet (150 mg total) by mouth every 30 (thirty) days. 3 tablet 1  ? icosapent  Ethyl (VASCEPA) 1 g capsule Take 2 capsules (2 g total) by mouth 2 (two) times daily. 360 capsule 4  ? losartan (COZAAR) 100 MG tablet Take one tablet by mouth daily. 30 tablet 0  ? benzonatate (TESSALON) 200 MG capsule Take 1 capsule (200 mg total) by mouth 3 (three) times daily as needed for cough. 30 capsule 0  ? blood glucose meter kit and supplies Dispense based on patient and insurance preference. Use once daily as directed. (FOR ICD-9 250.00, 250.01). 1 each 0  ? Blood Glucose Monitoring Suppl (ONETOUCH VERIO) w/Device KIT U UTD    ? glucose blood (ONETOUCH VERIO) test strip USE ONCE DAILY AS DIRECTED (Patient not taking: Reported on 07/20/2021) 100 each 3  ? ONETOUCH DELICA LANCETS FINE MISC USE AS DIRECTED ONCE DAILY 100 each 3  ? predniSONE (DELTASONE) 10 MG tablet Take 6 tabs on day 1, 5 tabs on day 2, 4 tabs on day 3, 3 tabs on day 4, 2 tabs on day 5, 1 tab on day 6 21 tablet 0  ? ?No facility-administered medications prior to visit.  ? ? ?Allergies  ?Allergen Reactions  ? Codeine Rash  ? Hydrocodone-Acetaminophen Rash  ? ? ?ROS ?Review of Systems  ?Constitutional:  Negative for activity change, fatigue and fever.  ?Respiratory:  Negative for cough, shortness of breath and wheezing.   ?Cardiovascular:  Negative for chest pain and palpitations.  ?Neurological:  Negative for dizziness, weakness, light-headedness and headaches.  ? ?  ?Objective:  ?  ?Physical Exam ?Vitals reviewed.  ?Constitutional:   ?   General: She is awake.  ?   Appearance: Normal appearance. She is well-developed, well-groomed and normal weight.  ?HENT:  ?   Head: Normocephalic and atraumatic.  ?Cardiovascular:  ?   Rate and Rhythm: Normal rate and regular rhythm.  ?   Pulses: Normal pulses.     ?     Radial pulses are 2+ on the right side and 2+ on the left side.  ?   Heart sounds: Normal heart sounds.  ?   Comments: No carotid bruit on auscultation  ?Musculoskeletal:  ?   Right lower leg: No edema.  ?   Left lower leg: No edema.   ?Neurological:  ?   Mental Status: She is alert.  ?   GCS: GCS eye subscore is 4. GCS verbal subscore is 5. GCS motor subscore is 6.  ?Psychiatric:     ?   Attention  and Perception: Attention normal.     ?   Mood and Affect: Mood normal.     ?   Speech: Speech normal.     ?   Behavior: Behavior normal. Behavior is cooperative.  ? ? ?BP 126/68 (BP Location: Right Arm, Patient Position: Sitting, Cuff Size: Large)   Pulse 69   Temp (!) 96.4 ?F (35.8 ?C) (Temporal)   Wt 154 lb (69.9 kg)   SpO2 99%   BMI 24.86 kg/m?  ?Wt Readings from Last 3 Encounters:  ?07/20/21 154 lb (69.9 kg)  ?12/30/20 150 lb (68 kg)  ?08/07/20 165 lb (74.8 kg)  ? ? ? ?Health Maintenance Due  ?Topic Date Due  ? TETANUS/TDAP  Never done  ? MAMMOGRAM  05/10/2020  ? ? ?There are no preventive care reminders to display for this patient. ? ?Lab Results  ?Component Value Date  ? TSH 1.80 07/23/2019  ? ?Lab Results  ?Component Value Date  ? WBC 6.8 07/23/2019  ? HGB 13.3 07/23/2019  ? HCT 39.8 07/23/2019  ? MCV 97.1 07/23/2019  ? PLT 184 07/23/2019  ? ?Lab Results  ?Component Value Date  ? NA 141 07/23/2019  ? K 4.4 07/23/2019  ? CO2 25 07/23/2019  ? GLUCOSE 119 (H) 07/23/2019  ? BUN 11 07/23/2019  ? CREATININE 0.51 07/23/2019  ? BILITOT 0.4 07/23/2019  ? ALKPHOS 61 05/12/2017  ? AST 22 07/23/2019  ? ALT 33 (H) 07/23/2019  ? PROT 6.6 07/23/2019  ? ALBUMIN 4.6 05/12/2017  ? CALCIUM 9.4 07/23/2019  ? ?Lab Results  ?Component Value Date  ? CHOL 109 07/23/2019  ? ?Lab Results  ?Component Value Date  ? HDL 35 (L) 07/23/2019  ? ?Lab Results  ?Component Value Date  ? [place] 43 07/23/2019  ? ?Lab Results  ?Component Value Date  ? TRIG 262 (H) 07/23/2019  ? ?Lab Results  ?Component Value Date  ? CHOLHDL 3.1 07/23/2019  ? ?Lab Results  ?Component Value Date  ? HGBA1C 6.3 (H) 07/23/2019  ? ? ?  ?Assessment & Plan:  ? ? ?Problem List Items Addressed This Visit   ? ?  ? Cardiovascular and Mediastinum  ? ESSENTIAL HYPERTENSION, MALIGNANT  ?  Chronic, historic  condition  ?Appears to be stable at this time, managed with losartan 100 mg PO QD along with Carvedilol 25 mg PO BID  ?Continue current medications ?Patient reports BP measures are typically in 120s/ 70s ?Discussed di

## 2021-07-20 NOTE — Assessment & Plan Note (Signed)
Chronic smoker  ?States she is trying to gradually decrease number of cigarettes she smokes per day  ?States she is down to 4 per day but has been under a great deal of work and personal stress and fully quitting is not an option right now.  ?Offered resources to assist with cessation efforts, patient declined stating she has tried them but found them ineffective before.  ?Recommend monitoring and continued efforts to gradually decrease smoking at this time.  ?

## 2021-07-20 NOTE — Assessment & Plan Note (Signed)
Chronic, historic condition ?Currently managed with Rosuvastatin 20 mg PO QD , Vascepa 2 g PO BID, and Zetia 10 mg PO QD ?She appears to be tolerating well but reports she is having to skip Rosuvastatin some days due to cramping  ?Refills provided  ?Will recheck Lipid panel today and management strategy to follow based on results ?Follow up in 3 months recommended ?

## 2021-07-20 NOTE — Assessment & Plan Note (Signed)
Appears chronic, patient denies having blood glucose control issues or diabetes type condition today ?Most recent A1c (6.3%)  is from 2021 so will recheck today and initiate management plan as appropriate ?Recommend she follow a diabetes conscious diet to assist with prevention of progression to T2DM ?Follow up in 3 months  ?

## 2021-07-21 LAB — CBC WITH DIFFERENTIAL/PLATELET
Absolute Monocytes: 580 cells/uL (ref 200–950)
Basophils Absolute: 50 cells/uL (ref 0–200)
Basophils Relative: 0.6 %
Eosinophils Absolute: 118 cells/uL (ref 15–500)
Eosinophils Relative: 1.4 %
HCT: 41.2 % (ref 35.0–45.0)
Hemoglobin: 13.9 g/dL (ref 11.7–15.5)
Lymphs Abs: 2940 cells/uL (ref 850–3900)
MCH: 33.4 pg — ABNORMAL HIGH (ref 27.0–33.0)
MCHC: 33.7 g/dL (ref 32.0–36.0)
MCV: 99 fL (ref 80.0–100.0)
MPV: 11.2 fL (ref 7.5–12.5)
Monocytes Relative: 6.9 %
Neutro Abs: 4712 cells/uL (ref 1500–7800)
Neutrophils Relative %: 56.1 %
Platelets: 186 10*3/uL (ref 140–400)
RBC: 4.16 10*6/uL (ref 3.80–5.10)
RDW: 12.8 % (ref 11.0–15.0)
Total Lymphocyte: 35 %
WBC: 8.4 10*3/uL (ref 3.8–10.8)

## 2021-07-21 LAB — LIPID PANEL
Cholesterol: 170 mg/dL (ref <200)
HDL: 44 mg/dL — ABNORMAL LOW (ref >=50)
LDL Cholesterol (Calc): 100 mg/dL (calc) — ABNORMAL HIGH
Non-HDL Cholesterol (Calc): 126 mg/dL (calc) (ref <130)
Total CHOL/HDL Ratio: 3.9 (calc) (ref <5.0)
Triglycerides: 166 mg/dL — ABNORMAL HIGH (ref <150)

## 2021-07-21 LAB — COMPREHENSIVE METABOLIC PANEL
AG Ratio: 1.9 (calc) (ref 1.0–2.5)
ALT: 29 U/L (ref 6–29)
AST: 23 U/L (ref 10–35)
Albumin: 4.5 g/dL (ref 3.6–5.1)
Alkaline phosphatase (APISO): 53 U/L (ref 37–153)
BUN: 19 mg/dL (ref 7–25)
CO2: 26 mmol/L (ref 20–32)
Calcium: 9.8 mg/dL (ref 8.6–10.4)
Chloride: 106 mmol/L (ref 98–110)
Creat: 0.76 mg/dL (ref 0.50–1.05)
Globulin: 2.4 g/dL (calc) (ref 1.9–3.7)
Glucose, Bld: 111 mg/dL — ABNORMAL HIGH (ref 65–99)
Potassium: 4.7 mmol/L (ref 3.5–5.3)
Sodium: 141 mmol/L (ref 135–146)
Total Bilirubin: 0.4 mg/dL (ref 0.2–1.2)
Total Protein: 6.9 g/dL (ref 6.1–8.1)

## 2021-07-21 LAB — HEMOGLOBIN A1C
Hgb A1c MFr Bld: 5.9 % of total Hgb — ABNORMAL HIGH (ref <5.7)
Mean Plasma Glucose: 123 mg/dL
eAG (mmol/L): 6.8 mmol/L

## 2021-09-04 ENCOUNTER — Telehealth: Payer: Self-pay

## 2021-09-04 NOTE — Telephone Encounter (Signed)
I filled out this form yesterday and placed in my out box for the front desk to fax and scan ?

## 2021-09-04 NOTE — Telephone Encounter (Signed)
Copied from [place]. Topic: General - Call Back - No Documentation ?>> Sep 01, 2021  1:38 PM Scherrie Gerlach wrote: ?Reason for CRM: Lauren Cervantes w/ [facility] states pt will be seen next week and sedated.  They have faxed medical consult and was hoping Dr Raliegh Ip would fill it out and fax back. ?Would  like call back to advise. Did you get fax? She will resend now. Please call if you do not receive and she would like call back today. thanks ?[phone] ?>> Sep 03, 2021 11:22 AM Pawlus, Apolonio Schneiders wrote: ?Caller from the dental office is still waiting to hear back, pt has sedation on Monday 5/15, stated it was urgent to get a response by today.  ?>> Sep 02, 2021  8:10 AM Scherrie Gerlach wrote: ?Pt calling to follow up on this request. Pt states her dentist sedation is on Monday.  They only do once a month, so she would hate to miss out and have to wait until June.  Please call. ?

## 2021-09-04 NOTE — Telephone Encounter (Signed)
The form was in my box and I did not realize she was not my patient until yesterday Thursday 5/11 and I placed it in Regina's mail box. ? ?Nobie Putnam, DO ?[facility] ?[facility] ?09/04/2021, 10:45 AM ? ?

## 2021-09-04 NOTE — Telephone Encounter (Signed)
Please review... ? ?I'm not sure who has this.  The message is requesting Dr. Raliegh Ip to fill out the form but it looks like she is Lauren Cervantes's pt.   ? ? ?

## 2021-09-29 ENCOUNTER — Other Ambulatory Visit: Payer: Self-pay | Admitting: Family Medicine

## 2021-09-29 ENCOUNTER — Other Ambulatory Visit: Payer: Self-pay | Admitting: Internal Medicine

## 2021-09-29 DIAGNOSIS — B001 Herpesviral vesicular dermatitis: Secondary | ICD-10-CM

## 2021-09-29 DIAGNOSIS — I10 Essential (primary) hypertension: Secondary | ICD-10-CM

## 2021-09-30 MED ORDER — LOSARTAN POTASSIUM 100 MG PO TABS: 100.0000 mg | ORAL_TABLET | Freq: Every day | ORAL | 0 refills | Status: DC

## 2021-09-30 NOTE — Telephone Encounter (Signed)
Requested Prescriptions  Pending Prescriptions Disp Refills  . valACYclovir (VALTREX) 1000 MG tablet [Pharmacy Med Name: VALACYCLOVIR 1GM TABLETS] 20 tablet 1    Sig: TAKE 2 TABLETS BY MOUTH 1 TIME. START OF A COLD SORE. TAKE 1 TABLET DAILY UNTIL FEVER BILSTER IS GONE OR FOR TOTAL OF 5 DAYS     Antimicrobials:  Antiviral Agents - Anti-Herpetic Passed - 09/29/2021  3:25 PM      Passed - Valid encounter within last 12 months    Recent Outpatient Visits          2 months ago Hyperlipidemia, unspecified hyperlipidemia type   [place], [place]   6 months ago Viral respiratory illness   [facility], Coralie Keens, NP   9 months ago Essential hypertension, malignant   [facility] Olin Hauser, DO   1 year ago Upper respiratory tract infection, unspecified type   [place], FNP   1 year ago Dysfunction of right eustachian tube   [facility], Lupita Raider, FNP

## 2021-10-14 ENCOUNTER — Other Ambulatory Visit: Payer: Self-pay | Admitting: Internal Medicine

## 2021-10-14 DIAGNOSIS — I10 Essential (primary) hypertension: Secondary | ICD-10-CM

## 2021-10-14 DIAGNOSIS — E782 Mixed hyperlipidemia: Secondary | ICD-10-CM

## 2021-10-14 MED ORDER — CARVEDILOL 25 MG PO TABS: 25.0000 mg | ORAL_TABLET | Freq: Two times a day (BID) | ORAL | 0 refills | Status: DC

## 2021-10-14 MED ORDER — ROSUVASTATIN CALCIUM 20 MG PO TABS: 20.0000 mg | ORAL_TABLET | Freq: Every day | ORAL | 0 refills | Status: DC

## 2021-10-14 NOTE — Telephone Encounter (Signed)
Medication Refill - Medication: rosuvastatin (CRESTOR) 20 MG tablet and carvedilol (COREG) 25 MG tablet  Has the patient contacted their pharmacy? Yes.   Pt told to contact provider  Preferred Pharmacy (with phone number or street name):  [place], [place] Phone:  [phone]  Fax:  [phone]     Has the patient been seen for an appointment in the last year OR does the patient have an upcoming appointment? Yes.     Agent: Please be advised that RX refills may take up to 3 business days. We ask that you follow-up with your pharmacy.

## 2021-10-14 NOTE — Telephone Encounter (Signed)
Requested Prescriptions  Pending Prescriptions Disp Refills  . rosuvastatin (CRESTOR) 20 MG tablet 90 tablet 0    Sig: Take 1 tablet (20 mg total) by mouth at bedtime.     Cardiovascular:  Antilipid - Statins 2 Failed - 10/14/2021 10:21 AM      Failed - Lipid Panel in normal range within the last 12 months    Cholesterol, Total  Date Value Ref Range Status  05/12/2017 125 100 - 199 mg/dL Final   Cholesterol  Date Value Ref Range Status  07/20/2021 170 <200 mg/dL Final   LDL Cholesterol (Calc)  Date Value Ref Range Status  07/20/2021 100 (H) mg/dL (calc) Final    Comment:    Reference range: <100 . Desirable range <100 mg/dL for primary prevention;   <70 mg/dL for patients with CHD or diabetic patients  with > or = 2 CHD risk factors. Marland Kitchen LDL-C is now calculated using the Martin-Hopkins  calculation, which is a validated novel method providing  better accuracy than the Friedewald equation in the  estimation of LDL-C.  Cresenciano Genre et al. Annamaria Helling. 5176;160(73): 2061-2068  (http://education.[facility].com/faq/FAQ164)    HDL  Date Value Ref Range Status  07/20/2021 44 (L) > OR = 50 mg/dL Final  05/12/2017 35 (L) >39 mg/dL Final   Triglycerides  Date Value Ref Range Status  07/20/2021 166 (H) <150 mg/dL Final         Passed - Cr in normal range and within 360 days    Creat  Date Value Ref Range Status  07/20/2021 0.76 0.50 - 1.05 mg/dL Final   Creatinine, Urine  Date Value Ref Range Status  07/23/2019 78 20 - 275 mg/dL Final         Passed - Patient is not pregnant      Passed - Valid encounter within last 12 months    Recent Outpatient Visits          2 months ago Hyperlipidemia, unspecified hyperlipidemia type   [facility], [place], [place]   7 months ago Viral respiratory illness   [facility] [place], Coralie Keens, NP   9 months ago Essential hypertension, malignant   [facility] [place], Devonne Doughty, DO    1 year ago Upper respiratory tract infection, unspecified type   [facility], Lupita Raider, FNP   1 year ago Dysfunction of right eustachian tube   [facility], Lupita Raider, FNP             . carvedilol (COREG) 25 MG tablet 180 tablet 0    Sig: Take 1 tablet (25 mg total) by mouth 2 (two) times daily with a meal. TAKE 1 TABLET(25 MG) BY MOUTH TWICE DAILY WITH A MEAL     Cardiovascular: Beta Blockers 3 Passed - 10/14/2021 10:21 AM      Passed - Cr in normal range and within 360 days    Creat  Date Value Ref Range Status  07/20/2021 0.76 0.50 - 1.05 mg/dL Final   Creatinine, Urine  Date Value Ref Range Status  07/23/2019 78 20 - 275 mg/dL Final         Passed - AST in normal range and within 360 days    AST  Date Value Ref Range Status  07/20/2021 23 10 - 35 U/L Final         Passed - ALT in normal range and within 360 days    ALT  Date Value Ref Range Status  07/20/2021 29 6 - 29 U/L Final         Passed - Last BP in normal range    BP Readings from Last 1 Encounters:  07/20/21 126/68         Passed - Last Heart Rate in normal range    Pulse Readings from Last 1 Encounters:  07/20/21 69         Passed - Valid encounter within last 6 months    Recent Outpatient Visits          2 months ago Hyperlipidemia, unspecified hyperlipidemia type   [place], [place]   7 months ago Viral respiratory illness   [facility] [place], Coralie Keens, NP   9 months ago Essential hypertension, malignant   [facility] Olin Hauser, DO   1 year ago Upper respiratory tract infection, unspecified type   [place], FNP   1 year ago Dysfunction of right eustachian tube   [facility], Lupita Raider, FNP

## 2021-12-27 ENCOUNTER — Other Ambulatory Visit: Payer: Self-pay | Admitting: Family Medicine

## 2021-12-27 ENCOUNTER — Other Ambulatory Visit: Payer: Self-pay | Admitting: Internal Medicine

## 2021-12-27 DIAGNOSIS — E782 Mixed hyperlipidemia: Secondary | ICD-10-CM

## 2021-12-27 DIAGNOSIS — I10 Essential (primary) hypertension: Secondary | ICD-10-CM

## 2021-12-29 NOTE — Telephone Encounter (Signed)
Requested Prescriptions  Pending Prescriptions Disp Refills  . carvedilol (COREG) 25 MG tablet [Pharmacy Med Name: CARVEDILOL '25MG'$  TABLETS] 180 tablet 0    Sig: TAKE 1 TABLET(25 MG) BY MOUTH TWICE DAILY WITH A MEAL     Cardiovascular: Beta Blockers 3 Passed - 12/27/2021  7:03 AM      Passed - Cr in normal range and within 360 days    Creat  Date Value Ref Range Status  07/20/2021 0.76 0.50 - 1.05 mg/dL Final   Creatinine, Urine  Date Value Ref Range Status  07/23/2019 78 20 - 275 mg/dL Final         Passed - AST in normal range and within 360 days    AST  Date Value Ref Range Status  07/20/2021 23 10 - 35 U/L Final         Passed - ALT in normal range and within 360 days    ALT  Date Value Ref Range Status  07/20/2021 29 6 - 29 U/L Final         Passed - Last BP in normal range    BP Readings from Last 1 Encounters:  07/20/21 126/68         Passed - Last Heart Rate in normal range    Pulse Readings from Last 1 Encounters:  07/20/21 69         Passed - Valid encounter within last 6 months    Recent Outpatient Visits          5 months ago Hyperlipidemia, unspecified hyperlipidemia type   [facility], [place], [place]   9 months ago Viral respiratory illness   [place], NP   12 months ago Essential hypertension, malignant   [facility] Olin Hauser, DO   1 year ago Upper respiratory tract infection, unspecified type   [place], FNP   2 years ago Dysfunction of right eustachian tube   [facility], Lupita Raider, [place]             . rosuvastatin (CRESTOR) 20 MG tablet [Pharmacy Med Name: ROSUVASTATIN '20MG'$  TABLETS] 90 tablet 0    Sig: TAKE 1 TABLET(20 MG) BY MOUTH AT BEDTIME     Cardiovascular:  Antilipid - Statins 2 Failed - 12/27/2021  7:03 AM      Failed - Lipid Panel in normal range within the last 12 months    Cholesterol, Total   Date Value Ref Range Status  05/12/2017 125 100 - 199 mg/dL Final   Cholesterol  Date Value Ref Range Status  07/20/2021 170 <200 mg/dL Final   LDL Cholesterol (Calc)  Date Value Ref Range Status  07/20/2021 100 (H) mg/dL (calc) Final    Comment:    Reference range: <100 . Desirable range <100 mg/dL for primary prevention;   <70 mg/dL for patients with CHD or diabetic patients  with > or = 2 CHD risk factors. Marland Kitchen LDL-C is now calculated using the Martin-Hopkins  calculation, which is a validated novel method providing  better accuracy than the Friedewald equation in the  estimation of LDL-C.  Cresenciano Genre et al. Annamaria Helling. 6314;970(26): 2061-2068  (http://education.[facility].com/faq/FAQ164)    HDL  Date Value Ref Range Status  07/20/2021 44 (L) > OR = 50 mg/dL Final  05/12/2017 35 (L) >39 mg/dL Final   Triglycerides  Date Value Ref Range Status  07/20/2021 166 (H) <150 mg/dL  Final         Passed - Cr in normal range and within 360 days    Creat  Date Value Ref Range Status  07/20/2021 0.76 0.50 - 1.05 mg/dL Final   Creatinine, Urine  Date Value Ref Range Status  07/23/2019 78 20 - 275 mg/dL Final         Passed - Patient is not pregnant      Passed - Valid encounter within last 12 months    Recent Outpatient Visits          5 months ago Hyperlipidemia, unspecified hyperlipidemia type   [place], [place]   9 months ago Viral respiratory illness   [facility] [place], Coralie Keens, NP   12 months ago Essential hypertension, malignant   [facility] Olin Hauser, DO   1 year ago Upper respiratory tract infection, unspecified type   [place], FNP   2 years ago Dysfunction of right eustachian tube   [facility], Lupita Raider, [place]

## 2021-12-29 NOTE — Telephone Encounter (Signed)
Requested Prescriptions  Pending Prescriptions Disp Refills  . ezetimibe (ZETIA) 10 MG tablet [Pharmacy Med Name: EZETIMIBE '10MG'$  TABLETS] 90 tablet 1    Sig: TAKE 1 TABLET(10 MG) BY MOUTH DAILY     Cardiovascular:  Antilipid - Sterol Transport Inhibitors Failed - 12/27/2021  7:03 AM      Failed - Lipid Panel in normal range within the last 12 months    Cholesterol, Total  Date Value Ref Range Status  05/12/2017 125 100 - 199 mg/dL Final   Cholesterol  Date Value Ref Range Status  07/20/2021 170 <200 mg/dL Final   LDL Cholesterol (Calc)  Date Value Ref Range Status  07/20/2021 100 (H) mg/dL (calc) Final    Comment:    Reference range: <100 . Desirable range <100 mg/dL for primary prevention;   <70 mg/dL for patients with CHD or diabetic patients  with > or = 2 CHD risk factors. Marland Kitchen LDL-C is now calculated using the Martin-Hopkins  calculation, which is a validated novel method providing  better accuracy than the Friedewald equation in the  estimation of LDL-C.  Cresenciano Genre et al. Annamaria Helling. 6010;932(35): 2061-2068  (http://education.[facility].com/faq/FAQ164)    HDL  Date Value Ref Range Status  07/20/2021 44 (L) > OR = 50 mg/dL Final  05/12/2017 35 (L) >39 mg/dL Final   Triglycerides  Date Value Ref Range Status  07/20/2021 166 (H) <150 mg/dL Final         Passed - AST in normal range and within 360 days    AST  Date Value Ref Range Status  07/20/2021 23 10 - 35 U/L Final         Passed - ALT in normal range and within 360 days    ALT  Date Value Ref Range Status  07/20/2021 29 6 - 29 U/L Final         Passed - Patient is not pregnant      Passed - Valid encounter within last 12 months    Recent Outpatient Visits          5 months ago Hyperlipidemia, unspecified hyperlipidemia type   [place], [place]   9 months ago Viral respiratory illness   [facility] [place], Coralie Keens, NP   12 months ago Essential  hypertension, malignant   [facility] Olin Hauser, DO   1 year ago Upper respiratory tract infection, unspecified type   [place], FNP   2 years ago Dysfunction of right eustachian tube   [facility], Lupita Raider, [place]

## 2022-02-11 ENCOUNTER — Other Ambulatory Visit: Payer: Self-pay | Admitting: Internal Medicine

## 2022-02-11 DIAGNOSIS — I10 Essential (primary) hypertension: Secondary | ICD-10-CM

## 2022-02-11 NOTE — Telephone Encounter (Signed)
Called patient, scheduled future OV for 02/18/22. Will refill medications.  Requested Prescriptions  Pending Prescriptions Disp Refills  . losartan (COZAAR) 100 MG tablet [Pharmacy Med Name: LOSARTAN POTASSIUM 100 MG TAB] 30 tablet     Sig: TAKE 1 TABLET BY MOUTH EVERY DAY     Cardiovascular:  Angiotensin Receptor Blockers Failed - 02/11/2022  3:27 AM      Failed - Cr in normal range and within 180 days    Creat  Date Value Ref Range Status  07/20/2021 0.76 0.50 - 1.05 mg/dL Final   Creatinine, Urine  Date Value Ref Range Status  07/23/2019 78 20 - 275 mg/dL Final         Failed - K in normal range and within 180 days    Potassium  Date Value Ref Range Status  07/20/2021 4.7 3.5 - 5.3 mmol/L Final         Failed - Valid encounter within last 6 months    Recent Outpatient Visits          6 months ago Hyperlipidemia, unspecified hyperlipidemia type   [facility], [place]i [place], [place]   11 months ago Viral respiratory illness   [facility] [place], Coralie Keens, NP   1 year ago Essential hypertension, malignant   [place], DO   1 year ago Upper respiratory tract infection, unspecified type   [place], FNP   2 years ago Dysfunction of right eustachian tube   [facility], Lupita Raider, FNP      Future Appointments            In 1 week Garnette Gunner, Coralie Keens, NP [facility], [place] - Patient is not pregnant      Passed - Last BP in normal range    BP Readings from Last 1 Encounters:  07/20/21 126/68

## 2022-02-18 ENCOUNTER — Ambulatory Visit (INDEPENDENT_AMBULATORY_CARE_PROVIDER_SITE_OTHER): Payer: Managed Care, Other (non HMO) | Admitting: Internal Medicine

## 2022-02-18 ENCOUNTER — Encounter: Payer: Self-pay | Admitting: Internal Medicine

## 2022-02-18 VITALS — BP 136/86 | HR 68 | Temp 96.2°F | Ht 66.0 in | Wt 163.0 lb

## 2022-02-18 DIAGNOSIS — Z6826 Body mass index (BMI) 26.0-26.9, adult: Secondary | ICD-10-CM | POA: Diagnosis not present

## 2022-02-18 DIAGNOSIS — R7303 Prediabetes: Secondary | ICD-10-CM

## 2022-02-18 DIAGNOSIS — E663 Overweight: Secondary | ICD-10-CM | POA: Diagnosis not present

## 2022-02-18 DIAGNOSIS — Z6825 Body mass index (BMI) 25.0-25.9, adult: Secondary | ICD-10-CM | POA: Insufficient documentation

## 2022-02-18 DIAGNOSIS — Z0001 Encounter for general adult medical examination with abnormal findings: Secondary | ICD-10-CM

## 2022-02-18 LAB — COMPLETE METABOLIC PANEL WITH GFR
AG Ratio: 2.4 (calc) (ref 1.0–2.5)
ALT: 23 U/L (ref 6–29)
AST: 16 U/L (ref 10–35)
Albumin: 4.8 g/dL (ref 3.6–5.1)
Alkaline phosphatase (APISO): 52 U/L (ref 37–153)
BUN: 13 mg/dL (ref 7–25)
CO2: 27 mmol/L (ref 20–32)
Calcium: 9.6 mg/dL (ref 8.6–10.4)
Chloride: 104 mmol/L (ref 98–110)
Creat: 0.56 mg/dL (ref 0.50–1.05)
Globulin: 2 g/dL (calc) (ref 1.9–3.7)
Glucose, Bld: 100 mg/dL — ABNORMAL HIGH (ref 65–99)
Potassium: 4.5 mmol/L (ref 3.5–5.3)
Sodium: 140 mmol/L (ref 135–146)
Total Bilirubin: 0.6 mg/dL (ref 0.2–1.2)
Total Protein: 6.8 g/dL (ref 6.1–8.1)
eGFR: 102 mL/min/{1.73_m2} (ref >=60)

## 2022-02-18 LAB — LIPID PANEL
Cholesterol: 144 mg/dL (ref <200)
HDL: 39 mg/dL — ABNORMAL LOW (ref >=50)
LDL Cholesterol (Calc): 67 mg/dL (calc)
Non-HDL Cholesterol (Calc): 105 mg/dL (calc) (ref <130)
Total CHOL/HDL Ratio: 3.7 (calc) (ref <5.0)
Triglycerides: 318 mg/dL — ABNORMAL HIGH (ref <150)

## 2022-02-18 LAB — POCT GLYCOSYLATED HEMOGLOBIN (HGB A1C): Hemoglobin A1C: 6.2 % — AB (ref 4.0–5.6)

## 2022-02-18 MED ORDER — CARVEDILOL 25 MG PO TABS: ORAL_TABLET | ORAL | 1 refills | Status: DC

## 2022-02-18 MED ORDER — ROSUVASTATIN CALCIUM 20 MG PO TABS: ORAL_TABLET | ORAL | 1 refills | Status: DC

## 2022-02-18 MED ORDER — IBANDRONATE SODIUM 150 MG PO TABS: 150.0000 mg | ORAL_TABLET | ORAL | 1 refills | Status: DC

## 2022-02-18 MED ORDER — EZETIMIBE 10 MG PO TABS: ORAL_TABLET | ORAL | 1 refills | Status: DC

## 2022-02-18 MED ORDER — LOSARTAN POTASSIUM 100 MG PO TABS: 100.0000 mg | ORAL_TABLET | Freq: Every day | ORAL | 0 refills | Status: DC

## 2022-02-18 MED ORDER — ICOSAPENT ETHYL 1 G PO CAPS: 2.0000 g | ORAL_CAPSULE | Freq: Two times a day (BID) | ORAL | 1 refills | Status: DC

## 2022-02-18 MED ORDER — VALACYCLOVIR HCL 1 G PO TABS: ORAL_TABLET | ORAL | 1 refills | Status: DC

## 2022-02-18 NOTE — Addendum Note (Signed)
Addended by: Kizzie Furnish on: 02/18/2022 08:49 AM   Modules accepted: Orders

## 2022-02-18 NOTE — Patient Instructions (Signed)
Health Maintenance for Postmenopausal Women Menopause is a normal process in which your ability to get pregnant comes to an end. This process happens slowly over many months or years, usually between the ages of 48 and 55. Menopause is complete when you have missed your menstrual period for 12 months. It is important to talk with your health care provider about some of the most common conditions that affect women after menopause (postmenopausal women). These include heart disease, cancer, and bone loss (osteoporosis). Adopting a healthy lifestyle and getting preventive care can help to promote your health and wellness. The actions you take can also lower your chances of developing some of these common conditions. What are the signs and symptoms of menopause? During menopause, you may have the following symptoms: Hot flashes. These can be moderate or severe. Night sweats. Decrease in sex drive. Mood swings. Headaches. Tiredness (fatigue). Irritability. Memory problems. Problems falling asleep or staying asleep. Talk with your health care provider about treatment options for your symptoms. Do I need hormone replacement therapy? Hormone replacement therapy is effective in treating symptoms that are caused by menopause, such as hot flashes and night sweats. Hormone replacement carries certain risks, especially as you become older. If you are thinking about using estrogen or estrogen with progestin, discuss the benefits and risks with your health care provider. How can I reduce my risk for heart disease and stroke? The risk of heart disease, heart attack, and stroke increases as you age. One of the causes may be a change in the body's hormones during menopause. This can affect how your body uses dietary fats, triglycerides, and cholesterol. Heart attack and stroke are medical emergencies. There are many things that you can do to help prevent heart disease and stroke. Watch your blood pressure High  blood pressure causes heart disease and increases the risk of stroke. This is more likely to develop in people who have high blood pressure readings or are overweight. Have your blood pressure checked: Every 3-5 years if you are 18-39 years of age. Every year if you are 40 years old or older. Eat a healthy diet  Eat a diet that includes plenty of vegetables, fruits, low-fat dairy products, and lean protein. Do not eat a lot of foods that are high in solid fats, added sugars, or sodium. Get regular exercise Get regular exercise. This is one of the most important things you can do for your health. Most adults should: Try to exercise for at least 150 minutes each week. The exercise should increase your heart rate and make you sweat (moderate-intensity exercise). Try to do strengthening exercises at least twice each week. Do these in addition to the moderate-intensity exercise. Spend less time sitting. Even light physical activity can be beneficial. Other tips Work with your health care provider to achieve or maintain a healthy weight. Do not use any products that contain nicotine or tobacco. These products include cigarettes, chewing tobacco, and vaping devices, such as e-cigarettes. If you need help quitting, ask your health care provider. Know your numbers. Ask your health care provider to check your cholesterol and your blood sugar (glucose). Continue to have your blood tested as directed by your health care provider. Do I need screening for cancer? Depending on your health history and family history, you may need to have cancer screenings at different stages of your life. This may include screening for: Breast cancer. Cervical cancer. Lung cancer. Colorectal cancer. What is my risk for osteoporosis? After menopause, you may be   at increased risk for osteoporosis. Osteoporosis is a condition in which bone destruction happens more quickly than new bone creation. To help prevent osteoporosis or  the bone fractures that can happen because of osteoporosis, you may take the following actions: If you are 19-50 years old, get at least 1,000 mg of calcium and at least 600 international units (IU) of vitamin D per day. If you are older than age 50 but younger than age 70, get at least 1,200 mg of calcium and at least 600 international units (IU) of vitamin D per day. If you are older than age 70, get at least 1,200 mg of calcium and at least 800 international units (IU) of vitamin D per day. Smoking and drinking excessive alcohol increase the risk of osteoporosis. Eat foods that are rich in calcium and vitamin D, and do weight-bearing exercises several times each week as directed by your health care provider. How does menopause affect my mental health? Depression may occur at any age, but it is more common as you become older. Common symptoms of depression include: Feeling depressed. Changes in sleep patterns. Changes in appetite or eating patterns. Feeling an overall lack of motivation or enjoyment of activities that you previously enjoyed. Frequent crying spells. Talk with your health care provider if you think that you are experiencing any of these symptoms. General instructions See your health care provider for regular wellness exams and vaccines. This may include: Scheduling regular health, dental, and eye exams. Getting and maintaining your vaccines. These include: Influenza vaccine. Get this vaccine each year before the flu season begins. Pneumonia vaccine. Shingles vaccine. Tetanus, diphtheria, and pertussis (Tdap) booster vaccine. Your health care provider may also recommend other immunizations. Tell your health care provider if you have ever been abused or do not feel safe at home. Summary Menopause is a normal process in which your ability to get pregnant comes to an end. This condition causes hot flashes, night sweats, decreased interest in sex, mood swings, headaches, or lack  of sleep. Treatment for this condition may include hormone replacement therapy. Take actions to keep yourself healthy, including exercising regularly, eating a healthy diet, watching your weight, and checking your blood pressure and blood sugar levels. Get screened for cancer and depression. Make sure that you are up to date with all your vaccines. This information is not intended to replace advice given to you by your health care provider. Make sure you discuss any questions you have with your health care provider. Document Revised: 09/01/2020 Document Reviewed: 09/01/2020 Elsevier Patient Education  2023 [employer]  

## 2022-02-18 NOTE — Progress Notes (Signed)
Subjective:    Patient ID: Lauren Cervantes, female    DOB: December 14, 1955, 66 y.o.   MRN: [id number]  HPI  Patient presents to clinic today for her annual exam.  Flu: 12/2018 Tetanus: > 10 years COVID: Moderna X2 Shingrix: 05/2018, 09/2018 Pap smear: Hysterectomy Mammogram: 06/2021 Bone density: 05/2018 Colon screening: 11/2019, Cologuard Vision screening: annually Dentist: biannually  Diet: She does eat meat. She consumes fruits and veggies. She tries to avoid fried foods. She drinks mostly water. Exercise: Stationary bike  Review of Systems     Past Medical History:  Diagnosis Date   Breast cancer ([place]) 2010   Hyperlipidemia    Hypertension    Lymphedema of arm    left arm.    Current Outpatient Medications  Medication Sig Dispense Refill   aspirin EC 81 MG tablet Take by mouth.     Calcium Carb-Cholecalciferol (CALCIUM 1000 + D) 1000-800 MG-UNIT TABS Take 1 tablet by mouth daily. Vitamin D 472m 90 tablet 2   carvedilol (COREG) 25 MG tablet TAKE 1 TABLET(25 MG) BY MOUTH TWICE DAILY WITH A MEAL 180 tablet 0   cetirizine (ZYRTEC) 10 MG tablet Take 1 tablet (10 mg total) by mouth daily. 30 tablet 11   ezetimibe (ZETIA) 10 MG tablet TAKE 1 TABLET(10 MG) BY MOUTH DAILY 90 tablet 1   ibandronate (BONIVA) 150 MG tablet Take 1 tablet (150 mg total) by mouth every 30 (thirty) days. 3 tablet 1   icosapent Ethyl (VASCEPA) 1 g capsule Take 2 capsules (2 g total) by mouth 2 (two) times daily. 360 capsule 1   losartan (COZAAR) 100 MG tablet TAKE 1 TABLET BY MOUTH EVERY DAY 90 tablet 0   Multiple Vitamins-Minerals (MULTIVITAMIN WITH MINERALS) tablet Take 1 tablet by mouth daily.     rosuvastatin (CRESTOR) 20 MG tablet TAKE 1 TABLET(20 MG) BY MOUTH AT BEDTIME 90 tablet 1   valACYclovir (VALTREX) 1000 MG tablet TAKE 2 TABLETS BY MOUTH 1 TIME. START OF A COLD SORE. TAKE 1 TABLET DAILY UNTIL FEVER BILSTER IS GONE OR FOR TOTAL OF 5 DAYS 20 tablet 1   No current facility-administered  medications for this visit.    Allergies  Allergen Reactions   Codeine Rash   Hydrocodone-Acetaminophen Rash    Family History  Problem Relation Age of Onset   Hypertension Mother    Hypertension Father    Stroke Father    Cancer Other    Coronary artery disease Other    Stroke Other    Hyperlipidemia Other    Hypertension Other     Social History   Socioeconomic History   Marital status: Single    Spouse name: Not on file   Number of children: Not on file   Years of education: Not on file   Highest education level: Not on file  Occupational History   Occupation: OGlass blower/designerand Ins Agent  Tobacco Use   Smoking status: Every Day    Packs/day: 0.05    Years: 30.00    Total pack years: 1.50    Types: Cigarettes   Smokeless tobacco: Never   Tobacco comments:    smkes 1-3 cigs a day   Vaping Use   Vaping Use: Former  Substance and Sexual Activity   Alcohol use: Yes    Alcohol/week: 2.0 standard drinks of alcohol    Types: 1 Glasses of wine, 1 Standard drinks or equivalent per week    Comment: 1 a day most days   Drug  use: No   Sexual activity: Not on file  Other Topics Concern   Not on file  Social History Narrative   Regular exercise. Getting back into after cancer         Social Determinants of Health   Financial Resource Strain: Not on file  Food Insecurity: Not on file  Transportation Needs: Not on file  Physical Activity: Not on file  Stress: Not on file  Social Connections: Not on file  Intimate Partner Violence: Not on file     Constitutional: Patient reports intermittent headaches.  Denies fever, malaise, fatigue, or abrupt weight changes.  HEENT: Denies eye pain, eye redness, ear pain, ringing in the ears, wax buildup, runny nose, nasal congestion, bloody nose, or sore throat. Respiratory: Denies difficulty breathing, shortness of breath, cough or sputum production.   Cardiovascular: Denies chest pain, chest tightness, palpitations or  swelling in the hands or feet.  Gastrointestinal: Denies abdominal pain, bloating, constipation, diarrhea or blood in the stool.  GU: Denies urgency, frequency, pain with urination, burning sensation, blood in urine, odor or discharge. Musculoskeletal: Pt reports left knee pain. Denies decrease in range of motion, difficulty with gait, muscle pain or joint swelling.  Skin: Denies redness, rashes, lesions or ulcercations.  Neurological: Denies dizziness, difficulty with memory, difficulty with speech or problems with balance and coordination.  Psych: Patient reports stress.  Denies anxiety, depression, SI/HI.  No other specific complaints in a complete review of systems (except as listed in HPI above).  Objective:   Physical Exam   BP 136/86 (BP Location: Right Arm, Patient Position: Sitting, Cuff Size: Normal)   Pulse 68   Temp (!) 96.2 F (35.7 C) (Temporal)   Ht '5\' 6"'  (1.676 m)   Wt 163 lb (73.9 kg)   SpO2 100%   BMI 26.31 kg/m   Wt Readings from Last 3 Encounters:  07/20/21 154 lb (69.9 kg)  12/30/20 150 lb (68 kg)  08/07/20 165 lb (74.8 kg)    General: Appears her stated age, overweight, in NAD. Skin: Warm, dry and intact.  HEENT: Head: normal shape and size; Eyes: sclera white, no icterus, conjunctiva pink, PERRLA and EOMs intact;  Neck:  Neck supple, trachea midline. No masses, lumps or thyromegaly present.  Cardiovascular: Normal rate and rhythm. S1,S2 noted.  No murmur, rubs or gallops noted. No JVD or BLE edema. No carotid bruits noted. Pulmonary/Chest: Normal effort and positive vesicular breath sounds. No respiratory distress. No wheezes, rales or ronchi noted.  Abdomen: Normal bowel sounds. Musculoskeletal: Strength 5/5 BUE/BLE.  No difficulty with gait.  Neurological: Alert and oriented. Cranial nerves II-XII grossly intact. Coordination normal.  Psychiatric: Mood and affect normal. Behavior is normal. Judgment and thought content normal.    BMET    Component  Value Date/Time   NA 141 07/20/2021 0935   NA 143 05/12/2017 0835   K 4.7 07/20/2021 0935   CL 106 07/20/2021 0935   CO2 26 07/20/2021 0935   GLUCOSE 111 (H) 07/20/2021 0935   BUN 19 07/20/2021 0935   BUN 15 05/12/2017 0835   CREATININE 0.76 07/20/2021 0935   CALCIUM 9.8 07/20/2021 0935   GFRNONAA 104 07/23/2019 0847   GFRAA 120 07/23/2019 0847    Lipid Panel     Component Value Date/Time   CHOL 170 07/20/2021 0935   CHOL 125 05/12/2017 0835   TRIG 166 (H) 07/20/2021 0935   HDL 44 (L) 07/20/2021 0935   HDL 35 (L) 05/12/2017 0835   CHOLHDL 3.9  07/20/2021 0935   VLDL NOT CALC mg/dL 03/23/2010 2120   LDLCALC 100 (H) 07/20/2021 0935    CBC    Component Value Date/Time   WBC 8.4 07/20/2021 0935   RBC 4.16 07/20/2021 0935   HGB 13.9 07/20/2021 0935   HGB 13.6 06/24/2016 0851   HCT 41.2 07/20/2021 0935   HCT 40.8 06/24/2016 0851   PLT 186 07/20/2021 0935   PLT 185 06/24/2016 0851   MCV 99.0 07/20/2021 0935   MCV 95 06/24/2016 0851   MCH 33.4 (H) 07/20/2021 0935   MCHC 33.7 07/20/2021 0935   RDW 12.8 07/20/2021 0935   RDW 14.5 06/24/2016 0851   LYMPHSABS 2,940 07/20/2021 0935   LYMPHSABS 2.4 06/24/2016 0851   EOSABS 118 07/20/2021 0935   EOSABS 0.1 06/24/2016 0851   BASOSABS 50 07/20/2021 0935   BASOSABS 0.0 06/24/2016 0851    Hgb A1C Lab Results  Component Value Date   HGBA1C 5.9 (H) 07/20/2021           Assessment & Plan:   Preventative Health Maintenance:  She declines flu shot today She declines tetanus today Encouraged her to get her COVID booster Shingrix UTD She no longer needs Pap smears Mammogram UTD Bone density UTD Colon screening UTD Encouraged her to consume a balanced diet and exercise regimen Advised her to see an eye doctor and dentist annually We will check CBC, c-Met, lipid, A1c  RTC in 6 months, follow-up chronic conditions Webb Silversmith, NP

## 2022-02-18 NOTE — Assessment & Plan Note (Signed)
Encourage diet and exercise for weight loss 

## 2022-03-27 ENCOUNTER — Other Ambulatory Visit: Payer: Self-pay | Admitting: Internal Medicine

## 2022-03-27 DIAGNOSIS — Z0001 Encounter for general adult medical examination with abnormal findings: Secondary | ICD-10-CM

## 2022-03-29 NOTE — Telephone Encounter (Signed)
Requested Prescriptions  Pending Prescriptions Disp Refills   losartan (COZAAR) 100 MG tablet [Pharmacy Med Name: LOSARTAN '100MG'$  TABLETS] 90 tablet 0    Sig: TAKE 1 TABLET(100 MG) BY MOUTH DAILY     Cardiovascular:  Angiotensin Receptor Blockers Passed - 03/27/2022  7:06 AM      Passed - Cr in normal range and within 180 days    Creat  Date Value Ref Range Status  02/18/2022 0.56 0.50 - 1.05 mg/dL Final   Creatinine, Urine  Date Value Ref Range Status  07/23/2019 78 20 - 275 mg/dL Final         Passed - K in normal range and within 180 days    Potassium  Date Value Ref Range Status  02/18/2022 4.5 3.5 - 5.3 mmol/L Final         Passed - Patient is not pregnant      Passed - Last BP in normal range    BP Readings from Last 1 Encounters:  02/18/22 136/86         Passed - Valid encounter within last 6 months    Recent Outpatient Visits           1 month ago Encounter for general adult medical examination with abnormal findings   [facility] [place], Coralie Keens, NP   8 months ago Hyperlipidemia, unspecified hyperlipidemia type   [place], [place]   1 year ago Viral respiratory illness   [facility], Coralie Keens, NP   1 year ago Essential hypertension, malignant   [place], DO   1 year ago Upper respiratory tract infection, unspecified type   [facility], Lupita Raider, [place]

## 2022-05-25 ENCOUNTER — Telehealth (INDEPENDENT_AMBULATORY_CARE_PROVIDER_SITE_OTHER): Payer: Managed Care, Other (non HMO) | Admitting: Internal Medicine

## 2022-05-25 ENCOUNTER — Encounter: Payer: Self-pay | Admitting: Internal Medicine

## 2022-05-25 DIAGNOSIS — U071 COVID-19: Secondary | ICD-10-CM

## 2022-05-25 NOTE — Progress Notes (Signed)
Virtual Visit via Video Note  I connected with Lauren Cervantes on 05/25/22 at 11:20 AM EST by a video enabled telemedicine application and verified that I am speaking with the correct person using two identifiers.  Location: Patient: Home Provider: Office  Persons participating in this video call: Webb Silversmith, NP and Fredderick Severance   I discussed the limitations of evaluation and management by telemedicine and the availability of in person appointments. The patient expressed understanding and agreed to proceed.  History of Present Illness:  Patient reports fever, headache, nasal congestion, sore throat and cough.  This started 6 days ago.  The fever, headache, sore throat have resolved.  She is not blowing much mucus out of her nose.  The cough is intermittent.  She denies runny nose, ear pain, shortness of breath, chest pain, nausea, vomiting or diarrhea.  She denies chills or body aches.  She tested positive for COVID 4 days ago.She has tried Mucinex OTC with some relief of symptoms.  Past Medical History:  Diagnosis Date   Breast cancer ([place]) 2010   Hyperlipidemia    Hypertension    Lymphedema of arm    left arm.    Current Outpatient Medications  Medication Sig Dispense Refill   aspirin EC 81 MG tablet Take by mouth.     Calcium Carb-Cholecalciferol (CALCIUM 1000 + D) 1000-800 MG-UNIT TABS Take 1 tablet by mouth daily. Vitamin D '400mg'$  90 tablet 2   carvedilol (COREG) 25 MG tablet TAKE 1 TABLET(25 MG) BY MOUTH TWICE DAILY WITH A MEAL 180 tablet 1   cetirizine (ZYRTEC) 10 MG tablet Take 1 tablet (10 mg total) by mouth daily. 30 tablet 11   ezetimibe (ZETIA) 10 MG tablet TAKE 1 TABLET(10 MG) BY MOUTH DAILY 90 tablet 1   ibandronate (BONIVA) 150 MG tablet Take 1 tablet (150 mg total) by mouth every 30 (thirty) days. 3 tablet 1   icosapent Ethyl (VASCEPA) 1 g capsule Take 2 capsules (2 g total) by mouth 2 (two) times daily. 360 capsule 1   losartan (COZAAR) 100 MG tablet TAKE 1  TABLET(100 MG) BY MOUTH DAILY 90 tablet 0   Multiple Vitamins-Minerals (MULTIVITAMIN WITH MINERALS) tablet Take 1 tablet by mouth daily.     rosuvastatin (CRESTOR) 20 MG tablet TAKE 1 TABLET(20 MG) BY MOUTH AT BEDTIME 90 tablet 1   valACYclovir (VALTREX) 1000 MG tablet TAKE 2 TABLETS BY MOUTH 1 TIME. START OF A COLD SORE. TAKE 1 TABLET DAILY UNTIL FEVER BILSTER IS GONE OR FOR TOTAL OF 5 DAYS 20 tablet 1   No current facility-administered medications for this visit.    Allergies  Allergen Reactions   Codeine Rash   Hydrocodone-Acetaminophen Rash    Family History  Problem Relation Age of Onset   Hypertension Mother    Hypertension Father    Stroke Father    Cancer Other    Coronary artery disease Other    Stroke Other    Hyperlipidemia Other    Hypertension Other     Social History   Socioeconomic History   Marital status: Single    Spouse name: Not on file   Number of children: Not on file   Years of education: Not on file   Highest education level: Not on file  Occupational History   Occupation: Glass blower/designer and Ins Agent  Tobacco Use   Smoking status: Every Day    Packs/day: 0.05    Years: 30.00    Total pack years: 1.50  Types: Cigarettes   Smokeless tobacco: Never   Tobacco comments:    smkes 1-3 cigs a day   Vaping Use   Vaping Use: Former  Substance and Sexual Activity   Alcohol use: Yes    Alcohol/week: 2.0 standard drinks of alcohol    Types: 1 Glasses of wine, 1 Standard drinks or equivalent per week    Comment: 1 a day most days   Drug use: No   Sexual activity: Not on file  Other Topics Concern   Not on file  Social History Narrative   Regular exercise. Getting back into after cancer         Social Determinants of Health   Financial Resource Strain: Not on file  Food Insecurity: Not on file  Transportation Needs: Not on file  Physical Activity: Not on file  Stress: Not on file  Social Connections: Not on file  Intimate Partner  Violence: Not on file     Constitutional: Patient reports fever and headache.  Denies abrupt weight changes.  HEENT: Patient reports nasal congestion and sore throat.  Denies eye pain, eye redness, ear pain, ringing in the ears, wax buildup, runny nose, bloody nose. Respiratory: Patient reports cough.  Denies difficulty breathing, shortness of breath, or sputum production.   Cardiovascular: Denies chest pain, chest tightness, palpitations or swelling in the hands or feet.  Gastrointestinal: Denies abdominal pain, bloating, constipation, diarrhea or blood in the stool.  Musculoskeletal: Denies decrease in range of motion, difficulty with gait, muscle pain or joint pain and swelling.   No other specific complaints in a complete review of systems (except as listed in HPI above).    Observations/Objective:  There were no vitals taken for this visit. Wt Readings from Last 3 Encounters:  02/18/22 163 lb (73.9 kg)  07/20/21 154 lb (69.9 kg)  12/30/20 150 lb (68 kg)    General: Appears her stated age, appears unwell but in NAD. HEENT: Head: normal shape and size;  Nose: Slight congestion noted; Throat/Mouth: No hoarseness noted Pulmonary/Chest: Normal effort. No respiratory distress.  Neurological: Alert and oriented.   BMET    Component Value Date/Time   NA 140 02/18/2022 0824   NA 143 05/12/2017 0835   K 4.5 02/18/2022 0824   CL 104 02/18/2022 0824   CO2 27 02/18/2022 0824   GLUCOSE 100 (H) 02/18/2022 0824   BUN 13 02/18/2022 0824   BUN 15 05/12/2017 0835   CREATININE 0.56 02/18/2022 0824   CALCIUM 9.6 02/18/2022 0824   GFRNONAA 104 07/23/2019 0847   GFRAA 120 07/23/2019 0847    Lipid Panel     Component Value Date/Time   CHOL 144 02/18/2022 0824   CHOL 125 05/12/2017 0835   TRIG 318 (H) 02/18/2022 0824   HDL 39 (L) 02/18/2022 0824   HDL 35 (L) 05/12/2017 0835   CHOLHDL 3.7 02/18/2022 0824   VLDL NOT CALC mg/dL 03/23/2010 2120   LDLCALC 67 02/18/2022 0824    CBC     Component Value Date/Time   WBC 8.4 07/20/2021 0935   RBC 4.16 07/20/2021 0935   HGB 13.9 07/20/2021 0935   HGB 13.6 06/24/2016 0851   HCT 41.2 07/20/2021 0935   HCT 40.8 06/24/2016 0851   PLT 186 07/20/2021 0935   PLT 185 06/24/2016 0851   MCV 99.0 07/20/2021 0935   MCV 95 06/24/2016 0851   MCH 33.4 (H) 07/20/2021 0935   MCHC 33.7 07/20/2021 0935   RDW 12.8 07/20/2021 0935   RDW 14.5  06/24/2016 0851   LYMPHSABS 2,940 07/20/2021 0935   LYMPHSABS 2.4 06/24/2016 0851   EOSABS 118 07/20/2021 0935   EOSABS 0.1 06/24/2016 0851   BASOSABS 50 07/20/2021 0935   BASOSABS 0.0 06/24/2016 0851    Hgb A1C Lab Results  Component Value Date   HGBA1C 6.2 (A) 02/18/2022       Assessment and Plan:  COVID-19:  Encourage rest and fluids Her symptoms are improving She is outside the window for oral antiviral therapy so we will hold off at this time Discussed symptomatic treatment She thought about requesting prednisone to prevent bronchitis or pneumonia because she is a smoker however her symptoms are mild and seem to be improving so we will hold off at this time  RTC in 3 months for follow-up of chronic conditions  Follow Up Instructions:    I discussed the assessment and treatment plan with the patient. The patient was provided an opportunity to ask questions and all were answered. The patient agreed with the plan and demonstrated an understanding of the instructions.   The patient was advised to call back or seek an in-person evaluation if the symptoms worsen or if the condition fails to improve as anticipated.    Webb Silversmith, NP

## 2022-05-25 NOTE — Patient Instructions (Signed)

## 2022-05-26 ENCOUNTER — Encounter: Payer: Self-pay | Admitting: Internal Medicine

## 2022-05-26 DIAGNOSIS — Z0001 Encounter for general adult medical examination with abnormal findings: Secondary | ICD-10-CM

## 2022-05-26 MED ORDER — VASCEPA 1 G PO CAPS: 2.0000 g | ORAL_CAPSULE | Freq: Two times a day (BID) | ORAL | 1 refills | Status: DC
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
## 2022-06-25 ENCOUNTER — Other Ambulatory Visit: Payer: Self-pay | Admitting: Internal Medicine

## 2022-06-25 DIAGNOSIS — Z0001 Encounter for general adult medical examination with abnormal findings: Secondary | ICD-10-CM

## 2022-06-25 NOTE — Telephone Encounter (Signed)
Requested medication (s) are due for refill today - no  Requested medication (s) are on the active medication list -yes  Future visit scheduled -no  Last refill: 05/26/22 #360 1RF  Notes to clinic: off protocol- provider review - possible change in pharmacy too soon  Requested Prescriptions  Pending Prescriptions Disp Refills   VASCEPA 1 g capsule [Pharmacy Med Name: VASCEPA 1GM CAPSULES] 360 capsule 1    Sig: TAKE 2 CAPSULES(2 GRAMS) BY MOUTH TWICE DAILY     Off-Protocol Failed - 06/25/2022  7:07 AM      Failed - Medication not assigned to a protocol, review manually.      Passed - Valid encounter within last 12 months    Recent Outpatient Visits           1 month ago [facility] [place], Coralie Keens, NP   4 months ago Encounter for general adult medical examination with abnormal findings   [facility] [place], Coralie Keens, NP   11 months ago Hyperlipidemia, unspecified hyperlipidemia type   [place], PA-C   1 year ago Viral respiratory illness   [facility], Coralie Keens, NP   1 year ago Essential hypertension, malignant   [place], DO                 Requested Prescriptions  Pending Prescriptions Disp Refills   VASCEPA 1 g capsule [Pharmacy Med Name: VASCEPA 1GM CAPSULES] 360 capsule 1    Sig: TAKE 2 CAPSULES(2 GRAMS) BY MOUTH TWICE DAILY     Off-Protocol Failed - 06/25/2022  7:07 AM      Failed - Medication not assigned to a protocol, review manually.      Passed - Valid encounter within last 12 months    Recent Outpatient Visits           1 month ago [facility] [place], Coralie Keens, NP   4 months ago Encounter for general adult medical examination with abnormal findings   [facility] [place], Coralie Keens, NP   11  months ago Hyperlipidemia, unspecified hyperlipidemia type   [place], PA-C   1 year ago Viral respiratory illness   [facility] [place], Coralie Keens, NP   1 year ago Essential hypertension, malignant   [facility], Devonne [place]ought[place], [place]

## 2022-07-21 ENCOUNTER — Ambulatory Visit (INDEPENDENT_AMBULATORY_CARE_PROVIDER_SITE_OTHER): Payer: Managed Care, Other (non HMO) | Admitting: Internal Medicine

## 2022-07-21 ENCOUNTER — Encounter: Payer: Self-pay | Admitting: Internal Medicine

## 2022-07-21 VITALS — BP 134/82 | HR 65 | Temp 96.5°F | Wt 163.0 lb

## 2022-07-21 DIAGNOSIS — S61210A Laceration without foreign body of right index finger without damage to nail, initial encounter: Secondary | ICD-10-CM | POA: Diagnosis not present

## 2022-07-21 DIAGNOSIS — Z23 Encounter for immunization: Secondary | ICD-10-CM | POA: Diagnosis not present

## 2022-07-21 MED ORDER — FLUCONAZOLE 150 MG PO TABS: 150.0000 mg | ORAL_TABLET | Freq: Once | ORAL | 0 refills | Status: AC

## 2022-07-21 MED ORDER — CEPHALEXIN 500 MG PO CAPS: 500.0000 mg | ORAL_CAPSULE | Freq: Three times a day (TID) | ORAL | 0 refills | Status: DC

## 2022-07-21 NOTE — Patient Instructions (Signed)
Laceration Care, Adult A laceration is a cut that may go through all layers of the skin and into the tissue that is right under the skin. Some lacerations heal on their own. Others need to be closed with stitches (sutures), staples, skin adhesive strips, or skin glue. Proper care of a laceration reduces the risk for infection, helps the laceration heal better, and may prevent scarring. General tips Keep the wound clean and dry. Do not scratch or pick at the wound. Wash your hands with soap and water for at least 20 seconds before and after touching your wound or changing your bandage (dressing). If soap and water are not available, use hand sanitizer. Do not usedisinfectants or antiseptics, such as rubbing alcohol, to clean your wound unless told by your health care provider. If you were given a dressing, you should change it at least once a day, or as told by your health care provider. You should also change it if it becomes wet or dirty. How to care for your laceration If sutures or staples were used: Keep the wound completely dry for the first 24 hours, or as told by your health care provider. After that time, you may shower or bathe. Do not soak your wound in water until after the sutures or staples have been removed. Clean the wound once each day, or as told by your health care provider. To do this: Wash the wound with soap and water. Rinse the wound with water to remove all soap. Pat the wound dry with a clean towel. Do not rub the wound. After cleaning the wound, apply a thin layer of antibiotic ointment, other topical ointments, or a non-adherent dressing as told by your health care provider. This will help prevent infection and keep the dressing from sticking to the wound. Have the sutures or staples removed as told by your health care provider. Do not  remove sutures or staples yourself. If skin adhesive strips were used: Do not get the skin adhesive strips wet. You may shower or bathe,  but keep the wound dry. If the wound gets wet, pat it dry with a clean towel. Do not rub the wound. Skin adhesive strips fall off on their own. If adhesive strip edges start to loosen and curl up, you may trim the loose edges. Do not remove adhesive strips completely unless your health care provider tells you to do that. If skin glue was used: You may shower or bathe, but try to keep the wound dry. Do not soak the wound in water. After showering or bathing, pat the wound dry with a clean towel. Do not rub the wound. Do not do any activities that will make you sweat a lot until the skin glue has fallen off. Do not apply liquid, cream, or ointment medicine to the wound while the skin glue is in place. Doing this may loosen the film before the wound has healed. If a dressing is placed over the wound, do not apply tape directly over the skin glue. Doing this may cause the glue to be pulled off before the wound has healed. Do not pick at the glue. Skin glue usually remains in place for 5-10 days and then falls off the skin. Follow these instructions at home: Medicines Take over-the-counter and prescription medicines only as told by your health care provider. If you were prescribed an antibiotic medicine or ointment, take or apply it as told by your health care provider. Do not stop using it even if   your condition improves. Managing pain and swelling If directed, put ice on the injured area. To do this: Put ice in a plastic bag. Place a towel between your skin and the bag. Leave the ice on for 20 minutes, 2-3 times a day. Remove the ice if your skin turns bright red. This is very important. If you cannot feel pain, heat, or cold, you have a greater risk of damage to the area. Raise (elevate) the injured area above the level of your heart while you are sitting or lying down for the first 24-48 hours after the laceration is repaired. General instructions  Avoid any activity that could cause your wound  to reopen. Check your wound every day for signs of infection. Watch for: More redness, swelling, or pain. Fluid or blood. Warmth. Pus or a bad smell. Keep all follow-up visits. This is important. Contact a health care provider if: You received a tetanus shot and you have swelling, severe pain, redness, or bleeding at the injection site. Your closed wound breaks open. You have any of these signs of infection: More redness, swelling, or pain around your wound. Fluid or blood coming from your wound. Warmth coming from your wound. Pus or a bad smell coming from your wound. A fever. You notice something coming out of the wound, such as wood or glass. Your pain is not controlled with medicine. You notice a change in the color of your skin near your wound. You need to change the dressing often. You develop a new rash. You have numbness around the wound. Get help right away if: You develop severe swelling around the wound. Your pain suddenly increases and is severe. You develop painful lumps near the wound or on skin anywhere else on your body. You have a red streak going away from your wound. The wound is on your hand or foot, and you cannot properly move a finger or toe. The wound is on your hand or foot, and you notice that your fingers or toes look pale or bluish. Summary A laceration is a cut that may go through all layers of the skin and into the tissue that is right under the skin. Some lacerations heal on their own. Others need to be closed with stitches (sutures), staples, skin adhesive strips, or skin glue. Proper care of a laceration reduces the risk of infection, helps the laceration heal better, and may prevent scarring. This information is not intended to replace advice given to you by your health care provider. Make sure you discuss any questions you have with your health care provider. Document Revised: 06/19/2020 Document Reviewed: 06/19/2020 Elsevier Patient Education   2023 Elsevier Inc.  

## 2022-07-21 NOTE — Progress Notes (Signed)
Subjective:    Patient ID: Lauren Cervantes, female    DOB: [birth year], 67 y.o.   MRN: [id number]  HPI  Pt presents to the clinic today with c/o a laceration of her right finger.  She reports this occurred last night around 8 PM on a sliding glass door.  She did have some difficulty controlling the bleeding.  She is not on blood thinners.  She is requesting a tetanus shot.  Review of Systems     Past Medical History:  Diagnosis Date   Breast cancer ([place]) 2010   Hyperlipidemia    Hypertension    Lymphedema of arm    left arm.    Current Outpatient Medications  Medication Sig Dispense Refill   aspirin EC 81 MG tablet Take by mouth.     Calcium Carb-Cholecalciferol (CALCIUM 1000 + D) 1000-800 MG-UNIT TABS Take 1 tablet by mouth daily. Vitamin D 400mg  90 tablet 2   carvedilol (COREG) 25 MG tablet TAKE 1 TABLET(25 MG) BY MOUTH TWICE DAILY WITH A MEAL 180 tablet 1   cetirizine (ZYRTEC) 10 MG tablet Take 1 tablet (10 mg total) by mouth daily. 30 tablet 11   ezetimibe (ZETIA) 10 MG tablet TAKE 1 TABLET(10 MG) BY MOUTH DAILY 90 tablet 1   ibandronate (BONIVA) 150 MG tablet Take 1 tablet (150 mg total) by mouth every 30 (thirty) days. 3 tablet 1   losartan (COZAAR) 100 MG tablet TAKE 1 TABLET(100 MG) BY MOUTH DAILY 90 tablet 0   Multiple Vitamins-Minerals (MULTIVITAMIN WITH MINERALS) tablet Take 1 tablet by mouth daily.     rosuvastatin (CRESTOR) 20 MG tablet TAKE 1 TABLET(20 MG) BY MOUTH AT BEDTIME 90 tablet 1   valACYclovir (VALTREX) 1000 MG tablet TAKE 2 TABLETS BY MOUTH 1 TIME. START OF A COLD SORE. TAKE 1 TABLET DAILY UNTIL FEVER BILSTER IS GONE OR FOR TOTAL OF 5 DAYS 20 tablet 1   VASCEPA 1 g capsule Take 2 capsules (2 g total) by mouth 2 (two) times daily. 360 capsule 1   No current facility-administered medications for this visit.    Allergies  Allergen Reactions   Codeine Rash   Hydrocodone-Acetaminophen Rash    Family History  Problem Relation Age of Onset    Hypertension Mother    Hypertension Father    Stroke Father    Cancer Other    Coronary artery disease Other    Stroke Other    Hyperlipidemia Other    Hypertension Other     Social History   Socioeconomic History   Marital status: Single    Spouse name: Not on file   Number of children: Not on file   Years of education: Not on file   Highest education level: Not on file  Occupational History   Occupation: Glass blower/designer and Ins Agent  Tobacco Use   Smoking status: Every Day    Packs/day: 0.05    Years: 30.00    Additional pack years: 0.00    Total pack years: 1.50    Types: Cigarettes   Smokeless tobacco: Never   Tobacco comments:    smkes 1-3 cigs a day   Vaping Use   Vaping Use: Former  Substance and Sexual Activity   Alcohol use: Yes    Alcohol/week: 2.0 standard drinks of alcohol    Types: 1 Glasses of wine, 1 Standard drinks or equivalent per week    Comment: 1 a day most days   Drug use: No   Sexual activity:  Not on file  Other Topics Concern   Not on file  Social History Narrative   Regular exercise. Getting back into after cancer         Social Determinants of Health   Financial Resource Strain: Not on file  Food Insecurity: Not on file  Transportation Needs: Not on file  Physical Activity: Not on file  Stress: Not on file  Social Connections: Not on file  Intimate Partner Violence: Not on file     Constitutional: Denies fever, malaise, fatigue, headache or abrupt weight changes.  Respiratory: Denies difficulty breathing, shortness of breath, cough or sputum production.   Cardiovascular: Denies chest pain, chest tightness, palpitations or swelling in the hands or feet.  Musculoskeletal: Denies decrease in range of motion, difficulty with gait, muscle pain or joint pain and swelling.  Skin: Pt reports laceration of right index finger finger. Denies redness, rashes, lesions or ulcercations.  Neurological: Denies numbness, tingling, weakness or  problems with balance and coordination.    No other specific complaints in a complete review of systems (except as listed in HPI above).  Objective:   Physical Exam  BP 134/82 (BP Location: Right Arm, Patient Position: Sitting, Cuff Size: Normal)   Pulse 65   Temp (!) 96.5 F (35.8 C) (Temporal)   Wt 163 lb (73.9 kg)   SpO2 100%   BMI 26.31 kg/m   Wt Readings from Last 3 Encounters:  02/18/22 163 lb (73.9 kg)  07/20/21 154 lb (69.9 kg)  12/30/20 150 lb (68 kg)    General: Appears her stated age, overweight in NAD. Skin: 1 cm horseshoe shaped laceration noted starting from the center portion of the pad of the right index finger extending around to the medial aspect of the right index finger.  2 mm deep.  Not bleeding at this time. Cardiovascular: Normal rate and rhythm.  Pulmonary/Chest: Normal effort and positive vesicular breath sounds. No respiratory distress. No wheezes, rales or ronchi noted.  Musculoskeletal: Normal flexion and extension of the right index finger. Neurological: Alert and oriented.     BMET    Component Value Date/Time   NA 140 02/18/2022 0824   NA 143 05/12/2017 0835   K 4.5 02/18/2022 0824   CL 104 02/18/2022 0824   CO2 27 02/18/2022 0824   GLUCOSE 100 (H) 02/18/2022 0824   BUN 13 02/18/2022 0824   BUN 15 05/12/2017 0835   CREATININE 0.56 02/18/2022 0824   CALCIUM 9.6 02/18/2022 0824   GFRNONAA 104 07/23/2019 0847   GFRAA 120 07/23/2019 0847    Lipid Panel     Component Value Date/Time   CHOL 144 02/18/2022 0824   CHOL 125 05/12/2017 0835   TRIG 318 (H) 02/18/2022 0824   HDL 39 (L) 02/18/2022 0824   HDL 35 (L) 05/12/2017 0835   CHOLHDL 3.7 02/18/2022 0824   VLDL NOT CALC mg/dL 03/23/2010 2120   LDLCALC 67 02/18/2022 0824    CBC    Component Value Date/Time   WBC 8.4 07/20/2021 0935   RBC 4.16 07/20/2021 0935   HGB 13.9 07/20/2021 0935   HGB 13.6 06/24/2016 0851   HCT 41.2 07/20/2021 0935   HCT 40.8 06/24/2016 0851   PLT 186  07/20/2021 0935   PLT 185 06/24/2016 0851   MCV 99.0 07/20/2021 0935   MCV 95 06/24/2016 0851   MCH 33.4 (H) 07/20/2021 0935   MCHC 33.7 07/20/2021 0935   RDW 12.8 07/20/2021 0935   RDW 14.5 06/24/2016 0851  LYMPHSABS 2,940 07/20/2021 0935   LYMPHSABS 2.4 06/24/2016 0851   EOSABS 118 07/20/2021 0935   EOSABS 0.1 06/24/2016 0851   BASOSABS 50 07/20/2021 0935   BASOSABS 0.0 06/24/2016 0851    Hgb A1C Lab Results  Component Value Date   HGBA1C 6.2 (A) 02/18/2022            Assessment & Plan:   Laceration of Right Index Finger:  She could use 2 stitches however she would like to avoid this if at all possible Wound redressed in office Rx for Keflex 500 mg 3 times daily x 5 days Tdap given  RTC in 1 month for follow-up of chronic conditions Lauren Silversmith, NP

## 2022-08-09 ENCOUNTER — Other Ambulatory Visit: Payer: Self-pay | Admitting: Internal Medicine

## 2022-08-09 DIAGNOSIS — Z0001 Encounter for general adult medical examination with abnormal findings: Secondary | ICD-10-CM

## 2022-08-10 NOTE — Telephone Encounter (Signed)
Requested Prescriptions  Pending Prescriptions Disp Refills   losartan (COZAAR) 100 MG tablet [Pharmacy Med Name: LOSARTAN POTASSIUM 100 MG TAB] 90 tablet 0    Sig: TAKE 1 TABLET BY MOUTH EVERY DAY     Cardiovascular:  Angiotensin Receptor Blockers Passed - 08/09/2022  2:13 AM      Passed - Cr in normal range and within 180 days    Creat  Date Value Ref Range Status  02/18/2022 0.56 0.50 - 1.05 mg/dL Final   Creatinine, Urine  Date Value Ref Range Status  07/23/2019 78 20 - 275 mg/dL Final         Passed - K in normal range and within 180 days    Potassium  Date Value Ref Range Status  02/18/2022 4.5 3.5 - 5.3 mmol/L Final         Passed - Patient is not pregnant      Passed - Last BP in normal range    BP Readings from Last 1 Encounters:  07/21/22 134/82         Passed - Valid encounter within last 6 months    Recent Outpatient Visits           2 weeks ago Laceration of right index finger without foreign body without damage to nail, initial encounter   [place] [facility] [place], Salvadore Oxford, NP   2 months ago COVID-19   [facility] [place], Salvadore Oxford, NP   5 months ago Encounter for general adult medical examination with abnormal findings   [place] [facility] [place], Salvadore Oxford, NP   1 year ago Hyperlipidemia, unspecified hyperlipidemia type   [place] [place], Oswaldo Conroy, [place]   1 year ago Viral respiratory illness   [facility] [facility] [place], Salvadore Oxford, [place]

## 2022-08-21 ENCOUNTER — Other Ambulatory Visit: Payer: Self-pay | Admitting: Internal Medicine

## 2022-08-21 DIAGNOSIS — Z0001 Encounter for general adult medical examination with abnormal findings: Secondary | ICD-10-CM

## 2022-08-23 NOTE — Telephone Encounter (Signed)
Requested Prescriptions  Pending Prescriptions Disp Refills   ezetimibe (ZETIA) 10 MG tablet [Pharmacy Med Name: EZETIMIBE 10 MG TABLET] 90 tablet 0    Sig: TAKE 1 TABLET BY MOUTH EVERY DAY     Cardiovascular:  Antilipid - Sterol Transport Inhibitors Failed - 08/21/2022  1:11 AM      Failed - Lipid Panel in normal range within the last 12 months    Cholesterol, Total  Date Value Ref Range Status  05/12/2017 125 100 - 199 mg/dL Final   Cholesterol  Date Value Ref Range Status  02/18/2022 144 <200 mg/dL Final   LDL Cholesterol (Calc)  Date Value Ref Range Status  02/18/2022 67 mg/dL (calc) Final    Comment:    Reference range: <100 . Desirable range <100 mg/dL for primary prevention;   <70 mg/dL for patients with CHD or diabetic patients  with > or = 2 CHD risk factors. Marland Kitchen LDL-C is now calculated using the Martin-Hopkins  calculation, which is a validated novel method providing  better accuracy than the Friedewald equation in the  estimation of LDL-C.  Horald Pollen et al. Lenox Ahr. 1610;960(45): 2061-2068  (http://education.[employer].com/faq/FAQ164)    HDL  Date Value Ref Range Status  02/18/2022 39 (L) > OR = 50 mg/dL Final  40/98/1191 35 (L) >39 mg/dL Final   Triglycerides  Date Value Ref Range Status  02/18/2022 318 (H) <150 mg/dL Final    Comment:    . If a non-fasting specimen was collected, consider repeat triglyceride testing on a fasting specimen if clinically indicated.  Perry Mount et al. J. of Clin. Lipidol. 2015;9:129-169. Marland Kitchen          Passed - AST in normal range and within 360 days    AST  Date Value Ref Range Status  02/18/2022 16 10 - 35 U/L Final         Passed - ALT in normal range and within 360 days    ALT  Date Value Ref Range Status  02/18/2022 23 6 - 29 U/L Final         Passed - Patient is not pregnant      Passed - Valid encounter within last 12 months    Recent Outpatient Visits           1 month ago Laceration of right index  finger without foreign body without damage to nail, initial encounter   [facility] [place], Salvadore Oxford, NP   3 months ago COVID-19   [facility] [place], Salvadore Oxford, NP   6 months ago Encounter for general adult medical examination with abnormal findings   [place] [facility] [place], Salvadore Oxford, NP   1 year ago Hyperlipidemia, unspecified hyperlipidemia type    [facility] [place], Oswaldo Conroy, [place]   1 year ago Viral respiratory illness    [facility] [place], [place], NP               ibandronate (BONIVA) 150 MG tablet [Pharmacy Med Name: IBANDRONATE SODIUM 150 MG TAB] 3 tablet 0    Sig: TAKE 1 TABLET (150 MG TOTAL) BY MOUTH EVERY 30 (THIRTY) DAYS.     Endocrinology:  Bisphosphonates Failed - 08/21/2022  1:11 AM      Failed - Vitamin D in normal range and within 360 days    No results found for: "YN8295AO1", "HY8657QI6", "VD125OH2TOT", "25OHVITD3", "25OHVITD2", "25OHVITD1", "VD25OH"  Failed - Mg Level in normal range and within 360 days    No results found for: "MG"       Failed - Phosphate in normal range and within 360 days    No results found for: "PHOS"       Failed - Bone Mineral Density or Dexa Scan completed in the last 2 years      Passed - Ca in normal range and within 360 days    Calcium  Date Value Ref Range Status  02/18/2022 9.6 8.6 - 10.4 mg/dL Final         Passed - Cr in normal range and within 360 days    Creat  Date Value Ref Range Status  02/18/2022 0.56 0.50 - 1.05 mg/dL Final   Creatinine, Urine  Date Value Ref Range Status  07/23/2019 78 20 - 275 mg/dL Final         Passed - eGFR is 30 or above and within 360 days    GFR, Est African American  Date Value Ref Range Status  07/23/2019 120 > OR = 60 mL/min/1.21m2 Final   GFR, Est Non African American  Date Value Ref Range Status  07/23/2019 104 > OR = 60 mL/min/1.72m2 Final    eGFR  Date Value Ref Range Status  02/18/2022 102 > OR = 60 mL/min/1.67m2 Final         Passed - Valid encounter within last 12 months    Recent Outpatient Visits           1 month ago Laceration of right index finger without foreign body without damage to nail, initial encounter   [place] [facility] [place], Salvadore Oxford, NP   3 months ago COVID-19   [facility] [place], Salvadore Oxford, NP   6 months ago Encounter for general adult medical examination with abnormal findings   [place] [facility] [place], Salvadore Oxford, NP   1 year ago Hyperlipidemia, unspecified hyperlipidemia type   [place] [facility] [place], Oswaldo Conroy, [place]   1 year ago Viral respiratory illness   [place] [facility] [place], Salvadore Oxford, [place]

## 2022-08-25 ENCOUNTER — Other Ambulatory Visit: Payer: Self-pay | Admitting: Internal Medicine

## 2022-08-25 DIAGNOSIS — Z0001 Encounter for general adult medical examination with abnormal findings: Secondary | ICD-10-CM

## 2022-08-26 NOTE — Telephone Encounter (Signed)
Requested Prescriptions  Pending Prescriptions Disp Refills   valACYclovir (VALTREX) 1000 MG tablet [Pharmacy Med Name: VALACYCLOVIR HCL 1 GRAM TABLET] 90 tablet 0    Sig: TAKE 2 TABLETS BY MOUTH 1 TIME. START OF A COLD SORE. TAKE 1 TABLET DAILY UNTIL FEVER BILSTER IS GONE OR FOR TOTAL OF 5 DAYS     Antimicrobials:  Antiviral Agents - Anti-Herpetic Passed - 08/25/2022  2:17 PM      Passed - Valid encounter within last 12 months    Recent Outpatient Visits           1 month ago Laceration of right index finger without foreign body without damage to nail, initial encounter   [place] [facility] [place], Salvadore Oxford, NP   3 months ago COVID-19   [facility] [place], Salvadore Oxford, NP   6 months ago Encounter for general adult medical examination with abnormal findings   [place] [facility] [place], Salvadore Oxford, NP   1 year ago Hyperlipidemia, unspecified hyperlipidemia type   [place] [facility] [place], Oswaldo Conroy, [place]   1 year ago Viral respiratory illness   [place] [facility] [place], Salvadore Oxford, [place]

## 2022-11-02 LAB — HM MAMMOGRAPHY

## 2022-11-07 ENCOUNTER — Other Ambulatory Visit: Payer: Self-pay | Admitting: Internal Medicine

## 2022-11-07 DIAGNOSIS — Z0001 Encounter for general adult medical examination with abnormal findings: Secondary | ICD-10-CM

## 2022-11-08 NOTE — Telephone Encounter (Signed)
Patient will need an office visit for further refills. Requested Prescriptions  Pending Prescriptions Disp Refills   losartan (COZAAR) 100 MG tablet [Pharmacy Med Name: LOSARTAN POTASSIUM 100 MG TAB] 90 tablet 0    Sig: TAKE 1 TABLET BY MOUTH EVERY DAY     Cardiovascular:  Angiotensin Receptor Blockers Failed - 11/07/2022  1:24 AM      Failed - Cr in normal range and within 180 days    Creat  Date Value Ref Range Status  02/18/2022 0.56 0.50 - 1.05 mg/dL Final   Creatinine, Urine  Date Value Ref Range Status  07/23/2019 78 20 - 275 mg/dL Final         Failed - K in normal range and within 180 days    Potassium  Date Value Ref Range Status  02/18/2022 4.5 3.5 - 5.3 mmol/L Final         Passed - Patient is not pregnant      Passed - Last BP in normal range    BP Readings from Last 1 Encounters:  07/21/22 134/82         Passed - Valid encounter within last 6 months    Recent Outpatient Visits           3 months ago Laceration of right index finger without foreign body without damage to nail, initial encounter   [place] [facility] [place], Salvadore Oxford, NP   5 months ago COVID-19   [facility] [place], Salvadore Oxford, NP   8 months ago Encounter for general adult medical examination with abnormal findings   [place] [facility] [place], Salvadore Oxford, NP   1 year ago Hyperlipidemia, unspecified hyperlipidemia type   [facility] [place], Oswaldo Conroy, [place]   1 year ago Viral respiratory illness   [place] [place], Salvadore Oxford, [place]

## 2022-11-18 ENCOUNTER — Other Ambulatory Visit: Payer: Self-pay | Admitting: Internal Medicine

## 2022-11-18 DIAGNOSIS — Z0001 Encounter for general adult medical examination with abnormal findings: Secondary | ICD-10-CM

## 2022-11-18 NOTE — Telephone Encounter (Signed)
Requested medication (s) are due for refill today: yes  Requested medication (s) are on the active medication list: yes  Last refill:  zetia - 08/23/22 #90 0 refills, boniva- 08/23/22 #3 0 refills   Future visit scheduled: no   Notes to clinic:  no refills remain. Do you want to refill Rxs?     Requested Prescriptions  Pending Prescriptions Disp Refills   ezetimibe (ZETIA) 10 MG tablet [Pharmacy Med Name: EZETIMIBE 10 MG TABLET] 90 tablet 0    Sig: TAKE 1 TABLET BY MOUTH EVERY DAY     Cardiovascular:  Antilipid - Sterol Transport Inhibitors Failed - 11/18/2022  2:57 AM      Failed - Lipid Panel in normal range within the last 12 months    Cholesterol, Total  Date Value Ref Range Status  05/12/2017 125 100 - 199 mg/dL Final   Cholesterol  Date Value Ref Range Status  02/18/2022 144 <200 mg/dL Final   LDL Cholesterol (Calc)  Date Value Ref Range Status  02/18/2022 67 mg/dL (calc) Final    Comment:    Reference range: <100 . Desirable range <100 mg/dL for primary prevention;   <70 mg/dL for patients with CHD or diabetic patients  with > or = 2 CHD risk factors. Marland Kitchen LDL-C is now calculated using the Martin-Hopkins  calculation, which is a validated novel method providing  better accuracy than the Friedewald equation in the  estimation of LDL-C.  Horald Pollen et al. Lenox Ahr. 6962;952(84): 2061-2068  (http://education.[facility].com/faq/FAQ164)    HDL  Date Value Ref Range Status  02/18/2022 39 (L) > OR = 50 mg/dL Final  13/24/4010 35 (L) >39 mg/dL Final   Triglycerides  Date Value Ref Range Status  02/18/2022 318 (H) <150 mg/dL Final    Comment:    . If a non-fasting specimen was collected, consider repeat triglyceride testing on a fasting specimen if clinically indicated.  Perry Mount et al. J. of Clin. Lipidol. 2015;9:129-169. Marland Kitchen          Passed - AST in normal range and within 360 days    AST  Date Value Ref Range Status  02/18/2022 16 10 - 35 U/L Final          Passed - ALT in normal range and within 360 days    ALT  Date Value Ref Range Status  02/18/2022 23 6 - 29 U/L Final         Passed - Patient is not pregnant      Passed - Valid encounter within last 12 months    Recent Outpatient Visits           4 months ago Laceration of right index finger without foreign body without damage to nail, initial encounter   [place] [facility] [place], Salvadore Oxford, NP   5 months ago COVID-19   [facility] [place], Salvadore Oxford, NP   9 months ago Encounter for general adult medical examination with abnormal findings   [place] [facility] [place], Salvadore Oxford, NP   1 year ago Hyperlipidemia, unspecified hyperlipidemia type   [facility] [facility] [place], Oswaldo Conroy, [place]   1 year ago Viral respiratory illness   [facility] [place], Salvadore Oxford, NP               ibandronate (BONIVA) 150 MG tablet [Pharmacy Med Name: IBANDRONATE SODIUM 150 MG TAB] 3 tablet 0  Sig: TAKE 1 TABLET (150 MG TOTAL) BY MOUTH EVERY 30 (THIRTY) DAYS.     Endocrinology:  Bisphosphonates Failed - 11/18/2022  2:57 AM      Failed - Vitamin D in normal range and within 360 days    No results found for: "WU9811BJ4", "NW2956OZ3", "VD125OH2TOT", "25OHVITD3", "25OHVITD2", "25OHVITD1", "VD25OH"       Failed - Mg Level in normal range and within 360 days    No results found for: "MG"       Failed - Phosphate in normal range and within 360 days    No results found for: "PHOS"       Failed - Bone Mineral Density or Dexa Scan completed in the last 2 years      Passed - Ca in normal range and within 360 days    Calcium  Date Value Ref Range Status  02/18/2022 9.6 8.6 - 10.4 mg/dL Final         Passed - Cr in normal range and within 360 days    Creat  Date Value Ref Range Status  02/18/2022 0.56 0.50 - 1.05 mg/dL Final   Creatinine, Urine  Date Value Ref Range Status  07/23/2019  78 20 - 275 mg/dL Final         Passed - eGFR is 30 or above and within 360 days    GFR, Est African American  Date Value Ref Range Status  07/23/2019 120 > OR = 60 mL/min/1.7m2 Final   GFR, Est Non African American  Date Value Ref Range Status  07/23/2019 104 > OR = 60 mL/min/1.63m2 Final   eGFR  Date Value Ref Range Status  02/18/2022 102 > OR = 60 mL/min/1.40m2 Final         Passed - Valid encounter within last 12 months    Recent Outpatient Visits           4 months ago Laceration of right index finger without foreign body without damage to nail, initial encounter   [place] [facility] [place], Salvadore Oxford, NP   5 months ago COVID-19   [facility] [place], Salvadore Oxford, NP   9 months ago Encounter for general adult medical examination with abnormal findings   [place] [facility] [place], Salvadore Oxford, NP   1 year ago Hyperlipidemia, unspecified hyperlipidemia type   [place] [facility] [place], Oswaldo Conroy, [place]   1 year ago Viral respiratory illness   [place] [facility] [place], Salvadore Oxford, [place]

## 2022-12-20 ENCOUNTER — Encounter: Payer: Self-pay | Admitting: Internal Medicine

## 2022-12-20 ENCOUNTER — Ambulatory Visit (INDEPENDENT_AMBULATORY_CARE_PROVIDER_SITE_OTHER): Payer: Managed Care, Other (non HMO) | Admitting: Internal Medicine

## 2022-12-20 VITALS — BP 132/84 | HR 69 | Temp 95.5°F | Wt 159.0 lb

## 2022-12-20 DIAGNOSIS — I10 Essential (primary) hypertension: Secondary | ICD-10-CM | POA: Diagnosis not present

## 2022-12-20 DIAGNOSIS — Z1211 Encounter for screening for malignant neoplasm of colon: Secondary | ICD-10-CM | POA: Diagnosis not present

## 2022-12-20 DIAGNOSIS — G43019 Migraine without aura, intractable, without status migrainosus: Secondary | ICD-10-CM

## 2022-12-20 DIAGNOSIS — R7303 Prediabetes: Secondary | ICD-10-CM | POA: Diagnosis not present

## 2022-12-20 DIAGNOSIS — B001 Herpesviral vesicular dermatitis: Secondary | ICD-10-CM

## 2022-12-20 DIAGNOSIS — E663 Overweight: Secondary | ICD-10-CM

## 2022-12-20 DIAGNOSIS — Z853 Personal history of malignant neoplasm of breast: Secondary | ICD-10-CM

## 2022-12-20 DIAGNOSIS — Z6825 Body mass index (BMI) 25.0-25.9, adult: Secondary | ICD-10-CM

## 2022-12-20 DIAGNOSIS — M810 Age-related osteoporosis without current pathological fracture: Secondary | ICD-10-CM

## 2022-12-20 DIAGNOSIS — E782 Mixed hyperlipidemia: Secondary | ICD-10-CM

## 2022-12-20 MED ORDER — VASCEPA 1 G PO CAPS
2.0000 g | ORAL_CAPSULE | Freq: Two times a day (BID) | ORAL | 1 refills | Status: DC
Start: 2022-12-20 — End: 2023-04-12

## 2022-12-20 MED ORDER — LOSARTAN POTASSIUM 100 MG PO TABS: 100.0000 mg | ORAL_TABLET | Freq: Every day | ORAL | 1 refills | Status: DC

## 2022-12-20 MED ORDER — CARVEDILOL 25 MG PO TABS
ORAL_TABLET | ORAL | 1 refills | Status: DC
Start: 2022-12-20 — End: 2023-08-17

## 2022-12-20 MED ORDER — EZETIMIBE 10 MG PO TABS
ORAL_TABLET | ORAL | 1 refills | Status: DC
Start: 2022-12-20 — End: 2023-02-16

## 2022-12-20 MED ORDER — IBANDRONATE SODIUM 150 MG PO TABS
150.0000 mg | ORAL_TABLET | ORAL | 1 refills | Status: DC
Start: 2022-12-20 — End: 2023-09-16

## 2022-12-20 NOTE — Assessment & Plan Note (Signed)
In remission Continue yearly mammograms 

## 2022-12-20 NOTE — Assessment & Plan Note (Signed)
C-Met and lipid profile today Encouraged her to consume a low-fat diet Continue rosuvastatin, ezetimibe and Vascepa

## 2022-12-20 NOTE — Assessment & Plan Note (Addendum)
A1c today Encourage low-carb diet and exercise for weight loss 

## 2022-12-20 NOTE — Assessment & Plan Note (Signed)
Continue valacyclovir as needed 

## 2022-12-20 NOTE — Assessment & Plan Note (Signed)
Continue ibandronate Encouraged daily weightbearing exercise

## 2022-12-20 NOTE — Progress Notes (Signed)
Subjective:    Patient ID: Lauren Cervantes, female    DOB: 31-Jul-1955, 67 y.o.   MRN: [id number]  HPI  History of breast cancer: In remission status post.  HTN: Her BP today is 130/84.  She is taking losartan and carvedilol as prescribed.  ECG from 06/2014 reviewed.  HLD: Her last LDL was 67, triglycerides 782, 01/2022.  She denies myalgias on rosuvastatin, ezetimibe and vascepa.  She tries to consume a low-fat diet.  Migraines: These occur rarely, about 1 x year.  Triggered by stress.  She takes tylenol as needed with some relief of symptoms.  She does not follow with neurology.  Prediabetes: Her last A1c was 6.2%, 01/2022.  She is not taking any oral diabetic medication at this time.  She does not check her sugars.  Osteoporosis: She is taking ibandronate as prescribed.  She tries to get weightbearing exercise in.  Bone density from 05/2018 reviewed.  Genital herpes: She denies recent flare.  She takes valacyclovir only as needed.  History of left breast cancer: Status postlumpectomy with subsequent lymphedema left arm.  She no longer follows with oncology.  Review of Systems     Past Medical History:  Diagnosis Date   Breast cancer (HCC) 2010   Hyperlipidemia    Hypertension    Lymphedema of arm    left arm.    Current Outpatient Medications  Medication Sig Dispense Refill   aspirin EC 81 MG tablet Take by mouth.     Calcium Carb-Cholecalciferol (CALCIUM 1000 + D) 1000-800 MG-UNIT TABS Take 1 tablet by mouth daily. Vitamin D 400mg  90 tablet 2   carvedilol (COREG) 25 MG tablet TAKE 1 TABLET(25 MG) BY MOUTH TWICE DAILY WITH A MEAL 180 tablet 1   cephALEXin (KEFLEX) 500 MG capsule Take 1 capsule (500 mg total) by mouth 3 (three) times daily. 15 capsule 0   cetirizine (ZYRTEC) 10 MG tablet Take 1 tablet (10 mg total) by mouth daily. 30 tablet 11   ezetimibe (ZETIA) 10 MG tablet TAKE 1 TABLET BY MOUTH EVERY DAY 90 tablet 0   ibandronate (BONIVA) 150 MG tablet TAKE 1 TABLET  (150 MG TOTAL) BY MOUTH EVERY 30 (THIRTY) DAYS. 3 tablet 0   losartan (COZAAR) 100 MG tablet TAKE 1 TABLET BY MOUTH EVERY DAY 90 tablet 0   Multiple Vitamins-Minerals (MULTIVITAMIN WITH MINERALS) tablet Take 1 tablet by mouth daily.     rosuvastatin (CRESTOR) 20 MG tablet TAKE 1 TABLET(20 MG) BY MOUTH AT BEDTIME 90 tablet 1   valACYclovir (VALTREX) 1000 MG tablet TAKE 2 TABLETS BY MOUTH 1 TIME. START OF A COLD SORE. TAKE 1 TABLET DAILY UNTIL FEVER BILSTER IS GONE OR FOR TOTAL OF 5 DAYS 90 tablet 0   VASCEPA 1 g capsule Take 2 capsules (2 g total) by mouth 2 (two) times daily. 360 capsule 1   No current facility-administered medications for this visit.    Allergies  Allergen Reactions   Codeine Rash   Hydrocodone-Acetaminophen Rash    Family History  Problem Relation Age of Onset   Hypertension Mother    Hypertension Father    Stroke Father    Cancer Other    Coronary artery disease Other    Stroke Other    Hyperlipidemia Other    Hypertension Other     Social History   Socioeconomic History   Marital status: Single    Spouse name: Not on file   Number of children: Not on file   Years  of education: Not on file   Highest education level: Not on file  Occupational History   Occupation: Print production planner and Ins Agent  Tobacco Use   Smoking status: Every Day    Current packs/day: 0.05    Average packs/day: 0.1 packs/day for 30.0 years (1.5 ttl pk-yrs)    Types: Cigarettes   Smokeless tobacco: Never   Tobacco comments:    smkes 1-3 cigs a day   Vaping Use   Vaping status: Former  Substance and Sexual Activity   Alcohol use: Yes    Alcohol/week: 2.0 standard drinks of alcohol    Types: 1 Glasses of wine, 1 Standard drinks or equivalent per week    Comment: 1 a day most days   Drug use: No   Sexual activity: Not on file  Other Topics Concern   Not on file  Social History Narrative   Regular exercise. Getting back into after cancer         Social Determinants of  Health   Financial Resource Strain: Not on file  Food Insecurity: Not on file  Transportation Needs: Not on file  Physical Activity: Not on file  Stress: Not on file  Social Connections: Not on file  Intimate Partner Violence: Not on file     Constitutional: Patient reports intermittent headaches.  Denies fever, malaise, fatigue, headache or abrupt weight changes.  HEENT: Denies eye pain, eye redness, ear pain, ringing in the ears, wax buildup, runny nose, nasal congestion, bloody nose, or sore throat. Respiratory: Denies difficulty breathing, shortness of breath, cough or sputum production.   Cardiovascular: Denies chest pain, chest tightness, palpitations or swelling in the hands or feet.  Gastrointestinal: Denies abdominal pain, bloating, constipation, diarrhea or blood in the stool.  GU: Denies urgency, frequency, pain with urination, burning sensation, blood in urine, odor or discharge. Musculoskeletal: Denies decrease in range of motion, difficulty with gait, muscle pain or joint pain and swelling.  Skin: Denies redness, rashes, lesions or ulcercations.  Neurological: Denies dizziness, difficulty with memory, difficulty with speech or problems with balance and coordination.  Psych: Denies anxiety, depression, SI/HI.  No other specific complaints in a complete review of systems (except as listed in HPI above).  Objective:   Physical Exam  BP 132/84 (BP Location: Right Arm, Patient Position: Sitting, Cuff Size: Normal)   Pulse 69   Temp (!) 95.5 F (35.3 C) (Temporal)   Wt 159 lb (72.1 kg)   SpO2 99%   BMI 25.66 kg/m   Wt Readings from Last 3 Encounters:  07/21/22 163 lb (73.9 kg)  02/18/22 163 lb (73.9 kg)  07/20/21 154 lb (69.9 kg)    General: Appears her stated age, overweight, in NAD. Skin: Warm, dry and intact.  HEENT: Head: normal shape and size; Eyes: sclera white, no icterus, conjunctiva pink, PERRLA and EOMs intact;  Cardiovascular: Normal rate and rhythm.  S1,S2 noted.  No murmur, rubs or gallops noted. No JVD or BLE edema. No carotid bruits noted. Pulmonary/Chest: Normal effort and positive vesicular breath sounds. No respiratory distress. No wheezes, rales or ronchi noted.  Abdomen: Soft and nontender. Normal bowel sounds. No distention or masses noted. Liver, spleen and kidneys non palpable. Musculoskeletal: Joint enlargement noted of left knee.  She has some difficulty getting up on the exam table.  No difficulty with gait.  Neurological: Alert and oriented. Cranial nerves II-XII grossly intact. Coordination normal.  Psychiatric: Mood and affect normal. Behavior is normal. Judgment and thought content normal.  BMET    Component Value Date/Time   NA 140 02/18/2022 0824   NA 143 05/12/2017 0835   K 4.5 02/18/2022 0824   CL 104 02/18/2022 0824   CO2 27 02/18/2022 0824   GLUCOSE 100 (H) 02/18/2022 0824   BUN 13 02/18/2022 0824   BUN 15 05/12/2017 0835   CREATININE 0.56 02/18/2022 0824   CALCIUM 9.6 02/18/2022 0824   GFRNONAA 104 07/23/2019 0847   GFRAA 120 07/23/2019 0847    Lipid Panel     Component Value Date/Time   CHOL 144 02/18/2022 0824   CHOL 125 05/12/2017 0835   TRIG 318 (H) 02/18/2022 0824   HDL 39 (L) 02/18/2022 0824   HDL 35 (L) 05/12/2017 0835   CHOLHDL 3.7 02/18/2022 0824   VLDL NOT CALC mg/dL 86/57/8469 6295   LDLCALC 67 02/18/2022 0824    CBC    Component Value Date/Time   WBC 8.4 07/20/2021 0935   RBC 4.16 07/20/2021 0935   HGB 13.9 07/20/2021 0935   HGB 13.6 06/24/2016 0851   HCT 41.2 07/20/2021 0935   HCT 40.8 06/24/2016 0851   PLT 186 07/20/2021 0935   PLT 185 06/24/2016 0851   MCV 99.0 07/20/2021 0935   MCV 95 06/24/2016 0851   MCH 33.4 (H) 07/20/2021 0935   MCHC 33.7 07/20/2021 0935   RDW 12.8 07/20/2021 0935   RDW 14.5 06/24/2016 0851   LYMPHSABS 2,940 07/20/2021 0935   LYMPHSABS 2.4 06/24/2016 0851   EOSABS 118 07/20/2021 0935   EOSABS 0.1 06/24/2016 0851   BASOSABS 50 07/20/2021 0935    BASOSABS 0.0 06/24/2016 0851    Hgb A1C Lab Results  Component Value Date   HGBA1C 6.2 (A) 02/18/2022            Assessment & Plan:    RTC in 6 months for your annual exam Nicki Reaper, NP

## 2022-12-20 NOTE — Assessment & Plan Note (Signed)
Encourage stress reduction techniques Continue Tylenol as needed 

## 2022-12-20 NOTE — Assessment & Plan Note (Signed)
Encourage diet and exercise for weight loss 

## 2022-12-20 NOTE — Patient Instructions (Signed)

## 2022-12-20 NOTE — Assessment & Plan Note (Signed)
Controlled on losartan and carvedilol Reinforced DASH diet and exercise for weight loss C-Met today

## 2022-12-21 LAB — COMPLETE METABOLIC PANEL WITH GFR
AG Ratio: 2.2 (calc) (ref 1.0–2.5)
ALT: 29 U/L (ref 6–29)
AST: 21 U/L (ref 10–35)
Albumin: 4.4 g/dL (ref 3.6–5.1)
Alkaline phosphatase (APISO): 53 U/L (ref 37–153)
BUN: 13 mg/dL (ref 7–25)
CO2: 25 mmol/L (ref 20–32)
Calcium: 9.7 mg/dL (ref 8.6–10.4)
Chloride: 107 mmol/L (ref 98–110)
Creat: 0.57 mg/dL (ref 0.50–1.05)
Globulin: 2 g/dL (ref 1.9–3.7)
Glucose, Bld: 104 mg/dL — ABNORMAL HIGH (ref 65–99)
Potassium: 4.4 mmol/L (ref 3.5–5.3)
Sodium: 141 mmol/L (ref 135–146)
Total Bilirubin: 0.5 mg/dL (ref 0.2–1.2)
Total Protein: 6.4 g/dL (ref 6.1–8.1)
eGFR: 101 mL/min/{1.73_m2} (ref >=60)

## 2022-12-21 LAB — LIPID PANEL
Cholesterol: 141 mg/dL (ref <200)
HDL: 40 mg/dL — ABNORMAL LOW (ref >=50)
LDL Cholesterol (Calc): 66 mg/dL
Non-HDL Cholesterol (Calc): 101 mg/dL (ref <130)
Total CHOL/HDL Ratio: 3.5 (calc) (ref <5.0)
Triglycerides: 265 mg/dL — ABNORMAL HIGH (ref <150)

## 2022-12-21 LAB — CBC
HCT: 40.9 % (ref 35.0–45.0)
Hemoglobin: 13.3 g/dL (ref 11.7–15.5)
MCH: 31.7 pg (ref 27.0–33.0)
MCHC: 32.5 g/dL (ref 32.0–36.0)
MCV: 97.4 fL (ref 80.0–100.0)
MPV: 10.9 fL (ref 7.5–12.5)
Platelets: 174 10*3/uL (ref 140–400)
RBC: 4.2 10*6/uL (ref 3.80–5.10)
RDW: 13.2 % (ref 11.0–15.0)
WBC: 7.1 10*3/uL (ref 3.8–10.8)

## 2022-12-21 LAB — HEMOGLOBIN A1C
Hgb A1c MFr Bld: 6.3 %{Hb} — ABNORMAL HIGH (ref <5.7)
Mean Plasma Glucose: 134 mg/dL
eAG (mmol/L): 7.4 mmol/L

## 2023-01-20 ENCOUNTER — Other Ambulatory Visit: Payer: Self-pay | Admitting: Internal Medicine

## 2023-01-20 DIAGNOSIS — Z0001 Encounter for general adult medical examination with abnormal findings: Secondary | ICD-10-CM

## 2023-01-21 NOTE — Telephone Encounter (Signed)
Requested Prescriptions  Pending Prescriptions Disp Refills   rosuvastatin (CRESTOR) 20 MG tablet [Pharmacy Med Name: ROSUVASTATIN CALCIUM 20 MG TAB] 90 tablet 3    Sig: TAKE 1 TABLET BY MOUTH EVERYDAY AT BEDTIME     Cardiovascular:  Antilipid - Statins 2 Failed - 01/20/2023  8:19 PM      Failed - Lipid Panel in normal range within the last 12 months    Cholesterol, Total  Date Value Ref Range Status  05/12/2017 125 100 - 199 mg/dL Final   Cholesterol  Date Value Ref Range Status  12/20/2022 141 <200 mg/dL Final   LDL Cholesterol (Calc)  Date Value Ref Range Status  12/20/2022 66 mg/dL (calc) Final    Comment:    Reference range: <100 . Desirable range <100 mg/dL for primary prevention;   <70 mg/dL for patients with CHD or diabetic patients  with > or = 2 CHD risk factors. Marland Kitchen LDL-C is now calculated using the Martin-Hopkins  calculation, which is a validated novel method providing  better accuracy than the Friedewald equation in the  estimation of LDL-C.  Horald Pollen et al. Lenox Ahr. 1610;960(45): 2061-2068  (http://education.[facility].com/faq/FAQ164)    HDL  Date Value Ref Range Status  12/20/2022 40 (L) > OR = 50 mg/dL Final  40/98/1191 35 (L) >39 mg/dL Final   Triglycerides  Date Value Ref Range Status  12/20/2022 265 (H) <150 mg/dL Final    Comment:    . If a non-fasting specimen was collected, consider repeat triglyceride testing on a fasting specimen if clinically indicated.  Perry Mount et al. J. of Clin. Lipidol. 2015;9:129-169. Marland Kitchen          Passed - Cr in normal range and within 360 days    Creat  Date Value Ref Range Status  12/20/2022 0.57 0.50 - 1.05 mg/dL Final   Creatinine, Urine  Date Value Ref Range Status  07/23/2019 78 20 - 275 mg/dL Final         Passed - Patient is not pregnant      Passed - Valid encounter within last 12 months    Recent Outpatient Visits           1 month ago Mixed hyperlipidemia   [facility] [place], [place] W, NP   6 months ago Laceration of right index finger without foreign body without damage to nail, initial encounter   [place] [facility] [place], Salvadore Oxford, NP   8 months ago COVID-19   [facility] [place], Salvadore Oxford, NP   11 months ago Encounter for general adult medical examination with abnormal findings   [place] [facility] [place], Salvadore Oxford, NP   1 year ago Hyperlipidemia, unspecified hyperlipidemia type   [place] [facility] [place], Oswaldo Conroy, PA-C       Future Appointments             In 4 months Baity, Salvadore Oxford, NP [facility], [facility]

## 2023-01-25 DIAGNOSIS — Z1211 Encounter for screening for malignant neoplasm of colon: Secondary | ICD-10-CM | POA: Diagnosis not present

## 2023-01-30 LAB — COLOGUARD: COLOGUARD: NEGATIVE

## 2023-02-15 ENCOUNTER — Other Ambulatory Visit: Payer: Self-pay | Admitting: Internal Medicine

## 2023-02-15 DIAGNOSIS — E782 Mixed hyperlipidemia: Secondary | ICD-10-CM

## 2023-02-16 NOTE — Telephone Encounter (Signed)
Requested Prescriptions  Pending Prescriptions Disp Refills   ezetimibe (ZETIA) 10 MG tablet [Pharmacy Med Name: EZETIMIBE 10MG  TABLETS] 90 tablet 1    Sig: TAKE 1 TABLET BY MOUTH EVERY DAY     Cardiovascular:  Antilipid - Sterol Transport Inhibitors Failed - 02/15/2023  8:09 AM      Failed - Lipid Panel in normal range within the last 12 months    Cholesterol, Total  Date Value Ref Range Status  05/12/2017 125 100 - 199 mg/dL Final   Cholesterol  Date Value Ref Range Status  12/20/2022 141 <200 mg/dL Final   LDL Cholesterol (Calc)  Date Value Ref Range Status  12/20/2022 66 mg/dL (calc) Final    Comment:    Reference range: <100 . Desirable range <100 mg/dL for primary prevention;   <70 mg/dL for patients with CHD or diabetic patients  with > or = 2 CHD risk factors. Marland Kitchen LDL-C is now calculated using the Martin-Hopkins  calculation, which is a validated novel method providing  better accuracy than the Friedewald equation in the  estimation of LDL-C.  Horald Pollen et al. Lenox Ahr. 1610;960(45): 2061-2068  (http://education.[facility].com/faq/FAQ164)    HDL  Date Value Ref Range Status  12/20/2022 40 (L) > OR = 50 mg/dL Final  40/98/1191 35 (L) >39 mg/dL Final   Triglycerides  Date Value Ref Range Status  12/20/2022 265 (H) <150 mg/dL Final    Comment:    . If a non-fasting specimen was collected, consider repeat triglyceride testing on a fasting specimen if clinically indicated.  Perry Mount et al. J. of Clin. Lipidol. 2015;9:129-169. Marland Kitchen          Passed - AST in normal range and within 360 days    AST  Date Value Ref Range Status  12/20/2022 21 10 - 35 U/L Final         Passed - ALT in normal range and within 360 days    ALT  Date Value Ref Range Status  12/20/2022 29 6 - 29 U/L Final         Passed - Patient is not pregnant      Passed - Valid encounter within last 12 months    Recent Outpatient Visits           1 month ago Mixed hyperlipidemia   [facility] [place], [place] W, NP   7 months ago Laceration of right index finger without foreign body without damage to nail, initial encounter   [facility] [place], Salvadore Oxford, NP   8 months ago COVID-19   [facility] [place], Salvadore Oxford, NP   12 months ago Encounter for general adult medical examination with abnormal findings   [place] [facility] [place], Salvadore Oxford, NP   1 year ago Hyperlipidemia, unspecified hyperlipidemia type   [place] [facility] Mecum, Oswaldo Conroy, PA-C       Future Appointments             In 3 months Baity, Salvadore Oxford, NP [place] [facility], [facility]

## 2023-04-10 ENCOUNTER — Other Ambulatory Visit: Payer: Self-pay | Admitting: Internal Medicine

## 2023-04-10 DIAGNOSIS — E782 Mixed hyperlipidemia: Secondary | ICD-10-CM

## 2023-04-11 NOTE — Telephone Encounter (Signed)
Requested medication (s) are due for refill today: No  Requested medication (s) are on the active medication list: yes    Last refill: 12/20/22  #90  1 refill  Future visit scheduled Yes 06/14/23  Notes to clinic: Off protocol. Cannot refuse non-delegated meds. Please review.  Requested Prescriptions  Pending Prescriptions Disp Refills   icosapent Ethyl (VASCEPA) 1 g capsule [Pharmacy Med Name: ICOSAPENT ETHYL 1 GRAM CAPSULE] 360 capsule 1    Sig: TAKE 2 CAPSULES BY MOUTH 2 TIMES DAILY.     Off-Protocol Failed - 04/11/2023  2:49 PM      Failed - Medication not assigned to a protocol, review manually.      Passed - Valid encounter within last 12 months    Recent Outpatient Visits           3 months ago Mixed hyperlipidemia   [place] [place], [place] W, NP   8 months ago Laceration of right index finger without foreign body without damage to nail, initial encounter   [facility] [place], Salvadore Oxford, NP   10 months ago COVID-19   [place] [place], Salvadore Oxford, NP   1 year ago Encounter for general adult medical examination with abnormal findings   [place] [place], Salvadore Oxford, NP   1 year ago Hyperlipidemia, unspecified hyperlipidemia type   [facility], Oswaldo Conroy, PA-C       Future Appointments             In 2 months Baity, Salvadore Oxford, NP [facility], [facility]

## 2023-04-15 ENCOUNTER — Telehealth: Payer: Self-pay

## 2023-04-15 NOTE — Progress Notes (Signed)
Lauren Cervantes (Key: [id number]) Need Help? Call us at [phone] Outcome Additional Information Required Message from [employer]: Drug is not covered by plan Drug Vascepa 1GM capsules ePA cloud logo Form [employer] Electronic PA Form [phone] NCPDP)   Drug is not covered by plan. Message sent to Nicki Reaper, NP

## 2023-04-15 NOTE — Telephone Encounter (Signed)
Left message for patient to return call concerning her medication.

## 2023-04-15 NOTE — Telephone Encounter (Signed)
-----   Message from [facility] sent at 04/15/2023  9:12 AM EST ----- Regarding: RE: Medication Recommend she take fish oil 1000 mg 3 times daily OTC then ----- Message ----- From: Jerene Bears, CMA Sent: 04/15/2023   8:43 AM EST To: Lorre Munroe, NP Subject: Medication                                     Vascepa 1 gm capsule is not covered by patients insurance.

## 2023-04-15 NOTE — Telephone Encounter (Signed)
Spoke with patient. This is all a confusion with [facility] and her medicine.   I did explain to patient to pick up fish oil 1000 mg to take TID if insurance denies medicine.   Verbal understanding

## 2023-04-19 ENCOUNTER — Other Ambulatory Visit: Payer: Self-pay | Admitting: Internal Medicine

## 2023-04-19 DIAGNOSIS — Z0001 Encounter for general adult medical examination with abnormal findings: Secondary | ICD-10-CM

## 2023-04-19 NOTE — Telephone Encounter (Signed)
Requested Prescriptions  Pending Prescriptions Disp Refills   valACYclovir (VALTREX) 1000 MG tablet [Pharmacy Med Name: VALACYCLOVIR HCL 1 GRAM TABLET] 90 tablet 0    Sig: TAKE 2 TABLETS BY MOUTH 1 TIME AT START OF A COLD SORE. TAKE 1 TABLET DAILY UNTIL FEVER BILSTER IS GONE OR FOR TOTAL OF 5 DAYS     Antimicrobials:  Antiviral Agents - Anti-Herpetic Passed - 04/19/2023  3:04 PM      Passed - Valid encounter within last 12 months    Recent Outpatient Visits           4 months ago Mixed hyperlipidemia   [place] [facility] [place], [place] W, NP   9 months ago Laceration of right index finger without foreign body without damage to nail, initial encounter   [place] [facility] [place], Salvadore Oxford, NP   10 months ago COVID-19   [facility] [place], Salvadore Oxford, NP   1 year ago Encounter for general adult medical examination with abnormal findings   [facility] [place], Salvadore Oxford, NP   1 year ago Hyperlipidemia, unspecified hyperlipidemia type    [facility], Oswaldo Conroy, PA-C       Future Appointments             In 1 month [place], Salvadore Oxford, NP  [facility], [facility]

## 2023-06-14 ENCOUNTER — Encounter: Payer: Self-pay | Admitting: Internal Medicine

## 2023-07-13 DIAGNOSIS — K080 Exfoliation of teeth due to systemic causes: Secondary | ICD-10-CM | POA: Diagnosis not present

## 2023-08-17 ENCOUNTER — Ambulatory Visit (INDEPENDENT_AMBULATORY_CARE_PROVIDER_SITE_OTHER): Payer: Self-pay | Admitting: Internal Medicine

## 2023-08-17 ENCOUNTER — Encounter: Payer: Self-pay | Admitting: Internal Medicine

## 2023-08-17 VITALS — BP 128/78 | Ht 66.0 in | Wt 158.4 lb

## 2023-08-17 DIAGNOSIS — Z6825 Body mass index (BMI) 25.0-25.9, adult: Secondary | ICD-10-CM

## 2023-08-17 DIAGNOSIS — E663 Overweight: Secondary | ICD-10-CM

## 2023-08-17 DIAGNOSIS — I10 Essential (primary) hypertension: Secondary | ICD-10-CM

## 2023-08-17 DIAGNOSIS — R7303 Prediabetes: Secondary | ICD-10-CM

## 2023-08-17 DIAGNOSIS — Z122 Encounter for screening for malignant neoplasm of respiratory organs: Secondary | ICD-10-CM | POA: Diagnosis not present

## 2023-08-17 DIAGNOSIS — E782 Mixed hyperlipidemia: Secondary | ICD-10-CM | POA: Diagnosis not present

## 2023-08-17 DIAGNOSIS — Z0001 Encounter for general adult medical examination with abnormal findings: Secondary | ICD-10-CM | POA: Diagnosis not present

## 2023-08-17 DIAGNOSIS — W6132XA Struck by chicken, initial encounter: Secondary | ICD-10-CM

## 2023-08-17 DIAGNOSIS — Z780 Asymptomatic menopausal state: Secondary | ICD-10-CM | POA: Diagnosis not present

## 2023-08-17 MED ORDER — LOSARTAN POTASSIUM 100 MG PO TABS: 100.0000 mg | ORAL_TABLET | Freq: Every day | ORAL | 1 refills | Status: DC

## 2023-08-17 MED ORDER — EZETIMIBE 10 MG PO TABS: ORAL_TABLET | ORAL | 1 refills | Status: DC

## 2023-08-17 MED ORDER — AMOXICILLIN-POT CLAVULANATE 875-125 MG PO TABS
1.0000 | ORAL_TABLET | Freq: Two times a day (BID) | ORAL | 0 refills | Status: DC
Start: 2023-08-17 — End: 2023-09-09

## 2023-08-17 MED ORDER — CARVEDILOL 25 MG PO TABS: ORAL_TABLET | ORAL | 1 refills | Status: DC

## 2023-08-17 NOTE — Assessment & Plan Note (Signed)
 Encourage diet and exercise for weight loss

## 2023-08-17 NOTE — Patient Instructions (Signed)
 Health Maintenance After Age 66 After age 4, you are at a higher risk for certain long-term diseases and infections as well as injuries from falls. Falls are a major cause of broken bones and head injuries in people who are older than age 47. Getting regular preventive care can help to keep you healthy and well. Preventive care includes getting regular testing and making lifestyle changes as recommended by your health care provider. Talk with your health care provider about: Which screenings and tests you should have. A screening is a test that checks for a disease when you have no symptoms. A diet and exercise plan that is right for you. What should I know about screenings and tests to prevent falls? Screening and testing are the best ways to find a health problem early. Early diagnosis and treatment give you the best chance of managing medical conditions that are common after age 37. Certain conditions and lifestyle choices may make you more likely to have a fall. Your health care provider may recommend: Regular vision checks. Poor vision and conditions such as cataracts can make you more likely to have a fall. If you wear glasses, make sure to get your prescription updated if your vision changes. Medicine review. Work with your health care provider to regularly review all of the medicines you are taking, including over-the-counter medicines. Ask your health care provider about any side effects that may make you more likely to have a fall. Tell your health care provider if any medicines that you take make you feel dizzy or sleepy. Strength and balance checks. Your health care provider may recommend certain tests to check your strength and balance while standing, walking, or changing positions. Foot health exam. Foot pain and numbness, as well as not wearing proper footwear, can make you more likely to have a fall. Screenings, including: Osteoporosis screening. Osteoporosis is a condition that causes  the bones to get weaker and break more easily. Blood pressure screening. Blood pressure changes and medicines to control blood pressure can make you feel dizzy. Depression screening. You may be more likely to have a fall if you have a fear of falling, feel depressed, or feel unable to do activities that you used to do. Alcohol use screening. Using too much alcohol can affect your balance and may make you more likely to have a fall. Follow these instructions at home: Lifestyle Do not drink alcohol if: Your health care provider tells you not to drink. If you drink alcohol: Limit how much you have to: 0-1 drink a day for women. 0-2 drinks a day for men. Know how much alcohol is in your drink. In the [place] one drink equals one 12 oz bottle of beer (355 mL), one 5 oz glass of wine (148 mL), or one 1 oz glass of hard liquor (44 mL). Do not use any products that contain nicotine or tobacco. These products include cigarettes, chewing tobacco, and vaping devices, such as e-cigarettes. If you need help quitting, ask your health care provider. Activity  Follow a regular exercise program to stay fit. This will help you maintain your balance. Ask your health care provider what types of exercise are appropriate for you. If you need a cane or walker, use it as recommended by your health care provider. Wear supportive shoes that have nonskid soles. Safety  Remove any tripping hazards, such as rugs, cords, and clutter. Install safety equipment such as grab bars in bathrooms and safety rails on stairs. Keep rooms and walkways  well-lit. General instructions Talk with your health care provider about your risks for falling. Tell your health care provider if: You fall. Be sure to tell your health care provider about all falls, even ones that seem minor. You feel dizzy, tiredness (fatigue), or off-balance. Take over-the-counter and prescription medicines only as told by your health care provider. These include  supplements. Eat a healthy diet and maintain a healthy weight. A healthy diet includes low-fat dairy products, low-fat (lean) meats, and fiber from whole grains, beans, and lots of fruits and vegetables. Stay current with your vaccines. Schedule regular health, dental, and eye exams. Summary Having a healthy lifestyle and getting preventive care can help to protect your health and wellness after age 11. Screening and testing are the best way to find a health problem early and help you avoid having a fall. Early diagnosis and treatment give you the best chance for managing medical conditions that are more common for people who are older than age 28. Falls are a major cause of broken bones and head injuries in people who are older than age 48. Take precautions to prevent a fall at home. Work with your health care provider to learn what changes you can make to improve your health and wellness and to prevent falls. This information is not intended to replace advice given to you by your health care provider. Make sure you discuss any questions you have with your health care provider. Document Revised: 09/01/2020 Document Reviewed: 09/01/2020 Elsevier Patient Education  2024 [employer].

## 2023-08-17 NOTE — Progress Notes (Signed)
 Subjective:    Patient ID: Lauren Cervantes, female    DOB: [birth year], 68 y.o.   MRN: [id number]  HPI  Patient presents to clinic today for her annual exam.  Flu: 12/2018 Tetanus: 06/2022 COVID: Moderna X2 Prevnar: never Pneumovax: never Shingrix : 05/2018, 09/2018 Pap smear: Hysterectomy Mammogram: 10/2022 Bone density: 05/2018 Colon screening: 01/2023, Cologuard Vision screening: annually Dentist: biannually  Diet: She does eat meat. She consumes fruits and veggies. She tries to avoid fried foods. She drinks mostly water. Exercise: Stationary bike  Review of Systems     Past Medical History:  Diagnosis Date   Breast cancer (HCC) 2010   Hyperlipidemia    Hypertension    Lymphedema of arm    left arm.    Current Outpatient Medications  Medication Sig Dispense Refill   aspirin EC 81 MG tablet Take by mouth.     Calcium  Carb-Cholecalciferol (CALCIUM  1000 + D) 1000-800 MG-UNIT TABS Take 1 tablet by mouth daily. Vitamin D 400mg  90 tablet 2   carvedilol  (COREG ) 25 MG tablet TAKE 1 TABLET(25 MG) BY MOUTH TWICE DAILY WITH A MEAL 180 tablet 1   cetirizine  (ZYRTEC ) 10 MG tablet Take 1 tablet (10 mg total) by mouth daily. (Patient not taking: Reported on 12/20/2022) 30 tablet 11   ezetimibe  (ZETIA ) 10 MG tablet TAKE 1 TABLET BY MOUTH EVERY DAY 90 tablet 1   ibandronate  (BONIVA ) 150 MG tablet Take 1 tablet (150 mg total) by mouth every 30 (thirty) days. 3 tablet 1   icosapent  Ethyl (VASCEPA ) 1 g capsule TAKE 2 CAPSULES BY MOUTH 2 TIMES DAILY. 360 capsule 0   losartan  (COZAAR ) 100 MG tablet Take 1 tablet (100 mg total) by mouth daily. 90 tablet 1   Multiple Vitamins-Minerals (MULTIVITAMIN WITH MINERALS) tablet Take 1 tablet by mouth daily.     rosuvastatin  (CRESTOR ) 20 MG tablet TAKE 1 TABLET BY MOUTH EVERYDAY AT BEDTIME 90 tablet 3   valACYclovir  (VALTREX ) 1000 MG tablet TAKE 2 TABLETS BY MOUTH 1 TIME AT START OF A COLD SORE. TAKE 1 TABLET DAILY UNTIL FEVER BILSTER IS GONE OR FOR  TOTAL OF 5 DAYS 90 tablet 0   No current facility-administered medications for this visit.    Allergies  Allergen Reactions   Codeine Rash   Hydrocodone-Acetaminophen  Rash    Family History  Problem Relation Age of Onset   Hypertension Mother    Hypertension Father    Stroke Father    Cancer Other    Coronary artery disease Other    Stroke Other    Hyperlipidemia Other    Hypertension Other     Social History   Socioeconomic History   Marital status: Single    Spouse name: Not on file   Number of children: Not on file   Years of education: Not on file   Highest education level: Not on file  Occupational History   Occupation: Print production planner and Ins Agent  Tobacco Use   Smoking status: Every Day    Current packs/day: 0.05    Average packs/day: 0.1 packs/day for 30.0 years (1.5 ttl pk-yrs)    Types: Cigarettes   Smokeless tobacco: Never   Tobacco comments:    smkes 1-3 cigs a day   Vaping Use   Vaping status: Former  Substance and Sexual Activity   Alcohol use: Yes    Alcohol/week: 2.0 standard drinks of alcohol    Types: 1 Glasses of wine, 1 Standard drinks or equivalent per week  Comment: 1 a day most days   Drug use: No   Sexual activity: Not on file  Other Topics Concern   Not on file  Social History Narrative   Regular exercise. Getting back into after cancer         Social Drivers of Health   Financial Resource Strain: Not on file  Food Insecurity: Not on file  Transportation Needs: Not on file  Physical Activity: Not on file  Stress: Not on file  Social Connections: Not on file  Intimate Partner Violence: Not on file     Constitutional:  Denies fever, malaise, fatigue, headaches, or abrupt weight changes.  HEENT: Denies eye pain, eye redness, ear pain, ringing in the ears, wax buildup, runny nose, nasal congestion, bloody nose, or sore throat. Respiratory: Denies difficulty breathing, shortness of breath, cough or sputum production.    Cardiovascular: Denies chest pain, chest tightness, palpitations or swelling in the hands or feet.  Gastrointestinal: Denies abdominal pain, bloating, constipation, diarrhea or blood in the stool.  GU: Denies urgency, frequency, pain with urination, burning sensation, blood in urine, odor or discharge. Musculoskeletal: Denies decrease in range of motion, difficulty with gait, muscle pain or joint pain or swelling.  Skin: Patient reports puncture wound to right lower extremity.  Denies rashes, lesions or ulcercations.  Neurological: Denies dizziness, difficulty with memory, difficulty with speech or problems with balance and coordination.  Psych:  Denies anxiety, depression, SI/HI.  No other specific complaints in a complete review of systems (except as listed in HPI above).  Objective:   Physical Exam   BP 128/78 (BP Location: Right Arm, Patient Position: Sitting, Cuff Size: Normal)   Ht 5\' 6"  (1.676 m)   Wt 158 lb 6.4 oz (71.8 kg)   BMI 25.57 kg/m    Wt Readings from Last 3 Encounters:  12/20/22 159 lb (72.1 kg)  07/21/22 163 lb (73.9 kg)  02/18/22 163 lb (73.9 kg)    General: Appears her stated age, overweight, in NAD. Skin: Warm, dry and intact.  2 puncture wounds noted to the anterior and lateral aspect of the right lower leg. HEENT: Head: normal shape and size; Eyes: sclera white, no icterus, conjunctiva pink, PERRLA and EOMs intact;  Neck:  Neck supple, trachea midline. No masses, lumps or thyromegaly present.  Cardiovascular: Normal rate and rhythm. S1,S2 noted.  Murmur noted. No JVD or BLE edema. No carotid bruits noted. Pulmonary/Chest: Normal effort and positive vesicular breath sounds. No respiratory distress. No wheezes, rales or ronchi noted.  Abdomen: Normal bowel sounds. Musculoskeletal: Strength 5/5 BUE/BLE.  No difficulty with gait.  Neurological: Alert and oriented. Cranial nerves II-XII grossly intact. Coordination normal.  Psychiatric: Mood and affect normal.  Behavior is normal. Judgment and thought content normal.    BMET    Component Value Date/Time   NA 141 12/20/2022 0923   NA 143 05/12/2017 0835   K 4.4 12/20/2022 0923   CL 107 12/20/2022 0923   CO2 25 12/20/2022 0923   GLUCOSE 104 (H) 12/20/2022 0923   BUN 13 12/20/2022 0923   BUN 15 05/12/2017 0835   CREATININE 0.57 12/20/2022 0923   CALCIUM  9.7 12/20/2022 0923   GFRNONAA 104 07/23/2019 0847   GFRAA 120 07/23/2019 0847    Lipid Panel     Component Value Date/Time   CHOL 141 12/20/2022 0923   CHOL 125 05/12/2017 0835   TRIG 265 (H) 12/20/2022 0923   HDL 40 (L) 12/20/2022 0923   HDL 35 (L) 05/12/2017  0835   CHOLHDL 3.5 12/20/2022 0923   VLDL NOT CALC mg/dL 40/98/1191 4782   LDLCALC 66 12/20/2022 0923    CBC    Component Value Date/Time   WBC 7.1 12/20/2022 0923   RBC 4.20 12/20/2022 0923   HGB 13.3 12/20/2022 0923   HGB 13.6 06/24/2016 0851   HCT 40.9 12/20/2022 0923   HCT 40.8 06/24/2016 0851   PLT 174 12/20/2022 0923   PLT 185 06/24/2016 0851   MCV 97.4 12/20/2022 0923   MCV 95 06/24/2016 0851   MCH 31.7 12/20/2022 0923   MCHC 32.5 12/20/2022 0923   RDW 13.2 12/20/2022 0923   RDW 14.5 06/24/2016 0851   LYMPHSABS 2,940 07/20/2021 0935   LYMPHSABS 2.4 06/24/2016 0851   EOSABS 118 07/20/2021 0935   EOSABS 0.1 06/24/2016 0851   BASOSABS 50 07/20/2021 0935   BASOSABS 0.0 06/24/2016 0851    Hgb A1C Lab Results  Component Value Date   HGBA1C 6.3 (H) 12/20/2022           Assessment & Plan:   Preventative Health Maintenance:  She declines flu shot today Tetanus UTD Encouraged her to get her COVID booster She declines Prevnar Shingrix  UTD She no longer needs Pap smears Mammogram ordered-she will call to schedule Bone density ordered-she will call to schedule Colon screening UTD CT lung cancer screening ordered Encouraged her to consume a balanced diet and exercise regimen Advised her to see an eye doctor and dentist annually We will check  CBC, c-Met, lipid, A1c today  Struck by a chicken:  Rx for Augmentin  875-125 mg twice daily x 10 days  RTC in 6 months, follow-up chronic conditions Helayne Lo, NP

## 2023-08-18 ENCOUNTER — Encounter: Payer: Self-pay | Admitting: Internal Medicine

## 2023-08-18 LAB — HEMOGLOBIN A1C
Hgb A1c MFr Bld: 6.3 % — ABNORMAL HIGH (ref <5.7)
Mean Plasma Glucose: 134 mg/dL
eAG (mmol/L): 7.4 mmol/L

## 2023-08-18 LAB — COMPREHENSIVE METABOLIC PANEL WITH GFR
AG Ratio: 2.5 (calc) (ref 1.0–2.5)
ALT: 27 U/L (ref 6–29)
AST: 21 U/L (ref 10–35)
Albumin: 4.7 g/dL (ref 3.6–5.1)
Alkaline phosphatase (APISO): 52 U/L (ref 37–153)
BUN: 14 mg/dL (ref 7–25)
CO2: 26 mmol/L (ref 20–32)
Calcium: 9.8 mg/dL (ref 8.6–10.4)
Chloride: 105 mmol/L (ref 98–110)
Creat: 0.59 mg/dL (ref 0.50–1.05)
Globulin: 1.9 g/dL (ref 1.9–3.7)
Glucose, Bld: 104 mg/dL — ABNORMAL HIGH (ref 65–99)
Potassium: 4.2 mmol/L (ref 3.5–5.3)
Sodium: 142 mmol/L (ref 135–146)
Total Bilirubin: 0.6 mg/dL (ref 0.2–1.2)
Total Protein: 6.6 g/dL (ref 6.1–8.1)
eGFR: 100 mL/min/{1.73_m2} (ref >=60)

## 2023-08-18 LAB — CBC
HCT: 40.8 % (ref 35.0–45.0)
Hemoglobin: 13.6 g/dL (ref 11.7–15.5)
MCH: 32.3 pg (ref 27.0–33.0)
MCHC: 33.3 g/dL (ref 32.0–36.0)
MCV: 96.9 fL (ref 80.0–100.0)
MPV: 10.9 fL (ref 7.5–12.5)
Platelets: 178 10*3/uL (ref 140–400)
RBC: 4.21 10*6/uL (ref 3.80–5.10)
RDW: 13.2 % (ref 11.0–15.0)
WBC: 7.7 10*3/uL (ref 3.8–10.8)

## 2023-08-18 LAB — LIPID PANEL
Cholesterol: 104 mg/dL (ref <200)
HDL: 37 mg/dL — ABNORMAL LOW (ref >=50)
LDL Cholesterol (Calc): 37 mg/dL
Non-HDL Cholesterol (Calc): 67 mg/dL (ref <130)
Total CHOL/HDL Ratio: 2.8 (calc) (ref <5.0)
Triglycerides: 241 mg/dL — ABNORMAL HIGH (ref <150)

## 2023-08-30 DIAGNOSIS — K080 Exfoliation of teeth due to systemic causes: Secondary | ICD-10-CM | POA: Diagnosis not present

## 2023-09-08 ENCOUNTER — Telehealth: Payer: Self-pay

## 2023-09-08 NOTE — Telephone Encounter (Signed)
 Copied from CRM [id number]. Topic: Clinical - Medical Advice >> Sep 08, 2023  8:26 AM Georgeann Kindred wrote: Reason for CRM: Patient called to inform NP Helayne Lo of a rash on her face, chest, back of the neck, and ears with burn feeling from the one on the chest. Patient would like advice on what to do. Please contact patient at [phone].

## 2023-09-09 ENCOUNTER — Ambulatory Visit (INDEPENDENT_AMBULATORY_CARE_PROVIDER_SITE_OTHER): Admitting: Family Medicine

## 2023-09-09 ENCOUNTER — Encounter: Payer: Self-pay | Admitting: Family Medicine

## 2023-09-09 VITALS — BP 130/72 | HR 70 | Ht 66.0 in | Wt 158.0 lb

## 2023-09-09 DIAGNOSIS — R21 Rash and other nonspecific skin eruption: Secondary | ICD-10-CM | POA: Diagnosis not present

## 2023-09-09 MED ORDER — PREDNISONE 10 MG PO TABS: ORAL_TABLET | ORAL | 0 refills | Status: DC

## 2023-09-09 MED ORDER — MUPIROCIN 2 % EX OINT
1.0000 | TOPICAL_OINTMENT | Freq: Two times a day (BID) | CUTANEOUS | 0 refills | Status: DC
Start: 2023-09-09 — End: 2024-02-14

## 2023-09-09 NOTE — Patient Instructions (Addendum)
 Thank you for coming to the office today.  Rash is of uncertain origin  We will treat with prednisone  steroid Add topical Mupirocin antibiotic as well  Tips to reduce Eczema Flares: For baths/showers, limit bathing to every other day if you can (max 1 x daily)  Use a gentle, unscented soap and lukewarm water (hot water is most irritating to skin) Never scrub skin with too much pressure, this causes more irritation. Pat skin dry, then leave it slightly damp. DO NOT scrub it dry. Apply steroid cream to skin and rub in all the way, wait 15 min, then apply a daily moisturizer (Vaseline, Eucerin, Aveeno). Continue daily moisturizer every day of the year (even after flare is resolved) - If you have eczema on hands or dry hands, recommend wearing any type of gloves overnight (cloth, fabric, or even nitrile/latex) to improve effect of topical moisturizer  If develops redness, honey colored crust oozing, drainage of pus, bleeding, or redness / swelling, pain, please return for re-evaluation, may have become infected after scratching.   Please schedule a Follow-up Appointment to: Return if symptoms worsen or fail to improve.  If you have any other questions or concerns, please feel free to call the office or send a message through MyChart. You may also schedule an earlier appointment if necessary.  Additionally, you may be receiving a survey about your experience at our office within a few days to 1 week by e-mail or mail. We value your feedback.  Domingo Friend, DO [facility], [place]

## 2023-09-09 NOTE — Progress Notes (Signed)
 Subjective:    Patient ID: Lauren Cervantes, female    DOB: 06/17/1955, 68 y.o.   MRN: [id number]  Lauren Cervantes is a 68 y.o. female presenting on 09/09/2023 for Rash (On face, chest, noticed after taking recent Abx Augmentin  )  PCP Helayne Lo, FNP   HPI  Discussed the use of AI scribe software for clinical note transcription with the patient, who gave verbal consent to proceed.  History of Present Illness   Lauren Cervantes is a 68 year old female who presents with a rash on her face, chest, and neck.  The rash began approximately three weeks ago, coinciding with the start of an antibiotic Augmentin  course. It is described as burning rather than itchy and is located on her face, chest, ears, and back of the neck, sparing her belly and back. The rash initially appeared on her central chest area then spread to her nose and has since spread to her chin and ears, with her ears being particularly sensitive. She initially thought it might be fever blisters, which she has experienced before, but this was not the case.  She has no history of allergic reactions to antibiotics or other medications. She mentions that after receiving COVID vaccinations, she has heard that bodily reactions can change, though she is unsure if this is related. She denies any new exposures to soaps, detergents, or dietary changes that could account for the rash. No fevers, chills, or systemic symptoms accompany the rash.  Current medications include Benadryl, which has not alleviated her symptoms and instead causes a sensation of 'something crawling under my skin'. She has a history of developing small bumps at her hairline during the summer, which she attributes to heat rash, but notes that the current rash is different in nature.  No new drug reactions or allergies, and no fever, chills, nausea, or vomiting. She has not experienced any spreading redness or significant pain associated with the rash.          09/09/2023    7:57 AM 08/17/2023    9:42 AM 12/20/2022    9:27 AM  Depression screen PHQ 2/9  Decreased Interest 1 1 1   Down, Depressed, Hopeless 0 0 0  PHQ - 2 Score 1 1 1   Altered sleeping 2 2   Tired, decreased energy 1 1   Change in appetite 0 0   Feeling bad or failure about yourself  0 0   Trouble concentrating 0 0   Moving slowly or fidgety/restless 0 0   Suicidal thoughts 0 0   PHQ-9 Score 4 4   Difficult doing work/chores Not difficult at all Not difficult at all        09/09/2023    7:58 AM 08/17/2023    9:42 AM 12/20/2022    9:27 AM 07/21/2022   10:34 AM  GAD 7 : Generalized Anxiety Score  Nervous, Anxious, on Edge 0 0 0 0  Control/stop worrying 0 0 0 0  Worry too much - different things 0 0 0 0  Trouble relaxing 0 0 1 0  Restless 0 0 0 0  Easily annoyed or irritable 0 0 0 0  Afraid - awful might happen 0 0 0 0  Total GAD 7 Score 0 0 1 0  Anxiety Difficulty Not difficult at all Not difficult at all Not difficult at all Not difficult at all    Social History   Tobacco Use   Smoking status: Every Day    Current  packs/day: 0.05    Average packs/day: 0.1 packs/day for 30.0 years (1.5 ttl pk-yrs)    Types: Cigarettes   Smokeless tobacco: Never   Tobacco comments:    smkes 1-3 cigs a day   Vaping Use   Vaping status: Former  Substance Use Topics   Alcohol use: Yes    Alcohol/week: 2.0 standard drinks of alcohol    Types: 1 Glasses of wine, 1 Standard drinks or equivalent per week    Comment: 1 a day most days   Drug use: No    Review of Systems Per HPI unless specifically indicated above     Objective:     BP 130/72 (BP Location: Right Arm, Patient Position: Sitting, Cuff Size: Normal)   Pulse 70   Ht 5\' 6"  (1.676 m)   Wt 158 lb (71.7 kg)   SpO2 97%   BMI 25.50 kg/m   Wt Readings from Last 3 Encounters:  09/09/23 158 lb (71.7 kg)  08/17/23 158 lb 6.4 oz (71.8 kg)  12/20/22 159 lb (72.1 kg)    Physical Exam Vitals and nursing note reviewed.   Constitutional:      General: She is not in acute distress.    Appearance: Normal appearance. She is well-developed. She is not diaphoretic.     Comments: Well-appearing, comfortable, cooperative  HENT:     Head: Normocephalic and atraumatic.  Eyes:     General:        Right eye: No discharge.        Left eye: No discharge.     Conjunctiva/sclera: Conjunctivae normal.  Cardiovascular:     Rate and Rhythm: Normal rate.  Pulmonary:     Effort: Pulmonary effort is normal.  Skin:    General: Skin is warm and dry.     Findings: Rash (see photos for rash. Maculopapular red raised, slightly indurated, no pustules or draining) present. No erythema.  Neurological:     Mental Status: She is alert and oriented to person, place, and time.  Psychiatric:        Mood and Affect: Mood normal.        Behavior: Behavior normal.        Thought Content: Thought content normal.     Comments: Well groomed, good eye contact, normal speech and thoughts     Upper Chest Wall   Face     Results for orders placed or performed in visit on 08/17/23  HM MAMMOGRAPHY   Collection Time: 11/02/22 12:00 AM  Result Value Ref Range   HM Mammogram 0-4 Bi-Rad 0-4 Bi-Rad, Self Reported Normal  CBC   Collection Time: 08/17/23  9:52 AM  Result Value Ref Range   WBC 7.7 3.8 - 10.8 Thousand/uL   RBC 4.21 3.80 - 5.10 Million/uL   Hemoglobin 13.6 11.7 - 15.5 g/dL   HCT 08.6 57.8 - 46.9 %   MCV 96.9 80.0 - 100.0 fL   MCH 32.3 27.0 - 33.0 pg   MCHC 33.3 32.0 - 36.0 g/dL   RDW 62.9 52.8 - 41.3 %   Platelets 178 140 - 400 Thousand/uL   MPV 10.9 7.5 - 12.5 fL  Comprehensive metabolic panel with GFR   Collection Time: 08/17/23  9:52 AM  Result Value Ref Range   Glucose, Bld 104 (H) 65 - 99 mg/dL   BUN 14 7 - 25 mg/dL   Creat 2.44 0.10 - 2.72 mg/dL   eGFR 536 > OR = 60 UY/QIH/4.74Q5   BUN/Creatinine Ratio SEE  NOTE: 6 - 22 (calc)   Sodium 142 135 - 146 mmol/L   Potassium 4.2 3.5 - 5.3 mmol/L   Chloride  105 98 - 110 mmol/L   CO2 26 20 - 32 mmol/L   Calcium  9.8 8.6 - 10.4 mg/dL   Total Protein 6.6 6.1 - 8.1 g/dL   Albumin 4.7 3.6 - 5.1 g/dL   Globulin 1.9 1.9 - 3.7 g/dL (calc)   AG Ratio 2.5 1.0 - 2.5 (calc)   Total Bilirubin 0.6 0.2 - 1.2 mg/dL   Alkaline phosphatase (APISO) 52 37 - 153 U/L   AST 21 10 - 35 U/L   ALT 27 6 - 29 U/L  Lipid panel   Collection Time: 08/17/23  9:52 AM  Result Value Ref Range   Cholesterol 104 <200 mg/dL   HDL 37 (L) > OR = 50 mg/dL   Triglycerides 161 (H) <150 mg/dL   LDL Cholesterol (Calc) 37 mg/dL (calc)   Total CHOL/HDL Ratio 2.8 <5.0 (calc)   Non-HDL Cholesterol (Calc) 67 <096 mg/dL (calc)  Hemoglobin E4V   Collection Time: 08/17/23  9:52 AM  Result Value Ref Range   Hgb A1c MFr Bld 6.3 (H) <5.7 %   Mean Plasma Glucose 134 mg/dL   eAG (mmol/L) 7.4 mmol/L      Assessment & Plan:   Problem List Items Addressed This Visit   None Visit Diagnoses       Rash and nonspecific skin eruption    -  Primary   Relevant Medications   predniSONE  (DELTASONE ) 10 MG tablet   mupirocin ointment (BACTROBAN) 2 %        Rash / Contact dermatitis Patchy Rash for three weeks on face, chest, ears, and back of neck. Burning sensation, sensitivity in ears. Differential includes shingles, allergic reaction, bacterial, and fungal infection. Shingles unlikely due to distribution on both sides of body and persistent rash.  Allergic reaction to antibiotics possible but unlikely at this duration. Bacterial infection unlikely due to lack of spreading erythema. Fungal infection not strongly suspected.  Contact dermatitis likely due to topical exposure. She has atopic history before  - Prescribed prednisone  with six-day taper (6, 5, 4, 3, 2, 1). - Prescribed prescription-strength topical antibiotic Mupirocin ointment for spot treatment. - Advised against oral antibiotics now since question about Augmentin  - Instructed to avoid NSAIDs; acetaminophen  is safe. - Advised  to avoid hot water, scrubbing, abrasive soaps; use cooler water, blot dry, gentle soaps. - Monitor for fevers, chills, spreading erythema, nausea, vomiting, increased pain. - Consider dermatology referral if rash persists or worsens.        No orders of the defined types were placed in this encounter.   Meds ordered this encounter  Medications   predniSONE  (DELTASONE ) 10 MG tablet    Sig: Take 6 tabs with breakfast Day 1, 5 tabs Day 2, 4 tabs Day 3, 3 tabs Day 4, 2 tabs Day 5, 1 tab Day 6.    Dispense:  21 tablet    Refill:  0   mupirocin ointment (BACTROBAN) 2 %    Sig: Apply 1 Application topically 2 (two) times daily. For up to 7-10 days    Dispense:  22 g    Refill:  0    Follow up plan: Return if symptoms worsen or fail to improve.  Domingo Friend, DO [facility] 09/09/2023, 8:10 AM

## 2023-09-14 ENCOUNTER — Other Ambulatory Visit: Payer: Self-pay | Admitting: Internal Medicine

## 2023-09-14 DIAGNOSIS — M810 Age-related osteoporosis without current pathological fracture: Secondary | ICD-10-CM

## 2023-09-16 NOTE — Telephone Encounter (Signed)
 Requested medication (s) are due for refill today: yes  Requested medication (s) are on the active medication list: yes  Last refill:  12/20/22 3 tab 1 RF  Future visit scheduled: yes  Notes to clinic:  missing lab work   Requested Prescriptions  Pending Prescriptions Disp Refills   ibandronate  (BONIVA ) 150 MG tablet [Pharmacy Med Name: IBANDRONATE  SODIUM 150MG  TABLETS] 3 tablet 1    Sig: TAKE 1 TABLET BY MOUTH EVERY 30 DAYS     Endocrinology:  Bisphosphonates Failed - 09/16/2023  8:36 AM      Failed - Vitamin D in normal range and within 360 days    No results found for: "ZO1096EA5", "WU9811BJ4", "VD125OH2TOT", "25OHVITD3", "25OHVITD2", "25OHVITD1", "VD25OH"       Failed - Mg Level in normal range and within 360 days    No results found for: "MG"       Failed - Phosphate in normal range and within 360 days    No results found for: "PHOS"       Failed - Valid encounter within last 12 months    Recent Outpatient Visits           1 week ago Rash and nonspecific skin eruption   [facility] Raina Bunting, DO   1 month ago Postmenopausal estrogen deficiency   [facility] [place], Rankin Buzzard, NP              Failed - Bone Mineral Density or Dexa Scan completed in the last 2 years      Passed - Ca in normal range and within 360 days    Calcium   Date Value Ref Range Status  08/17/2023 9.8 8.6 - 10.4 mg/dL Final         Passed - Cr in normal range and within 360 days    Creat  Date Value Ref Range Status  08/17/2023 0.59 0.50 - 1.05 mg/dL Final   Creatinine, Urine  Date Value Ref Range Status  07/23/2019 78 20 - 275 mg/dL Final         Passed - eGFR is 30 or above and within 360 days    GFR, Est African American  Date Value Ref Range Status  07/23/2019 120 > OR = 60 mL/min/1.64m2 Final   GFR, Est Non African American  Date Value Ref Range Status  07/23/2019 104 > OR = 60 mL/min/1.44m2 Final   eGFR   Date Value Ref Range Status  08/17/2023 100 > OR = 60 mL/min/1.34m2 Final

## 2023-10-05 DIAGNOSIS — L710 Perioral dermatitis: Secondary | ICD-10-CM | POA: Diagnosis not present

## 2023-11-28 DIAGNOSIS — L2989 Other pruritus: Secondary | ICD-10-CM | POA: Diagnosis not present

## 2023-11-28 DIAGNOSIS — D224 Melanocytic nevi of scalp and neck: Secondary | ICD-10-CM | POA: Diagnosis not present

## 2023-11-28 DIAGNOSIS — L309 Dermatitis, unspecified: Secondary | ICD-10-CM | POA: Diagnosis not present

## 2023-11-28 DIAGNOSIS — L538 Other specified erythematous conditions: Secondary | ICD-10-CM | POA: Diagnosis not present

## 2023-11-28 DIAGNOSIS — L821 Other seborrheic keratosis: Secondary | ICD-10-CM | POA: Diagnosis not present

## 2023-11-28 DIAGNOSIS — L814 Other melanin hyperpigmentation: Secondary | ICD-10-CM | POA: Diagnosis not present

## 2023-11-28 DIAGNOSIS — L820 Inflamed seborrheic keratosis: Secondary | ICD-10-CM | POA: Diagnosis not present

## 2023-12-09 DIAGNOSIS — R92323 Mammographic fibroglandular density, bilateral breasts: Secondary | ICD-10-CM | POA: Diagnosis not present

## 2023-12-09 DIAGNOSIS — Z1231 Encounter for screening mammogram for malignant neoplasm of breast: Secondary | ICD-10-CM | POA: Diagnosis not present

## 2024-02-10 ENCOUNTER — Ambulatory Visit (INDEPENDENT_AMBULATORY_CARE_PROVIDER_SITE_OTHER)

## 2024-02-10 VITALS — BP 130/78 | Ht 66.0 in | Wt 154.0 lb

## 2024-02-10 DIAGNOSIS — Z0000 Encounter for general adult medical examination without abnormal findings: Secondary | ICD-10-CM

## 2024-02-10 NOTE — Progress Notes (Signed)
 Because this visit was a virtual/telehealth visit,  certain criteria was not obtained, such a blood pressure, CBG if applicable, and timed get up and go. Any medications not marked as taking were not mentioned during the medication reconciliation part of the visit. Any vitals not documented were not able to be obtained due to this being a telehealth visit or patient was unable to self-report a recent blood pressure reading due to a lack of equipment at home via telehealth. Vitals that have been documented are verbally provided by the patient.   This visit was performed by a medical professional under my direct supervision. I was immediately available for consultation/collaboration. I have reviewed and agree with the Annual Wellness Visit documentation.  Subjective:   Lauren Cervantes is a 68 y.o. who presents for a Medicare Wellness preventive visit.  As a reminder, Annual Wellness Visits don't include a physical exam, and some assessments may be limited, especially if this visit is performed virtually. We may recommend an in-person follow-up visit with your provider if needed.  Visit Complete: Virtual I connected with  Lauren Cervantes on 02/10/24 by a audio enabled telemedicine application and verified that I am speaking with the correct person using two identifiers.  Patient Location: Home  Provider Location: Home Office  I discussed the limitations of evaluation and management by telemedicine. The patient expressed understanding and agreed to proceed.  Vital Signs: Because this visit was a virtual/telehealth visit, some criteria may be missing or patient reported. Any vitals not documented were not able to be obtained and vitals that have been documented are patient reported.  VideoDeclined- This patient declined Librarian, academic. Therefore the visit was completed with audio only.  Persons Participating in Visit: Patient.  AWV Questionnaire: No: Patient  Medicare AWV questionnaire was not completed prior to this visit.  Cardiac Risk Factors include: advanced age (>32men, >63 women);hypertension;dyslipidemia;smoking/ tobacco exposure     Objective:    Today's Vitals   02/10/24 0840  BP: 130/78  Weight: 154 lb (69.9 kg)  Height: 5' 6 (1.676 m)  PainSc: 5    Body mass index is 24.86 kg/m.     02/10/2024    8:39 AM 08/07/2020    1:45 PM 08/07/2020    1:30 PM  Advanced Directives  Does Patient Have a Medical Advance Directive? No  No  Would patient like information on creating a medical advance directive? No - Patient declined No - Patient declined     Current Medications (verified) Outpatient Encounter Medications as of 02/10/2024  Medication Sig   aspirin EC 81 MG tablet Take by mouth.   Calcium  Carb-Cholecalciferol (CALCIUM  1000 + D) 1000-800 MG-UNIT TABS Take 1 tablet by mouth daily. Vitamin D 400mg    carvedilol  (COREG ) 25 MG tablet TAKE 1 TABLET(25 MG) BY MOUTH TWICE DAILY WITH A MEAL   ezetimibe  (ZETIA ) 10 MG tablet TAKE 1 TABLET BY MOUTH EVERY DAY   ibandronate  (BONIVA ) 150 MG tablet TAKE 1 TABLET BY MOUTH EVERY 30 DAYS   icosapent  Ethyl (VASCEPA ) 1 g capsule TAKE 2 CAPSULES BY MOUTH 2 TIMES DAILY.   losartan  (COZAAR ) 100 MG tablet Take 1 tablet (100 mg total) by mouth daily.   Multiple Vitamins-Minerals (MULTIVITAMIN WITH MINERALS) tablet Take 1 tablet by mouth daily.   mupirocin  ointment (BACTROBAN ) 2 % Apply 1 Application topically 2 (two) times daily. For up to 7-10 days   predniSONE  (DELTASONE ) 10 MG tablet Take 6 tabs with breakfast Day 1, 5 tabs Day  2, 4 tabs Day 3, 3 tabs Day 4, 2 tabs Day 5, 1 tab Day 6.   rosuvastatin  (CRESTOR ) 20 MG tablet TAKE 1 TABLET BY MOUTH EVERYDAY AT BEDTIME   valACYclovir  (VALTREX ) 1000 MG tablet TAKE 2 TABLETS BY MOUTH 1 TIME AT START OF A COLD SORE. TAKE 1 TABLET DAILY UNTIL FEVER BILSTER IS GONE OR FOR TOTAL OF 5 DAYS   No facility-administered encounter medications on file as of  02/10/2024.    Allergies (verified) Codeine and Hydrocodone-acetaminophen    History: Past Medical History:  Diagnosis Date   Breast cancer (HCC) 2010   Hyperlipidemia    Hypertension    Lymphedema of arm    left arm.   Past Surgical History:  Procedure Laterality Date   BREAST LUMPECTOMY     TOTAL ABDOMINAL HYSTERECTOMY     Family History  Problem Relation Age of Onset   Hypertension Mother    Hypertension Father    Stroke Father    Cancer Other    Coronary artery disease Other    Stroke Other    Hyperlipidemia Other    Hypertension Other    Social History   Socioeconomic History   Marital status: Single    Spouse name: Not on file   Number of children: Not on file   Years of education: Not on file   Highest education level: Not on file  Occupational History   Occupation: Print production planner and Ins Agent  Tobacco Use   Smoking status: Every Day    Current packs/day: 0.05    Average packs/day: 0.1 packs/day for 30.0 years (1.5 ttl pk-yrs)    Types: Cigarettes   Smokeless tobacco: Never   Tobacco comments:    smkes 1-3 cigs a day   Vaping Use   Vaping status: Former  Substance and Sexual Activity   Alcohol use: Yes    Alcohol/week: 2.0 standard drinks of alcohol    Types: 1 Glasses of wine, 1 Standard drinks or equivalent per week    Comment: 1 a day most days   Drug use: No   Sexual activity: Not on file  Other Topics Concern   Not on file  Social History Narrative   Regular exercise. Getting back into after cancer         Social Drivers of Health   Financial Resource Strain: Low Risk  (02/10/2024)   Overall Financial Resource Strain (CARDIA)    Difficulty of Paying Living Expenses: Not hard at all  Food Insecurity: No Food Insecurity (02/10/2024)   Hunger Vital Sign    Worried About Running Out of Food in the Last Year: Never true    Ran Out of Food in the Last Year: Never true  Transportation Needs: No Transportation Needs (02/10/2024)   PRAPARE  - Administrator, Civil Service (Medical): No    Lack of Transportation (Non-Medical): No  Physical Activity: Insufficiently Active (02/10/2024)   Exercise Vital Sign    Days of Exercise per Week: 7 days    Minutes of Exercise per Session: 20 min  Stress: No Stress Concern Present (02/10/2024)   [employer] of Occupational Health - Occupational Stress Questionnaire    Feeling of Stress: Not at all  Social Connections: Moderately Integrated (02/10/2024)   Social Connection and Isolation Panel    Frequency of Communication with Friends and Family: More than three times a week    Frequency of Social Gatherings with Friends and Family: More than three times a week  Attends Religious Services: More than 4 times per year    Active Member of Clubs or Organizations: Yes    Attends Banker Meetings: More than 4 times per year    Marital Status: Widowed    Tobacco Counseling Ready to quit: Not Answered Counseling given: Not Answered Tobacco comments: smkes 1-3 cigs a day     Clinical Intake:  Pre-visit preparation completed: Yes  Pain : 0-10 Pain Score: 5  Pain Type: Chronic pain Pain Location: Knee Pain Orientation: Left Pain Descriptors / Indicators: Aching Pain Onset: Today Pain Frequency: Several days a week     BMI - recorded: 24.86 Nutritional Status: BMI of 19-24  Normal Nutritional Risks: None Diabetes: No  Lab Results  Component Value Date   HGBA1C 6.3 (H) 08/17/2023   HGBA1C 6.3 (H) 12/20/2022   HGBA1C 6.2 (A) 02/18/2022     How often do you need to have someone help you when you read instructions, pamphlets, or other written materials from your doctor or pharmacy?: 1 - Never  Interpreter Needed?: No  Information entered by :: Chibuike Fleek,CMA   Activities of Daily Living     02/10/2024    8:43 AM  In your present state of health, do you have any difficulty performing the following activities:  Hearing? 0  Vision?  0  Difficulty concentrating or making decisions? 0  Walking or climbing stairs? 1  Dressing or bathing? 0  Doing errands, shopping? 0  Preparing Food and eating ? N  Using the Toilet? N  In the past six months, have you accidently leaked urine? N  Do you have problems with loss of bowel control? N  Managing your Medications? N  Managing your Finances? N  Housekeeping or managing your Housekeeping? N    Patient Care Team: Antonette Angeline ORN, NP as PCP - General (Internal Medicine) Kimmick, Truman Govern, MD as Referring Physician (Oncology) Perla Evalene PARAS, MD as Consulting Physician (Cardiology)  I have updated your Care Teams any recent Medical Services you may have received from other providers in the past year.     Assessment:   This is a routine wellness examination for Lauren Cervantes.  Hearing/Vision screen Hearing Screening - Comments:: No difficulties Vision Screening - Comments:: Patient wears glasses and contacts    Goals Addressed             This Visit's Progress    Patient Stated       To lose 10lb       Depression Screen     02/10/2024    8:44 AM 09/09/2023    7:57 AM 08/17/2023    9:42 AM 12/20/2022    9:27 AM 07/21/2022   10:34 AM 02/18/2022    8:48 AM 07/20/2021    9:04 AM  PHQ 2/9 Scores  PHQ - 2 Score 0 1 1 1  0 0 0  PHQ- 9 Score 0 4 4   2 1     Fall Risk     02/10/2024    8:42 AM 09/09/2023    7:57 AM 08/17/2023    9:42 AM 12/20/2022    9:27 AM 07/21/2022   10:34 AM  Fall Risk   Falls in the past year? 0 0 0 0 0  Number falls in past yr: 0      Injury with Fall? 0   0 0  Risk for fall due to : No Fall Risks   No Fall Risks No Fall Risks  Follow up Falls  evaluation completed        MEDICARE RISK AT HOME:  Medicare Risk at Home Any stairs in or around the home?: Yes If so, are there any without handrails?: No Home free of loose throw rugs in walkways, pet beds, electrical cords, etc?: Yes Adequate lighting in your home to reduce risk of  falls?: Yes Life alert?: No Use of a cane, walker or w/c?: No Grab bars in the bathroom?: Yes Shower chair or bench in shower?: Yes Elevated toilet seat or a handicapped toilet?: Yes  TIMED UP AND GO:  Was the test performed?  No  Cognitive Function: 6CIT completed        02/10/2024    8:44 AM  6CIT Screen  What Year? 0 points  What month? 0 points  What time? 0 points  Count back from 20 0 points  Months in reverse 0 points  Repeat phrase 0 points  Total Score 0 points    Immunizations Immunization History  Administered Date(s) Administered   Influenza Inj Mdck Quad Pf 01/27/2018   Influenza,inj,Quad PF,6+ Mos 02/18/2015, 02/06/2016, 02/03/2017, 01/19/2019   Influenza-Unspecified 01/29/2016   Moderna Sars-Covid-2 Vaccination 07/03/2019, 07/31/2019   Tdap 07/21/2022   Zoster Recombinant(Shingrix ) 06/01/2018, 10/06/2018    Screening Tests Health Maintenance  Topic Date Due   Influenza Vaccine  11/25/2023   Pneumococcal Vaccine: 50+ Years (1 of 2 - PCV) 08/16/2024 (Originally 01/12/1975)   Mammogram  11/01/2024   Medicare Annual Wellness (AWV)  02/09/2025   Fecal DNA (Cologuard)  01/24/2026   DTaP/Tdap/Td (2 - Td or Tdap) 07/20/2032   DEXA SCAN  Completed   Hepatitis C Screening  Completed   Zoster Vaccines- Shingrix   Completed   Meningococcal B Vaccine  Aged Out   Colonoscopy  Discontinued   COVID-19 Vaccine  Discontinued    Health Maintenance Items Addressed:patient declined   Additional Screening:  Vision Screening: Recommended annual ophthalmology exams for early detection of glaucoma and other disorders of the eye. Is the patient up to date with their annual eye exam?  No  Who is the provider or what is the name of the office in which the patient attends annual eye exams?   Dental Screening: Recommended annual dental exams for proper oral hygiene  Community Resource Referral / Chronic Care Management: CRR required this visit?  No   CCM required  this visit?  No   Plan:    I have personally reviewed and noted the following in the patient's chart:   Medical and social history Use of alcohol, tobacco or illicit drugs  Current medications and supplements including opioid prescriptions. Patient is not currently taking opioid prescriptions. Functional ability and status Nutritional status Physical activity Advanced directives List of other physicians Hospitalizations, surgeries, and ER visits in previous 12 months Vitals Screenings to include cognitive, depression, and falls Referrals and appointments  In addition, I have reviewed and discussed with patient certain preventive protocols, quality metrics, and best practice recommendations. A written personalized care plan for preventive services as well as general preventive health recommendations were provided to patient.   Lauren Cervantes, Lauren Cervantes   02/10/2024   After Visit Summary: (MyChart) Due to this being a telephonic visit, the after visit summary with patients personalized plan was offered to patient via MyChart   Notes: Nothing significant to report at this time.

## 2024-02-10 NOTE — Patient Instructions (Signed)
 Ms. Collings,  Thank you for taking the time for your Medicare Wellness Visit. I appreciate your continued commitment to your health goals. Please review the care plan we discussed, and feel free to reach out if I can assist you further.  Medicare recommends these wellness visits once per year to help you and your care team stay ahead of potential health issues. These visits are designed to focus on prevention, allowing your provider to concentrate on managing your acute and chronic conditions during your regular appointments.  Please note that Annual Wellness Visits do not include a physical exam. Some assessments may be limited, especially if the visit was conducted virtually. If needed, we may recommend a separate in-person follow-up with your provider.  Ongoing Care Seeing your primary care provider every 3 to 6 months helps us  monitor your health and provide consistent, personalized care.   Referrals If a referral was made during today's visit and you haven't received any updates within two weeks, please contact the referred provider directly to check on the status.  Recommended Screenings:  Health Maintenance  Topic Date Due   Flu Shot  11/25/2023   Pneumococcal Vaccine for age over 11 (1 of 2 - PCV) 08/16/2024*   Breast Cancer Screening  11/01/2024   Medicare Annual Wellness Visit  02/09/2025   Cologuard (Stool DNA test)  01/24/2026   DTaP/Tdap/Td vaccine (2 - Td or Tdap) 07/20/2032   DEXA scan (bone density measurement)  Completed   Hepatitis C Screening  Completed   Zoster (Shingles) Vaccine  Completed   Meningitis B Vaccine  Aged Out   Colon Cancer Screening  Discontinued   COVID-19 Vaccine  Discontinued  *Topic was postponed. The date shown is not the original due date.       02/10/2024    8:39 AM  Advanced Directives  Does Patient Have a Medical Advance Directive? No  Would patient like information on creating a medical advance directive? No - Patient declined    Advance Care Planning is important because it: Ensures you receive medical care that aligns with your values, goals, and preferences. Provides guidance to your family and loved ones, reducing the emotional burden of decision-making during critical moments.  Vision: Annual vision screenings are recommended for early detection of glaucoma, cataracts, and diabetic retinopathy. These exams can also reveal signs of chronic conditions such as diabetes and high blood pressure.  Dental: Annual dental screenings help detect early signs of oral cancer, gum disease, and other conditions linked to overall health, including heart disease and diabetes.  Please see the attached documents for additional preventive care recommendations.

## 2024-02-14 ENCOUNTER — Ambulatory Visit (INDEPENDENT_AMBULATORY_CARE_PROVIDER_SITE_OTHER): Payer: Self-pay | Admitting: Internal Medicine

## 2024-02-14 ENCOUNTER — Encounter: Payer: Self-pay | Admitting: Internal Medicine

## 2024-02-14 VITALS — BP 122/78 | Ht 66.0 in | Wt 158.0 lb

## 2024-02-14 DIAGNOSIS — I10 Essential (primary) hypertension: Secondary | ICD-10-CM

## 2024-02-14 DIAGNOSIS — Z6825 Body mass index (BMI) 25.0-25.9, adult: Secondary | ICD-10-CM

## 2024-02-14 DIAGNOSIS — M810 Age-related osteoporosis without current pathological fracture: Secondary | ICD-10-CM

## 2024-02-14 DIAGNOSIS — E782 Mixed hyperlipidemia: Secondary | ICD-10-CM | POA: Diagnosis not present

## 2024-02-14 DIAGNOSIS — Z853 Personal history of malignant neoplasm of breast: Secondary | ICD-10-CM

## 2024-02-14 DIAGNOSIS — E663 Overweight: Secondary | ICD-10-CM

## 2024-02-14 DIAGNOSIS — Z0001 Encounter for general adult medical examination with abnormal findings: Secondary | ICD-10-CM

## 2024-02-14 DIAGNOSIS — R7303 Prediabetes: Secondary | ICD-10-CM

## 2024-02-14 DIAGNOSIS — G43019 Migraine without aura, intractable, without status migrainosus: Secondary | ICD-10-CM | POA: Diagnosis not present

## 2024-02-14 DIAGNOSIS — B001 Herpesviral vesicular dermatitis: Secondary | ICD-10-CM

## 2024-02-14 MED ORDER — LOSARTAN POTASSIUM 100 MG PO TABS: 100.0000 mg | ORAL_TABLET | Freq: Every day | ORAL | 1 refills | Status: AC

## 2024-02-14 MED ORDER — VALACYCLOVIR HCL 1 G PO TABS: ORAL_TABLET | ORAL | 0 refills | Status: AC

## 2024-02-14 MED ORDER — ICOSAPENT ETHYL 1 G PO CAPS: 2.0000 g | ORAL_CAPSULE | Freq: Two times a day (BID) | ORAL | 1 refills | Status: AC

## 2024-02-14 MED ORDER — EZETIMIBE 10 MG PO TABS: ORAL_TABLET | ORAL | 1 refills | Status: AC

## 2024-02-14 MED ORDER — ROSUVASTATIN CALCIUM 20 MG PO TABS: ORAL_TABLET | ORAL | 1 refills | Status: AC

## 2024-02-14 MED ORDER — CARVEDILOL 25 MG PO TABS: ORAL_TABLET | ORAL | 1 refills | Status: AC

## 2024-02-14 NOTE — Assessment & Plan Note (Signed)
 Will discontinue ibandronate  150 mg monthly as she has been taking this >5 years Continue calcium  and vitamin D OTC Encouraged daily weightbearing exercise

## 2024-02-14 NOTE — Assessment & Plan Note (Signed)
 Lipid profile reviewed Encouraged her to consume a low-fat diet Continue rosuvastatin  40 mg daily, ezetimibe  10 mg daily and vascepa  2 g twice daily

## 2024-02-14 NOTE — Assessment & Plan Note (Signed)
Will check A1c at annual exam Encourage low-carb diet and exercise for weight loss

## 2024-02-14 NOTE — Assessment & Plan Note (Signed)
 Encourage stress reduction techniques Continue tylenol  OTC as needed

## 2024-02-14 NOTE — Assessment & Plan Note (Signed)
In remission Continue yearly mammograms 

## 2024-02-14 NOTE — Patient Instructions (Signed)

## 2024-02-14 NOTE — Progress Notes (Signed)
 Subjective:    Patient ID: Lauren Cervantes, female    DOB: 26-Dec-1955, 68 y.o.   MRN: [id number]  HPI  Patient presents to clinic today for 54-month follow-up of chronic conditions.  HTN: Her BP today is 122/78.  She is taking losartan  and carvedilol  as prescribed.  ECG from 06/2014 reviewed.  HLD: Her last LDL was 37, triglycerides 758, 07/2023.  She denies myalgias on rosuvastatin , ezetimibe  and vascepa  as prescribed.  She tries to consume a low-fat diet.  Migraines: These occur rarely, about 1 x year.  Triggered by stress.  She takes tylenol  as needed with some relief of symptoms.  She does not follow with neurology.  Prediabetes: Her last A1c was 6.3%, 07/2023.  She is not taking any oral diabetic medication at this time.  She does not check her sugars.  Osteoporosis: She is taking ibandronate  as prescribed.  She is taking calcium  and vitamin D as well.  She tries to get weightbearing exercise in.  Bone density from 05/2018 reviewed.  Genital herpes: She denies recent flare.  She takes valacyclovir  only as needed.  History of left breast cancer: Status postlumpectomy with subsequent lymphedema left arm.  She no longer follows with oncology.  Review of Systems     Past Medical History:  Diagnosis Date   Breast cancer (HCC) 2010   Hyperlipidemia    Hypertension    Lymphedema of arm    left arm.    Current Outpatient Medications  Medication Sig Dispense Refill   aspirin EC 81 MG tablet Take by mouth.     Calcium  Carb-Cholecalciferol (CALCIUM  1000 + D) 1000-800 MG-UNIT TABS Take 1 tablet by mouth daily. Vitamin D 400mg  90 tablet 2   carvedilol  (COREG ) 25 MG tablet TAKE 1 TABLET(25 MG) BY MOUTH TWICE DAILY WITH A MEAL 180 tablet 1   ezetimibe  (ZETIA ) 10 MG tablet TAKE 1 TABLET BY MOUTH EVERY DAY 90 tablet 1   ibandronate  (BONIVA ) 150 MG tablet TAKE 1 TABLET BY MOUTH EVERY 30 DAYS 3 tablet 1   icosapent  Ethyl (VASCEPA ) 1 g capsule TAKE 2 CAPSULES BY MOUTH 2 TIMES DAILY. 360  capsule 0   losartan  (COZAAR ) 100 MG tablet Take 1 tablet (100 mg total) by mouth daily. 90 tablet 1   Multiple Vitamins-Minerals (MULTIVITAMIN WITH MINERALS) tablet Take 1 tablet by mouth daily.     mupirocin  ointment (BACTROBAN ) 2 % Apply 1 Application topically 2 (two) times daily. For up to 7-10 days 22 g 0   predniSONE  (DELTASONE ) 10 MG tablet Take 6 tabs with breakfast Day 1, 5 tabs Day 2, 4 tabs Day 3, 3 tabs Day 4, 2 tabs Day 5, 1 tab Day 6. 21 tablet 0   rosuvastatin  (CRESTOR ) 20 MG tablet TAKE 1 TABLET BY MOUTH EVERYDAY AT BEDTIME 90 tablet 3   valACYclovir  (VALTREX ) 1000 MG tablet TAKE 2 TABLETS BY MOUTH 1 TIME AT START OF A COLD SORE. TAKE 1 TABLET DAILY UNTIL FEVER BILSTER IS GONE OR FOR TOTAL OF 5 DAYS 90 tablet 0   No current facility-administered medications for this visit.    Allergies  Allergen Reactions   Codeine Rash   Hydrocodone-Acetaminophen  Rash    Family History  Problem Relation Age of Onset   Hypertension Mother    Hypertension Father    Stroke Father    Cancer Other    Coronary artery disease Other    Stroke Other    Hyperlipidemia Other    Hypertension Other  Social History   Socioeconomic History   Marital status: Single    Spouse name: Not on file   Number of children: Not on file   Years of education: Not on file   Highest education level: Not on file  Occupational History   Occupation: Print production planner and Ins Agent  Tobacco Use   Smoking status: Every Day    Current packs/day: 0.05    Average packs/day: 0.1 packs/day for 30.0 years (1.5 ttl pk-yrs)    Types: Cigarettes   Smokeless tobacco: Never   Tobacco comments:    smkes 1-3 cigs a day   Vaping Use   Vaping status: Former  Substance and Sexual Activity   Alcohol use: Yes    Alcohol/week: 2.0 standard drinks of alcohol    Types: 1 Glasses of wine, 1 Standard drinks or equivalent per week    Comment: 1 a day most days   Drug use: No   Sexual activity: Not on file  Other  Topics Concern   Not on file  Social History Narrative   Regular exercise. Getting back into after cancer         Social Drivers of Health   Financial Resource Strain: Low Risk  (02/10/2024)   Overall Financial Resource Strain (CARDIA)    Difficulty of Paying Living Expenses: Not hard at all  Food Insecurity: No Food Insecurity (02/10/2024)   Hunger Vital Sign    Worried About Running Out of Food in the Last Year: Never true    Ran Out of Food in the Last Year: Never true  Transportation Needs: No Transportation Needs (02/10/2024)   PRAPARE - Administrator, Civil Service (Medical): No    Lack of Transportation (Non-Medical): No  Physical Activity: Insufficiently Active (02/10/2024)   Exercise Vital Sign    Days of Exercise per Week: 7 days    Minutes of Exercise per Session: 20 min  Stress: No Stress Concern Present (02/10/2024)   [employer] of Occupational Health - Occupational Stress Questionnaire    Feeling of Stress: Not at all  Social Connections: Moderately Integrated (02/10/2024)   Social Connection and Isolation Panel    Frequency of Communication with Friends and Family: More than three times a week    Frequency of Social Gatherings with Friends and Family: More than three times a week    Attends Religious Services: More than 4 times per year    Active Member of [employer] or Organizations: Yes    Attends Banker Meetings: More than 4 times per year    Marital Status: Widowed  Intimate Partner Violence: Not At Risk (02/10/2024)   Humiliation, Afraid, Rape, and Kick questionnaire    Fear of Current or Ex-Partner: No    Emotionally Abused: No    Physically Abused: No    Sexually Abused: No     Constitutional: Patient reports intermittent headaches.  Denies fever, malaise, fatigue, headache or abrupt weight changes.  HEENT: Denies eye pain, eye redness, ear pain, ringing in the ears, wax buildup, runny nose, nasal congestion, bloody nose,  or sore throat. Respiratory: Denies difficulty breathing, shortness of breath, cough or sputum production.   Cardiovascular: Denies chest pain, chest tightness, palpitations or swelling in the hands or feet.  Gastrointestinal: Denies abdominal pain, bloating, constipation, diarrhea or blood in the stool.  GU: Denies urgency, frequency, pain with urination, burning sensation, blood in urine, odor or discharge. Musculoskeletal: Denies decrease in range of motion, difficulty with gait, muscle pain  or joint pain and swelling.  Skin: Denies redness, rashes, lesions or ulcercations.  Neurological: Denies dizziness, difficulty with memory, difficulty with speech or problems with balance and coordination.  Psych: Denies anxiety, depression, SI/HI.  No other specific complaints in a complete review of systems (except as listed in HPI above).  Objective:   Physical Exam BP 122/78 (BP Location: Right Arm, Patient Position: Sitting, Cuff Size: Normal)   Ht 5' 6 (1.676 m)   Wt 158 lb (71.7 kg)   BMI 25.50 kg/m    Wt Readings from Last 3 Encounters:  02/10/24 154 lb (69.9 kg)  09/09/23 158 lb (71.7 kg)  08/17/23 158 lb 6.4 oz (71.8 kg)    General: Appears her stated age, overweight, in NAD. Skin: Warm, dry and intact.  HEENT: Head: normal shape and size; Eyes: sclera white, no icterus, conjunctiva pink, PERRLA and EOMs intact;  Cardiovascular: Normal rate and rhythm. S1,S2 noted.  No murmur, rubs or gallops noted. No JVD or BLE edema. No carotid bruits noted. Pulmonary/Chest: Normal effort and positive vesicular breath sounds. No respiratory distress. No wheezes, rales or ronchi noted.  Musculoskeletal:  No difficulty with gait.  Neurological: Alert and oriented. Coordination normal.  Psychiatric: Mood and affect normal. Behavior is normal. Judgment and thought content normal.    BMET    Component Value Date/Time   NA 142 08/17/2023 0952   NA 143 05/12/2017 0835   K 4.2 08/17/2023 0952    CL 105 08/17/2023 0952   CO2 26 08/17/2023 0952   GLUCOSE 104 (H) 08/17/2023 0952   BUN 14 08/17/2023 0952   BUN 15 05/12/2017 0835   CREATININE 0.59 08/17/2023 0952   CALCIUM  9.8 08/17/2023 0952   GFRNONAA 104 07/23/2019 0847   GFRAA 120 07/23/2019 0847    Lipid Panel     Component Value Date/Time   CHOL 104 08/17/2023 0952   CHOL 125 05/12/2017 0835   TRIG 241 (H) 08/17/2023 0952   HDL 37 (L) 08/17/2023 0952   HDL 35 (L) 05/12/2017 0835   CHOLHDL 2.8 08/17/2023 0952   VLDL NOT CALC mg/dL 88/71/7988 7879   LDLCALC 37 08/17/2023 0952    CBC    Component Value Date/Time   WBC 7.7 08/17/2023 0952   RBC 4.21 08/17/2023 0952   HGB 13.6 08/17/2023 0952   HGB 13.6 06/24/2016 0851   HCT 40.8 08/17/2023 0952   HCT 40.8 06/24/2016 0851   PLT 178 08/17/2023 0952   PLT 185 06/24/2016 0851   MCV 96.9 08/17/2023 0952   MCV 95 06/24/2016 0851   MCH 32.3 08/17/2023 0952   MCHC 33.3 08/17/2023 0952   RDW 13.2 08/17/2023 0952   RDW 14.5 06/24/2016 0851   LYMPHSABS 2,940 07/20/2021 0935   LYMPHSABS 2.4 06/24/2016 0851   EOSABS 118 07/20/2021 0935   EOSABS 0.1 06/24/2016 0851   BASOSABS 50 07/20/2021 0935   BASOSABS 0.0 06/24/2016 0851    Hgb A1C Lab Results  Component Value Date   HGBA1C 6.3 (H) 08/17/2023            Assessment & Plan:    RTC in 6 months for your annual exam Angeline Laura, NP

## 2024-02-14 NOTE — Assessment & Plan Note (Signed)
 Encourage diet and exercise for weight loss

## 2024-02-14 NOTE — Assessment & Plan Note (Signed)
 Continue valacyclovir  1000 mg daily x 5 days as needed for outbreaks

## 2024-02-14 NOTE — Assessment & Plan Note (Signed)
 Controlled on losartan  100 mg and carvedilol  25 mg twice daily Reinforced DASH diet and exercise for weight loss Kidney function reviewed

## 2024-08-17 ENCOUNTER — Encounter: Admitting: Internal Medicine
